# Patient Record
Sex: Female | Born: 1975 | Race: White | Hispanic: No | Marital: Married | Smoking: Never smoker
Health system: Southern US, Community
[De-identification: ages and names within clinical notes are randomized; demographics above are authoritative.]

## PROBLEM LIST (undated history)

## (undated) DIAGNOSIS — K529 Noninfective gastroenteritis and colitis, unspecified: Secondary | ICD-10-CM

## (undated) DIAGNOSIS — R079 Chest pain, unspecified: Secondary | ICD-10-CM

## (undated) DIAGNOSIS — F1210 Cannabis abuse, uncomplicated: Secondary | ICD-10-CM

## (undated) DIAGNOSIS — F410 Panic disorder [episodic paroxysmal anxiety] without agoraphobia: Secondary | ICD-10-CM

## (undated) DIAGNOSIS — I10 Essential (primary) hypertension: Secondary | ICD-10-CM

## (undated) DIAGNOSIS — E1169 Type 2 diabetes mellitus with other specified complication: Secondary | ICD-10-CM

## (undated) DIAGNOSIS — E785 Hyperlipidemia, unspecified: Secondary | ICD-10-CM

## (undated) HISTORY — PX: TUBAL LIGATION: SHX77

---

## 2004-02-08 ENCOUNTER — Ambulatory Visit: Payer: Self-pay | Admitting: Family Medicine

## 2004-07-01 ENCOUNTER — Encounter: Admission: RE | Admit: 2004-07-01 | Discharge: 2004-07-01 | Payer: Self-pay | Admitting: Obstetrics and Gynecology

## 2004-08-18 ENCOUNTER — Inpatient Hospital Stay (HOSPITAL_COMMUNITY): Admission: AD | Admit: 2004-08-18 | Discharge: 2004-08-18 | Payer: Self-pay | Admitting: Obstetrics and Gynecology

## 2004-08-28 ENCOUNTER — Inpatient Hospital Stay (HOSPITAL_COMMUNITY): Admission: AD | Admit: 2004-08-28 | Discharge: 2004-08-28 | Payer: Self-pay | Admitting: Obstetrics and Gynecology

## 2004-09-16 ENCOUNTER — Inpatient Hospital Stay (HOSPITAL_COMMUNITY): Admission: AD | Admit: 2004-09-16 | Discharge: 2004-09-18 | Payer: Self-pay | Admitting: Obstetrics and Gynecology

## 2004-10-16 ENCOUNTER — Other Ambulatory Visit: Admission: RE | Admit: 2004-10-16 | Discharge: 2004-10-16 | Payer: Self-pay | Admitting: Obstetrics and Gynecology

## 2004-10-21 ENCOUNTER — Ambulatory Visit (HOSPITAL_COMMUNITY): Admission: RE | Admit: 2004-10-21 | Discharge: 2004-10-21 | Payer: Self-pay | Admitting: Obstetrics and Gynecology

## 2008-06-19 ENCOUNTER — Emergency Department (HOSPITAL_COMMUNITY): Admission: EM | Admit: 2008-06-19 | Discharge: 2008-06-19 | Payer: Self-pay | Admitting: Emergency Medicine

## 2010-06-27 NOTE — H&P (Signed)
Cassandra Greer, Cassandra Greer             ACCOUNT NO.:  [id number]   MEDICAL RECORD NO.:  [id number]          PATIENT TYPE:  AMB   LOCATION:  SDC                           FACILITY:  [facility]   PHYSICIAN:  Huel Cote, M.D. DATE OF BIRTH:  [birth year]   DATE OF ADMISSION:  DATE OF DISCHARGE:                                HISTORY & PHYSICAL   DATE OF SURGERY:  October 21, 2004 at 9:30 a.m.   The patient is a 35 year old G5 P1-2-2-3 who is coming in for a scheduled  laparoscopic tubal sterilization given a desire for permanent sterility. The  patient recently in August had a spontaneous vaginal delivery and desired a  postpartum tubal ligation; however, had not signed the appropriate paperwork  for her Medicaid status and therefore is having this done as an interval  tubal ligation.   PAST MEDICAL HISTORY:  None.   PAST SURGICAL HISTORY:  None.   PAST GYNECOLOGICAL HISTORY:  None.   PAST OBSTETRICAL HISTORY:  She had an elective abortion in 1992. In 1997,  she had a vaginal delivery of a 6-pound 14-ounce infant. In 1998, she had a  vaginal delivery of a 6-pound 12-ounce infant. In 2005, she had an elective  abortion.   ALLERGIES:  None.   MEDICATIONS:  1.  Procardia XL 30 mg p.o. b.i.d.  2.  Celexa 20 mg p.o. daily.   PHYSICAL EXAMINATION:  VITAL SIGNS:  The patient's blood pressure is 138/90  on medication. She was placed on hypertensive medicines after delivery and  remains on Procardia XL 30 mg b.i.d. with adequate control of her blood  pressure, thus possible chronic borderline hypertension. Her weight is 192  pounds.  CARDIAC:  Regular rate and rhythm.  LUNGS:  Clear.  ABDOMEN:  Soft and nontender.  PELVIC:  She has normal genitalia noted. Cervix has no lesions. Uterus is  small and adnexa have no masses.   The patient was counseled as to the risks and benefits of proceeding with  tubal ligation including bleeding, infection, and possible damage to bowel  and bladder.  She understands the risk of failure of approximately 1 in 100  with this type of procedure, and understands the risks of ectopic pregnancy  is increased should she become pregnant. Given all of the above, the patient  has carefully considered and decides that she wishes to continue with  surgery.      Huel Cote, M.D.  Electronically Signed     KR/MEDQ  D:  10/17/2004  T:  10/17/2004  Job:  [id number]

## 2010-06-27 NOTE — Op Note (Signed)
NAMEALYN, Cassandra Greer             ACCOUNT NO.:  [id number]   MEDICAL RECORD NO.:  [id number]          PATIENT TYPE:  AMB   LOCATION:  SDC                           [facility]:  [facility]   PHYSICIAN:  Huel Cote, M.D. DATE OF BIRTH:  April 18, 1975   DATE OF PROCEDURE:  10/21/2004  DATE OF DISCHARGE:                                 OPERATIVE REPORT   PREOPERATIVE DIAGNOSIS:  Desires permanent sterility.   POSTOPERATIVE DIAGNOSIS:  Desires permanent sterility.   PROCEDURE:  Laparoscopic bilateral tubal ligation.   SURGEON:  Dr. Huel Cote.   ASSISTANT:  None.   ANESTHESIA:  General.   ESTIMATED BLOOD LOSS:  Minimal.   COMPLICATIONS:  None.   FINDINGS:  There was normal pelvic and abdominal anatomy noted at time of  surgery.   PROCEDURE:  The patient was taken to the operating room where general  anesthesia was obtained without difficulty. She was then prepped and draped  in the normal sterile fashion in dorsal lithotomy position. A speculum was  then placed within the vagina and the cervix identified and a Hulka  tenaculum placed within the cervix for uterine manipulation. Attention was  then turned to the patient's abdomen where a small infraumbilical incision  was made and this was injected with quarter percent Marcaine approximately  10 mL prior to incision. The Veress needle was then introduced into the  incision and intraperitoneal placement confirmed by both aspiration and  injection with normal saline. The gas flow was then applied and  pneumoperitoneum obtained with approximately to 2.5 liters CO2 gas. The  Veress needle was then removed and 10/11 trocar placed within the incision.  The camera was then introduced in the trocar and the abdomen and pelvis  carefully inspected. The tubes and ovaries appeared normal. The uterus was  normal appearing postpartum uterus. The appendix was not visualized. The  liver and gallbladder edge appeared normal and there were no  adhesions or  evidence of other pelvic pathology. At this point the tubes were cauterized  bilaterally with Kleppinger cautery and with each tube having approximately  2-3 cm segment of blanched tube noted. At this point the pelvis and abdomen  were hemostatic and the pneumoperitoneum was reduced through the 11 trocar  and the trocar removed under direct visualization. One deep suture of 0  Vicryl was placed in  the subcutaneous tissue and then the subcuticular tissue was closed with a 3-  0 Vicryl in a subcuticular stitch. Sponge, lap and needle counts were  correct x2 and the patient was taken to the recovery room after the Hulka  tenaculum was removed from her vagina in stable condition and awake.      Huel Cote, M.D.  Electronically Signed     KR/MEDQ  D:  10/21/2004  T:  10/21/2004  Job:  [id number]

## 2010-06-27 NOTE — Discharge Summary (Signed)
[name], Greer             ACCOUNT NO.:  [id number]   MEDICAL RECORD NO.:  [id number]          PATIENT TYPE:  INP   LOCATION:  9111                          [facility]ILITY:  [facility]   PHYSICIAN:  Huel Cote, M.D. DATE OF BIRTH:  12/28/1975   DATE OF ADMISSION:  09/16/2004  DATE OF DISCHARGE:                                 DISCHARGE SUMMARY   DISCHARGE DIAGNOSES:  1.  Term pregnancy at 40+ weeks delivered.  2.  Status post normal spontaneous vaginal delivery.  3.  Gestational diabetes.  4.  Some pregnancy-induced hypertension   DISCHARGE MEDICATIONS:  1.  Motrin 600 p.o. q.6h.  2.  Percocet one to two tablets p.o. q.4h. p.r.n.  3.  Procardia XL 30 mg p.o. b.i.d.   DISCHARGE FOLLOWUP:  The patient is to follow up in the office in  approximately 1 week for blood pressure check. She will also have a visiting  nurse at her home the day after discharge to check blood pressure.   HOSPITAL COURSE:  The patient is a 35 year old G5 P 0-2-2-2 who was admitted  at 69 and three-sevenths weeks for induction given a past due date. Prenatal  care been complicated by gestational diabetes, diet controlled, and some  borderline blood pressures with diastolics approaching 90. The patient did  have a history of possible borderline hypertension. She did have a history  of toxemia with her other two pregnancies and was delivered at 36 weeks  because of this. Prenatal labs were as follows:  A positive, Rh negative,  RPR nonreactive, rubella equivocal, hepatitis B surface antigen negative,  HIV declined, GC negative, chlamydia negative, group B strep negative. One-  hour Glucola 169; 3-hour was 100, 244, 139, and 91. Past obstetrical  history:  She had an elective abortion 1992. In 1997 she had normal  spontaneous vaginal delivery of a 6-pound 14-ounce infant. In 1998 she had a  vaginal delivery of a 6-pound 12-ounce infant. In 2005 she had an elective  abortion. Past gynecological history:  None.  Past medical history:  None.  Past surgical history:  None. Allergies:  None. Medications:  None. On  admission she was afebrile with stable vital signs. Fetal heart rate was  reactive. Blood pressures were 130 to 140 over 80s to 90s. Cervical exam was  75, 4, and a -2. She had rupture of membranes performed and clear fluid  noted. She then progressed quickly to complete dilation, pushed well, and  had a normal spontaneous vaginal delivery of a vigorous female infant over  intact perineum. Apgars were 8 and 9, weight was 7 pounds 3 ounces. Placenta  delivered spontaneously. She had a small periurethral laceration which was  repaired with 2-0 Vicryl in two interrupted sutures. The patient did request  a tubal ligation; however, had not signed the appropriate paperwork in  enough time. Therefore, this will be carried out as an outpatient procedure.  She was then admitted for routine postpartum care and her blood pressures  steadily  increased to diastolics of 90-100. At this point she was placed on Procardia  XL 30 mg b.i.d. and her  blood pressure responded well to this. By postpartum  day #2 in the morning she had diastolics more in the 80s and was stable for  discharge home. She was therefore discharged on her Procardia and also with  Motrin and Percocet for pain control.       KR/MEDQ  D:  09/18/2004  T:  09/18/2004  Job:  [id number]

## 2013-02-24 ENCOUNTER — Encounter (HOSPITAL_COMMUNITY): Payer: Self-pay | Admitting: Emergency Medicine

## 2013-02-24 ENCOUNTER — Emergency Department (HOSPITAL_COMMUNITY)
Admission: EM | Admit: 2013-02-24 | Discharge: 2013-02-24 | Disposition: A | Discharge status: Home / Self Care | Payer: Self-pay | Attending: Emergency Medicine | Admitting: Emergency Medicine

## 2013-02-24 DIAGNOSIS — H6092 Unspecified otitis externa, left ear: Secondary | ICD-10-CM

## 2013-02-24 DIAGNOSIS — J329 Chronic sinusitis, unspecified: Secondary | ICD-10-CM

## 2013-02-24 DIAGNOSIS — J019 Acute sinusitis, unspecified: Secondary | ICD-10-CM | POA: Insufficient documentation

## 2013-02-24 DIAGNOSIS — J3489 Other specified disorders of nose and nasal sinuses: Secondary | ICD-10-CM | POA: Insufficient documentation

## 2013-02-24 DIAGNOSIS — H60399 Other infective otitis externa, unspecified ear: Secondary | ICD-10-CM | POA: Insufficient documentation

## 2013-02-24 MED ORDER — IBUPROFEN 800 MG PO TABS
800.0000 mg | ORAL_TABLET | Freq: Once | ORAL | Status: AC
  Administered 2013-02-24: 800 mg via ORAL
  Filled 2013-02-24: qty 1

## 2013-02-24 MED ORDER — CIPROFLOXACIN-HYDROCORTISONE 0.2-1 % OT SUSP: 3.0000 [drp] | Freq: Two times a day (BID) | OTIC | Status: DC

## 2013-02-24 MED ORDER — CIPROFLOXACIN-DEXAMETHASONE 0.3-0.1 % OT SUSP
4.0000 [drp] | Freq: Once | OTIC | Status: AC
Start: 2013-02-24 — End: 2013-02-24
  Administered 2013-02-24: 4 [drp] via OTIC
  Filled 2013-02-24: qty 7.5

## 2013-02-24 MED ORDER — ANTIPYRINE-BENZOCAINE 5.4-1.4 % OT SOLN
3.0000 [drp] | Freq: Once | OTIC | Status: AC
  Administered 2013-02-24: 3 [drp] via OTIC
  Filled 2013-02-24: qty 10

## 2013-02-24 MED ORDER — AMOXICILLIN-POT CLAVULANATE 875-125 MG PO TABS: 1.0000 | ORAL_TABLET | Freq: Two times a day (BID) | ORAL | Status: DC

## 2013-02-24 MED ORDER — IBUPROFEN 800 MG PO TABS: 800.0000 mg | ORAL_TABLET | Freq: Three times a day (TID) | ORAL | Status: DC

## 2013-02-24 NOTE — Discharge Instructions (Signed)
Otitis Externa Otitis externa is a bacterial or fungal infection of the outer ear canal. This is the area from the eardrum to the outside of the ear. Otitis externa is sometimes called "swimmer's ear." CAUSES  Possible causes of infection include:  Swimming in dirty water.  Moisture remaining in the ear after swimming or bathing.  Mild injury (trauma) to the ear.  Objects stuck in the ear (foreign body).  Cuts or scrapes (abrasions) on the outside of the ear. SYMPTOMS  The first symptom of infection is often itching in the ear canal. Later signs and symptoms may include swelling and redness of the ear canal, ear pain, and yellowish-white fluid (pus) coming from the ear. The ear pain may be worse when pulling on the earlobe. DIAGNOSIS  Your caregiver will perform a physical exam. A sample of fluid may be taken from the ear and examined for bacteria or fungi. TREATMENT  Antibiotic ear drops are often given for 10 to 14 days. Treatment may also include pain medicine or corticosteroids to reduce itching and swelling. PREVENTION   Keep your ear dry. Use the corner of a towel to absorb water out of the ear canal after swimming or bathing.  Avoid scratching or putting objects inside your ear. This can damage the ear canal or remove the protective wax that lines the canal. This makes it easier for bacteria and fungi to grow.  Avoid swimming in lakes, polluted water, or poorly chlorinated pools.  You may use ear drops made of rubbing alcohol and vinegar after swimming. Combine equal parts of white vinegar and alcohol in a bottle. Put 3 or 4 drops into each ear after swimming. HOME CARE INSTRUCTIONS   Apply antibiotic ear drops to the ear canal as prescribed by your caregiver.  Only take over-the-counter or prescription medicines for pain, discomfort, or fever as directed by your caregiver.  If you have diabetes, follow any additional treatment instructions from your caregiver.  Keep all  follow-up appointments as directed by your caregiver. SEEK MEDICAL CARE IF:   You have a fever.  Your ear is still red, swollen, painful, or draining pus after 3 days.  Your redness, swelling, or pain gets worse.  You have a severe headache.  You have redness, swelling, pain, or tenderness in the area behind your ear. MAKE SURE YOU:   Understand these instructions.  Will watch your condition.  Will get help right away if you are not doing well or get worse. Document Released: 01/26/2005 Document Revised: 04/20/2011 Document Reviewed: 02/12/2011 [employer] Patient Information 2014 [employer].  

## 2013-02-24 NOTE — ED Provider Notes (Signed)
CSN: [id number]     Arrival date & time 02/24/13  0016 History   First MD Initiated Contact with Patient 02/24/13 0431     Chief Complaint  Patient presents with  . Otalgia   (Consider location/radiation/quality/duration/timing/severity/associated sxs/prior Treatment) HPI History provided by patient. This had some sinus congestion for the last 2 days and tonight at home developed severe left ear pain. Hurts to touch her ear. No drainage. No fevers. She does complain of some chills tonight. No sore throat. No neck pain or neck stiffness. Symptoms moderate severity  History reviewed. No pertinent past medical history. History reviewed. No pertinent past surgical history. History reviewed. No pertinent family history. History  Substance Use Topics  . Smoking status: Never Smoker   . Smokeless tobacco: Never Used  . Alcohol Use: No   OB History   Grav Para Term Preterm Abortions TAB SAB Ect Mult Living                 Review of Systems  Constitutional: Positive for chills.  HENT: Positive for congestion and ear pain.   Eyes: Negative for visual disturbance.  Respiratory: Negative for shortness of breath.   Cardiovascular: Negative for chest pain.  Gastrointestinal: Negative for abdominal pain.  Genitourinary: Negative for dysuria.  Musculoskeletal: Negative for neck pain and neck stiffness.  Skin: Negative for rash.  Neurological: Negative for headaches.  All other systems reviewed and are negative.    Allergies  Review of patient's allergies indicates no known allergies.  Home Medications   Current Outpatient Rx  Name  Route  Sig  Dispense  Refill  . phenylephrine (SUDAFED PE) 10 MG TABS tablet   Oral   Take 10 mg by mouth every 4 (four) hours as needed (congestion).         . SALINE NA   Left Nare   Place 1 spray into left nostril daily as needed (congestion).          BP 179/128  Pulse 80  Temp(Src) 97.6 F (36.4 C) (Oral)  Resp 18  Ht 5\' 7"  (1.702 m)   Wt 220 lb (99.791 kg)  BMI 34.45 kg/m2  SpO2 96%  LMP 02/24/2013 Physical Exam  Constitutional: She is oriented to person, place, and time. She appears well-developed and well-nourished.  HENT:  Head: Normocephalic and atraumatic.  Mouth/Throat: Oropharynx is clear and moist.  Left ear canal with swelling erythema, tenderness with any manipulation of the external ear. No obvious drainage, unable to visualize TM. Nasal congestion.   Eyes: EOM are normal. Pupils are equal, round, and reactive to light.  Neck: Normal range of motion. Neck supple.  Cardiovascular: Normal rate, regular rhythm and intact distal pulses.   Pulmonary/Chest: Effort normal and breath sounds normal. No stridor. No respiratory distress.  Abdominal: Soft. Bowel sounds are normal. She exhibits no distension. There is no tenderness.  Musculoskeletal: Normal range of motion. She exhibits no edema.  Neurological: She is alert and oriented to person, place, and time.  Skin: Skin is warm and dry.    ED Course  Procedures (including critical care time) Labs Review Labs Reviewed - No data to display Imaging Review No results found.  EKG Interpretation   None      Auralgan and Cipro otic provided.  Clinical otitis externa unable to visualize TM possible otitis media. Will treat for associated sinusitis with Augmentin.   Plan discharge home with antibiotics. Return precautions verbalizes understood.  MDM  Diagnosis: Otitis externa, sinusitis  Treated with medications as above. Vital signs nurses notes reviewed and considered   Teressa Lower, MD 02/24/13 [phone]

## 2013-02-24 NOTE — ED Notes (Addendum)
Pt c/o sore throat, left sided earache radiating to left face, cough. Left sided Facial numbness. No neuro deficits noted. No arm drift, slurred speech or facial droop

## 2013-03-05 ENCOUNTER — Inpatient Hospital Stay (HOSPITAL_COMMUNITY)
Admission: EM | Admit: 2013-03-05 | Discharge: 2013-03-10 | DRG: 392 | Disposition: A | Discharge status: Home / Self Care | Payer: Medicaid Other | Attending: Internal Medicine | Admitting: Internal Medicine

## 2013-03-05 ENCOUNTER — Emergency Department (HOSPITAL_COMMUNITY): Payer: Medicaid Other

## 2013-03-05 ENCOUNTER — Encounter (HOSPITAL_COMMUNITY): Payer: Self-pay | Admitting: Emergency Medicine

## 2013-03-05 DIAGNOSIS — R51 Headache: Secondary | ICD-10-CM

## 2013-03-05 DIAGNOSIS — K625 Hemorrhage of anus and rectum: Secondary | ICD-10-CM

## 2013-03-05 DIAGNOSIS — R519 Headache, unspecified: Secondary | ICD-10-CM

## 2013-03-05 DIAGNOSIS — K5289 Other specified noninfective gastroenteritis and colitis: Secondary | ICD-10-CM

## 2013-03-05 DIAGNOSIS — A09 Infectious gastroenteritis and colitis, unspecified: Principal | ICD-10-CM | POA: Diagnosis present

## 2013-03-05 DIAGNOSIS — R109 Unspecified abdominal pain: Secondary | ICD-10-CM

## 2013-03-05 DIAGNOSIS — Z6833 Body mass index (BMI) 33.0-33.9, adult: Secondary | ICD-10-CM

## 2013-03-05 DIAGNOSIS — E876 Hypokalemia: Secondary | ICD-10-CM

## 2013-03-05 DIAGNOSIS — Z833 Family history of diabetes mellitus: Secondary | ICD-10-CM

## 2013-03-05 DIAGNOSIS — K529 Noninfective gastroenteritis and colitis, unspecified: Secondary | ICD-10-CM | POA: Diagnosis present

## 2013-03-05 DIAGNOSIS — I10 Essential (primary) hypertension: Secondary | ICD-10-CM

## 2013-03-05 DIAGNOSIS — E8881 Metabolic syndrome: Secondary | ICD-10-CM | POA: Diagnosis present

## 2013-03-05 DIAGNOSIS — D72829 Elevated white blood cell count, unspecified: Secondary | ICD-10-CM

## 2013-03-05 DIAGNOSIS — J329 Chronic sinusitis, unspecified: Secondary | ICD-10-CM

## 2013-03-05 HISTORY — DX: Noninfective gastroenteritis and colitis, unspecified: K52.9

## 2013-03-05 LAB — TYPE AND SCREEN
ABO/RH(D): A POS
ANTIBODY SCREEN: NEGATIVE

## 2013-03-05 LAB — OCCULT BLOOD, POC DEVICE: FECAL OCCULT BLD: POSITIVE — AB

## 2013-03-05 LAB — DIFFERENTIAL
BASOS ABS: 0 10*3/uL (ref 0.0–0.1)
Basophils Relative: 0 % (ref 0–1)
EOS PCT: 0 % (ref 0–5)
Eosinophils Absolute: 0 10*3/uL (ref 0.0–0.7)
LYMPHS ABS: 2.8 10*3/uL (ref 0.7–4.0)
LYMPHS PCT: 11 % — AB (ref 12–46)
Monocytes Absolute: 1.1 10*3/uL — ABNORMAL HIGH (ref 0.1–1.0)
Monocytes Relative: 4 % (ref 3–12)
NEUTROS ABS: 20.5 10*3/uL — AB (ref 1.7–7.7)
NEUTROS PCT: 84 % — AB (ref 43–77)

## 2013-03-05 LAB — CBC
HCT: 36.6 % (ref 36.0–46.0)
HEMATOCRIT: 41.3 % (ref 36.0–46.0)
HEMOGLOBIN: 12.4 g/dL (ref 12.0–15.0)
Hemoglobin: 14.2 g/dL (ref 12.0–15.0)
MCH: 30.1 pg (ref 26.0–34.0)
MCH: 30.2 pg (ref 26.0–34.0)
MCHC: 34.4 g/dL (ref 30.0–36.0)
MCHC: 33.9 g/dL (ref 30.0–36.0)
MCV: 87.7 fL (ref 78.0–100.0)
MCV: 89.1 fL (ref 78.0–100.0)
PLATELETS: 480 10*3/uL — AB (ref 150–400)
Platelets: 351 10*3/uL (ref 150–400)
RBC: 4.71 MIL/uL (ref 3.87–5.11)
RBC: 4.11 MIL/uL (ref 3.87–5.11)
RDW: 12.9 % (ref 11.5–15.5)
RDW: 12.9 % (ref 11.5–15.5)
WBC: 24.5 10*3/uL — AB (ref 4.0–10.5)
WBC: 19.1 10*3/uL — ABNORMAL HIGH (ref 4.0–10.5)

## 2013-03-05 LAB — COMPREHENSIVE METABOLIC PANEL
ALBUMIN: 4 g/dL (ref 3.5–5.2)
ALT: 12 U/L (ref 0–35)
AST: 12 U/L (ref 0–37)
Alkaline Phosphatase: 93 U/L (ref 39–117)
BUN: 8 mg/dL (ref 6–23)
CHLORIDE: 98 meq/L (ref 96–112)
CO2: 26 mEq/L (ref 19–32)
CREATININE: 0.69 mg/dL (ref 0.50–1.10)
Calcium: 9.3 mg/dL (ref 8.4–10.5)
GFR calc Af Amer: >90 mL/min (ref >90)
GFR calc non Af Amer: >90 mL/min (ref >90)
Glucose, Bld: 96 mg/dL (ref 70–99)
Potassium: 3.6 mEq/L — ABNORMAL LOW (ref 3.7–5.3)
Sodium: 139 mEq/L (ref 137–147)
TOTAL PROTEIN: 7.8 g/dL (ref 6.0–8.3)
Total Bilirubin: 0.3 mg/dL (ref 0.3–1.2)

## 2013-03-05 LAB — LACTIC ACID, PLASMA: Lactic Acid, Venous: 1.4 mmol/L (ref 0.5–2.2)

## 2013-03-05 LAB — URINALYSIS, ROUTINE W REFLEX MICROSCOPIC
Bilirubin Urine: NEGATIVE
GLUCOSE, UA: NEGATIVE mg/dL
KETONES UR: NEGATIVE mg/dL
LEUKOCYTES UA: NEGATIVE
NITRITE: NEGATIVE
PH: 7 (ref 5.0–8.0)
Protein, ur: 100 mg/dL — AB
SPECIFIC GRAVITY, URINE: 1.018 (ref 1.005–1.030)
Urobilinogen, UA: 0.2 mg/dL (ref 0.0–1.0)

## 2013-03-05 LAB — URINE MICROSCOPIC-ADD ON

## 2013-03-05 LAB — POCT PREGNANCY, URINE: PREG TEST UR: NEGATIVE

## 2013-03-05 MED ORDER — ONDANSETRON HCL 4 MG/2ML IJ SOLN
4.0000 mg | Freq: Once | INTRAMUSCULAR | Status: AC
  Administered 2013-03-05: 4 mg via INTRAVENOUS
  Filled 2013-03-05: qty 2

## 2013-03-05 MED ORDER — SODIUM CHLORIDE 0.9 % IV SOLN
Freq: Once | INTRAVENOUS | Status: AC
  Administered 2013-03-05: 20:00:00 via INTRAVENOUS

## 2013-03-05 MED ORDER — HYDROMORPHONE HCL PF 1 MG/ML IJ SOLN
1.0000 mg | Freq: Once | INTRAMUSCULAR | Status: AC
Start: 2013-03-05 — End: 2013-03-05
  Administered 2013-03-05: 1 mg via INTRAVENOUS
  Filled 2013-03-05: qty 1

## 2013-03-05 MED ORDER — ACETAMINOPHEN 325 MG PO TABS: 650.0000 mg | ORAL_TABLET | Freq: Four times a day (QID) | ORAL | Status: DC | PRN

## 2013-03-05 MED ORDER — SODIUM CHLORIDE 0.9 % IV SOLN
80.0000 mg | Freq: Once | INTRAVENOUS | Status: AC
  Administered 2013-03-05: 80 mg via INTRAVENOUS
  Filled 2013-03-05: qty 80

## 2013-03-05 MED ORDER — SODIUM CHLORIDE 0.9 % IV BOLUS (SEPSIS)
1000.0000 mL | Freq: Once | INTRAVENOUS | Status: AC
  Administered 2013-03-05: 1000 mL via INTRAVENOUS

## 2013-03-05 MED ORDER — SODIUM CHLORIDE 0.9 % IV SOLN
INTRAVENOUS | Status: AC
  Administered 2013-03-06: 06:00:00 via INTRAVENOUS

## 2013-03-05 MED ORDER — IOHEXOL 300 MG/ML  SOLN
25.0000 mL | Freq: Once | INTRAMUSCULAR | Status: AC | PRN
  Administered 2013-03-05: 25 mL via ORAL

## 2013-03-05 MED ORDER — CIPROFLOXACIN IN D5W 400 MG/200ML IV SOLN
400.0000 mg | Freq: Two times a day (BID) | INTRAVENOUS | Status: DC
  Administered 2013-03-06 – 2013-03-10 (×9): 400 mg via INTRAVENOUS
  Filled 2013-03-05 (×11): qty 200

## 2013-03-05 MED ORDER — ONDANSETRON HCL 4 MG PO TABS: 4.0000 mg | ORAL_TABLET | Freq: Four times a day (QID) | ORAL | Status: DC | PRN

## 2013-03-05 MED ORDER — HYDROMORPHONE HCL PF 1 MG/ML IJ SOLN
1.0000 mg | Freq: Once | INTRAMUSCULAR | Status: AC
  Administered 2013-03-05: 1 mg via INTRAVENOUS
  Filled 2013-03-05: qty 1

## 2013-03-05 MED ORDER — ONDANSETRON HCL 4 MG/2ML IJ SOLN
INTRAMUSCULAR | Status: AC
  Administered 2013-03-05: 4 mg
  Filled 2013-03-05: qty 2

## 2013-03-05 MED ORDER — ONDANSETRON HCL 4 MG/2ML IJ SOLN
4.0000 mg | Freq: Four times a day (QID) | INTRAMUSCULAR | Status: DC | PRN
  Administered 2013-03-06 (×2): 4 mg via INTRAVENOUS
  Filled 2013-03-05 (×2): qty 2

## 2013-03-05 MED ORDER — METRONIDAZOLE IN NACL 5-0.79 MG/ML-% IV SOLN
500.0000 mg | Freq: Three times a day (TID) | INTRAVENOUS | Status: DC
  Administered 2013-03-05 – 2013-03-10 (×14): 500 mg via INTRAVENOUS
  Filled 2013-03-05 (×17): qty 100

## 2013-03-05 MED ORDER — CIPROFLOXACIN IN D5W 400 MG/200ML IV SOLN
400.0000 mg | Freq: Once | INTRAVENOUS | Status: AC
  Administered 2013-03-05: 400 mg via INTRAVENOUS
  Filled 2013-03-05: qty 200

## 2013-03-05 MED ORDER — IOHEXOL 300 MG/ML  SOLN
100.0000 mL | Freq: Once | INTRAMUSCULAR | Status: AC | PRN
  Administered 2013-03-05: 100 mL via INTRAVENOUS

## 2013-03-05 MED ORDER — ACETAMINOPHEN 650 MG RE SUPP: 650.0000 mg | Freq: Four times a day (QID) | RECTAL | Status: DC | PRN

## 2013-03-05 MED ORDER — METRONIDAZOLE IN NACL 5-0.79 MG/ML-% IV SOLN: 500.0000 mg | Freq: Once | INTRAVENOUS | Status: DC

## 2013-03-05 MED ORDER — HYDROMORPHONE HCL PF 1 MG/ML IJ SOLN
0.5000 mg | INTRAMUSCULAR | Status: DC | PRN
  Administered 2013-03-05 – 2013-03-08 (×16): 0.5 mg via INTRAVENOUS
  Filled 2013-03-05 (×16): qty 1

## 2013-03-05 NOTE — H&P (Signed)
[facility] History and Physical  Ozelle Brubacher [facility]:[id number] DOB: 05/11/1975 DOA: 03/05/2013  Referring physician: ER physician. PCP: No PCP Per Patient   Chief Complaint: Rectal bleeding and abdominal pain.  HPI: Cassandra Greer is a 38 y.o. female with no significant past medical history presented the ER because of rectal bleeding. Patient has had multiple bouts of bowel movements which was frankly bloody since last night. Patient also has been having crampy abdominal pain and with nausea vomiting. Patient 3 days ago had taken 2 doses of Augmentin for sinusitis. Patient did not have further doses as patient was not able to afford it. In the ER CAT scan of the abdomen shows features concerning for colitis. Patient also has significant leukocytosis. Patient otherwise is afebrile. Patient has been placed on Cipro and Flagyl and admitted for further management. Patient denies having any recent travels or come in contact with patients with diarrhea. Denies having any hamburgers recently. Has not had any previous episodes of bloody diarrhea. Denies any chest pain or shortness of breath. Patient has been having features of sinusitis and upper respiratory tract infection like symptoms for last 2 weeks. Still has running nose.  Review of Systems: As presented in the history of presenting illness, rest negative.  Past Medical History  Diagnosis Date  . Medical history non-contributory    Past Surgical History  Procedure Laterality Date  . Tubal ligation     Social History:  reports that she has never smoked. She has never used smokeless tobacco. She reports that she does not drink alcohol or use illicit drugs. Where does patient live home. Can patient participate in ADLs? Yes.  No Known Allergies  Family History:  Family History  Problem Relation Age of Onset  . Hypertension Other   . Diabetes Mellitus II Other       Prior to Admission medications   Medication Sig  Start Date End Date Taking? Authorizing Provider  amoxicillin-clavulanate (AUGMENTIN) 875-125 MG per tablet Take 1 tablet by mouth 2 (two) times daily. 02/24/13   Teressa Lower, MD  ciprofloxacin-hydrocortisone (CIPRO [facility]) otic suspension Place 3 drops into the left ear 2 (two) times daily. 02/24/13   Teressa Lower, MD  ibuprofen (ADVIL,MOTRIN) 800 MG tablet Take 1 tablet (800 mg total) by mouth 3 (three) times daily. 02/24/13   Teressa Lower, MD    Physical Exam: Filed Vitals:   03/05/13 1519 03/05/13 1623 03/05/13 1742 03/05/13 1950  BP: 170/120 161/100 146/100 137/81  Pulse: 117     Temp: 98 F (36.7 C)     Resp: 16 17 17 18   Height: 5\' 7"  (1.702 m)     Weight: 92.704 kg (204 lb 6 oz)     SpO2: 96% 96% 99% 99%     General:  Well-developed well-nourished.  Eyes: Anicteric no pallor.  ENT: No discharge from ears eyes nose mouth.  Neck: No mass felt.  Cardiovascular: S1-S2 heard.  Respiratory: No rhonchi or crepitations.  Abdomen: Soft nontender bowel sounds present. No guarding no rigidity.  Skin: No rash.  Musculoskeletal: No edema.  Psychiatric: Appears normal.  Neurologic: Alert awake oriented to time place and person. Moves all extremities.  Labs on Admission:  Basic Metabolic Panel:  Recent Labs Lab 03/05/13 1521  NA 139  K 3.6*  CL 98  CO2 26  GLUCOSE 96  BUN 8  CREATININE 0.69  CALCIUM 9.3   Liver Function Tests:  Recent Labs Lab 03/05/13 1521  AST 12  ALT 12  ALKPHOS 93  BILITOT 0.3  PROT 7.8  ALBUMIN 4.0   No results found for this basename: LIPASE, AMYLASE,  in the last 168 hours No results found for this basename: AMMONIA,  in the last 168 hours CBC:  Recent Labs Lab 03/05/13 1521  WBC 24.5*  NEUTROABS 20.5*  HGB 14.2  HCT 41.3  MCV 87.7  PLT 480*   Cardiac Enzymes: No results found for this basename: CKTOTAL, CKMB, CKMBINDEX, TROPONINI,  in the last 168 hours  BNP (last 3 results) No results found for this basename: PROBNP,  in  the last 8760 hours CBG: No results found for this basename: GLUCAP,  in the last 168 hours  Radiological Exams on Admission: Ct Abdomen Pelvis W Contrast  03/05/2013   CLINICAL DATA:  Abdominal pain, nausea and vomiting. Blood in stool per patient.  EXAM: CT ABDOMEN AND PELVIS WITH CONTRAST  TECHNIQUE: Multidetector CT imaging of the abdomen and pelvis was performed using the standard protocol following bolus administration of intravenous contrast.  CONTRAST:  132mL OMNIPAQUE IOHEXOL 300 MG/ML  SOLN  COMPARISON:  CT-ABD-PELV dated 12/08/2005 at [facility]  FINDINGS: 2 mm incidental right middle lobe pulmonary parenchymal nodule versus vessel seen on end incidentally noted on image 8. No further followup is needed for this benign appearing finding. The left lung base is clear.  Liver, gallbladder, adrenal glands, right kidney, spleen, and pancreas are normal in appearance. 9 mm left lower renal pole cortical hypodense lesion is reidentified image 41, not completely characterized but statistically most likely assessed. No ascites or lymphadenopathy.  Uterus and ovaries are normal. Normal appendix. The cecum and transverse colon appear normal. The descending colon is collapsed, but demonstrates diffuse mural edema and trace surrounding stranding, but no surrounding fluid collection or air. For example, see image 56. No ascites, free air, or lymphadenopathy. The rectum is normal in appearance. Bladder is normal. No radiopaque renal or ureteral calculus.  No acute osseous abnormality.  IMPRESSION: Long segmental descending colonic wall thickening and edema, with surrounding trace stranding. These findings are most compatible with colitis, which could represent inflammatory bowel disease such as ulcerative colitis, infectious or inflammatory colitis, and much less likely ischemia but possible given the distribution. No evidence for perforation or pericolonic abscess.   Electronically Signed   By:  Conchita Paris M.D.   On: 03/05/2013 18:53    Assessment/Plan Principal Problem:   Colitis Active Problems:   Leucocytosis   Sinusitis   1. Colitis - infectious versus inflammatory. Stool for C. difficile PCR cultures are been ordered. For now patient will be placed on IV Cipro and Flagyl. If patient turns out to be C. difficile positive then patient may be treated on oral vancomycin given significant leukocytosis. Continue with hydration. Closely follow CBC secondary to bloody diarrhea. Closely follow renal function. Patient's blood work does not show any thrombocytopenia. I have also place patient on when necessary pain relief medications. Ibuprofen is mention in patient's medication list though patient denies having taken any NSAIDs. 2. Leukocytosis - probably secondary to colitis. Closely follow CBC with differentials. See #1. 3. Sinusitis - since patient has been having upper respiratory tract infection-like symptoms I have ordered includes a PCR.    Code Status: Full code.  Family Communication: None.  Disposition Plan: Admit to inpatient.    Wilfrido Luedke N. [facility] Pager [phone].  If 7PM-7AM, please contact night-coverage [email or web address] Password [facility] 03/05/2013, 8:37 PM

## 2013-03-05 NOTE — ED Notes (Signed)
Pt here from home with c/o abd pain and along with n/v and 5 episodes of passing large clots in her stool

## 2013-03-05 NOTE — ED Notes (Signed)
CT paged. 

## 2013-03-05 NOTE — ED Notes (Signed)
Patient transported to CT 

## 2013-03-05 NOTE — ED Provider Notes (Signed)
CSN: [id number]     Arrival date & time 03/05/13  1510 History   First MD Initiated Contact with Patient 03/05/13 1530     Chief Complaint  Patient presents with  . Rectal Bleeding   (Consider location/radiation/quality/duration/timing/severity/associated sxs/prior Treatment) Patient is a 38 y.o. female presenting with hematochezia.  Rectal Bleeding  38 yo female presents with acute onset lower abdominal pain that started last night around 11pm . Patient states pain woke her from her sleep. Pain described as sharp, intermittent pain that "comes in waves". Pain is 10/10. Patient states she had associated N/V x 4-5 episodes with 5 episodes of passing "dark red blood clots' per rectum. Patient states she had some pizza last night before bed. Admits to a hx of hemorrhoids in past but denies any rectal pain currently. Patient admits to taking IBuprofen 800mg  over the past week. Denies any chronic medications. Admits to hx of heartburn/reflux but has never been diagnosed. Patient denies hx of PUD. Denies any abdominal surgeries. Does not smoke or drink. LMP was 02/24/13. Past Medical History  Diagnosis Date  . Medical history non-contributory    Past Surgical History  Procedure Laterality Date  . Tubal ligation     Family History  Problem Relation Age of Onset  . Hypertension Other   . Diabetes Mellitus II Other    History  Substance Use Topics  . Smoking status: Never Smoker   . Smokeless tobacco: Never Used  . Alcohol Use: No   OB History   Grav Para Term Preterm Abortions TAB SAB Ect Mult Living                 Review of Systems  Gastrointestinal: Positive for hematochezia. Negative for diarrhea and constipation.  Musculoskeletal: Positive for back pain.  All other systems reviewed and are negative.    Allergies  Review of patient's allergies indicates no known allergies.  Home Medications   No current outpatient prescriptions on file. BP 148/90  Pulse 60  Temp(Src)  98.4 F (36.9 C) (Oral)  Resp 18  Ht 5\' 7"  (1.702 m)  Wt 213 lb 9.6 oz (96.888 kg)  BMI 33.45 kg/m2  SpO2 98%  LMP 02/24/2013 Physical Exam  Nursing note and vitals reviewed. Constitutional: She is oriented to person, place, and time. She appears well-developed and well-nourished. No distress.  HENT:  Head: Normocephalic and atraumatic.  Eyes: Conjunctivae and EOM are normal.  Cardiovascular: Normal rate and regular rhythm.  Exam reveals no gallop and no friction rub.   No murmur heard. Pulmonary/Chest: Effort normal and breath sounds normal. No respiratory distress. She has no wheezes. She has no rales.  Abdominal: Soft. Bowel sounds are normal. She exhibits no distension. There is no hepatosplenomegaly. There is generalized tenderness (worse with palpation of lower abdomen. ). There is no rigidity, no rebound, no guarding, no tenderness at McBurney's point and negative Murphy's sign.  Genitourinary: Rectal exam shows no external hemorrhoid, no internal hemorrhoid, no mass and anal tone normal. Guaiac positive stool.  Musculoskeletal: Normal range of motion. She exhibits no edema.  Neurological: She is alert and oriented to person, place, and time.  Skin: Skin is warm and dry. She is not diaphoretic.  Psychiatric: She has a normal mood and affect. Her behavior is normal.    ED Course  Procedures (including critical care time) Labs Review Labs Reviewed  CBC - Abnormal; Notable for the following:    WBC 24.5 (*)    Platelets 480 (*)  All other components within normal limits  COMPREHENSIVE METABOLIC PANEL - Abnormal; Notable for the following:    Potassium 3.6 (*)    All other components within normal limits  URINALYSIS, ROUTINE W REFLEX MICROSCOPIC - Abnormal; Notable for the following:    APPearance CLOUDY (*)    Hgb urine dipstick MODERATE (*)    Protein, ur 100 (*)    All other components within normal limits  DIFFERENTIAL - Abnormal; Notable for the following:     Neutrophils Relative % 84 (*)    Neutro Abs 20.5 (*)    Lymphocytes Relative 11 (*)    Monocytes Absolute 1.1 (*)    All other components within normal limits  URINE MICROSCOPIC-ADD ON - Abnormal; Notable for the following:    Squamous Epithelial / LPF MANY (*)    Bacteria, UA FEW (*)    All other components within normal limits  CBC - Abnormal; Notable for the following:    WBC 19.1 (*)    All other components within normal limits  COMPREHENSIVE METABOLIC PANEL - Abnormal; Notable for the following:    Glucose, Bld 106 (*)    Albumin 3.3 (*)    All other components within normal limits  CBC WITH DIFFERENTIAL - Abnormal; Notable for the following:    WBC 13.9 (*)    Neutrophils Relative % 78 (*)    Neutro Abs 10.9 (*)    All other components within normal limits  CBC - Abnormal; Notable for the following:    WBC 13.2 (*)    All other components within normal limits  CBC - Abnormal; Notable for the following:    WBC 12.6 (*)    All other components within normal limits  COMPREHENSIVE METABOLIC PANEL - Abnormal; Notable for the following:    Potassium 3.4 (*)    BUN 5 (*)    Albumin 3.4 (*)    All other components within normal limits  CBC - Abnormal; Notable for the following:    WBC 14.5 (*)    All other components within normal limits  OCCULT BLOOD, POC DEVICE - Abnormal; Notable for the following:    Fecal Occult Bld POSITIVE (*)    All other components within normal limits  CLOSTRIDIUM DIFFICILE BY PCR  STOOL CULTURE  INFLUENZA PANEL BY PCR (TYPE A & B, H1N1)  LACTIC ACID, PLASMA  PROTIME-INR  CBC  POCT PREGNANCY, URINE  TYPE AND SCREEN  ABO/RH   Imaging Review No results found.  EKG Interpretation    Date/Time:    Ventricular Rate:    PR Interval:    QRS Duration:   QT Interval:    QTC Calculation:   R Axis:     Text Interpretation:              MDM   1. Abdominal pain   2. Rectal bleeding   3. Colitis   4. Leucocytosis   5. Sinusitis      Hemoccult positive. Leukocytosis to 24.5 Thrombocytosis at 480 UA shows moderate Hgb, proteinuria, with few bacteria. Urine appears contaminated with many epithelial cells.  Urine Preg negative.   CT abd/pelvis shows findings consistent with colitis. No evidence or perforation of pericolonic abscess. Patient started on IV fluids and IV antibiotics.   Patient discussed with Dr. Alfonzo Beers. Plan to admit patient.   Meds given in ED:  Medications  metroNIDAZOLE (FLAGYL) IVPB 500 mg (500 mg Intravenous Given 03/07/13 2020)  acetaminophen (TYLENOL) tablet 650 mg (not administered)  Or  acetaminophen (TYLENOL) suppository 650 mg (not administered)  0.9 %  sodium chloride infusion ( Intravenous Rate/Dose Change 03/06/13 1500)  HYDROmorphone (DILAUDID) injection 0.5 mg (0.5 mg Intravenous Given 03/07/13 2014)  ciprofloxacin (CIPRO) IVPB 400 mg (400 mg Intravenous Given 03/07/13 2228)  ondansetron (ZOFRAN) tablet 4 mg ( Oral See Alternative 03/07/13 2014)    Or  ondansetron (ZOFRAN) injection 4 mg (4 mg Intravenous Given 03/07/13 2014)  ondansetron (ZOFRAN) injection 4 mg (not administered)  sodium chloride 0.9 % bolus 1,000 mL (0 mLs Intravenous Stopped 03/05/13 1918)  HYDROmorphone (DILAUDID) injection 1 mg (1 mg Intravenous Given 03/05/13 1652)  ondansetron (ZOFRAN) injection 4 mg (4 mg Intravenous Given 03/05/13 1652)  iohexol (OMNIPAQUE) 300 MG/ML solution 25 mL (25 mLs Oral Contrast Given 03/05/13 1654)  pantoprazole (PROTONIX) 80 mg in sodium chloride 0.9 % 100 mL IVPB (0 mg Intravenous Stopped 03/05/13 1918)  HYDROmorphone (DILAUDID) injection 1 mg (1 mg Intravenous Given 03/05/13 1912)  iohexol (OMNIPAQUE) 300 MG/ML solution 100 mL (100 mLs Intravenous Contrast Given 03/05/13 1821)  ondansetron (ZOFRAN) injection 4 mg (4 mg Intravenous Given 03/05/13 1918)  ciprofloxacin (CIPRO) IVPB 400 mg (400 mg Intravenous New Bag/Given 03/05/13 1949)  0.9 %  sodium chloride infusion ( Intravenous  New Bag/Given 03/05/13 1949)  ondansetron (ZOFRAN) 4 MG/2ML injection (4 mg  Given 03/05/13 2113)    Current Discharge Medication List          Sherrie George, PA-C 03/07/13 [place], PA-C 03/07/13 2244

## 2013-03-05 NOTE — Progress Notes (Signed)
ANTIBIOTIC CONSULT NOTE - INITIAL  Pharmacy Consult for Cipro Indication: colitis  No Known Allergies  Patient Measurements: Height: 5\' 7"  (170.2 cm) Weight: 213 lb 9.6 oz (96.888 kg) IBW/kg (Calculated) : 61.6   Vital Signs: Temp: 97.6 F (36.4 C) (01/25 2109) Temp src: Oral (01/25 2109) BP: 149/98 mmHg (01/25 2109) Pulse Rate: 55 (01/25 2109) Intake/Output from previous day:   Intake/Output from this shift:    Labs:  Recent Labs  03/05/13 1521  WBC 24.5*  HGB 14.2  PLT 480*  CREATININE 0.69   Estimated Creatinine Clearance: 116.2 ml/min (by C-G formula based on Cr of 0.69). No results found for this basename: VANCOTROUGH, VANCOPEAK, VANCORANDOM, GENTTROUGH, GENTPEAK, GENTRANDOM, TOBRATROUGH, TOBRAPEAK, TOBRARND, AMIKACINPEAK, AMIKACINTROU, AMIKACIN,  in the last 72 hours   Microbiology: No results found for this or any previous visit (from the past 720 hour(s)).  Medical History: Past Medical History  Diagnosis Date  . Medical history non-contributory     Assessment: 37yof admitted with abdominal pain - abx for colitis.  CrCl > 156ml/min   Plan:  Cipro 400mg  bid  Bonnita Nasuti Pharm.D. CPP, BCPS Clinical Pharmacist [phone] 03/05/2013 9:19 PM

## 2013-03-06 DIAGNOSIS — R109 Unspecified abdominal pain: Secondary | ICD-10-CM

## 2013-03-06 LAB — CBC WITH DIFFERENTIAL/PLATELET
BASOS PCT: 0 % (ref 0–1)
Basophils Absolute: 0 10*3/uL (ref 0.0–0.1)
Eosinophils Absolute: 0.2 10*3/uL (ref 0.0–0.7)
Eosinophils Relative: 1 % (ref 0–5)
HCT: 37.7 % (ref 36.0–46.0)
HEMOGLOBIN: 12.6 g/dL (ref 12.0–15.0)
LYMPHS ABS: 2.1 10*3/uL (ref 0.7–4.0)
Lymphocytes Relative: 15 % (ref 12–46)
MCH: 30 pg (ref 26.0–34.0)
MCHC: 33.4 g/dL (ref 30.0–36.0)
MCV: 89.8 fL (ref 78.0–100.0)
Monocytes Absolute: 0.8 10*3/uL (ref 0.1–1.0)
Monocytes Relative: 6 % (ref 3–12)
NEUTROS PCT: 78 % — AB (ref 43–77)
Neutro Abs: 10.9 10*3/uL — ABNORMAL HIGH (ref 1.7–7.7)
Platelets: 322 10*3/uL (ref 150–400)
RBC: 4.2 MIL/uL (ref 3.87–5.11)
RDW: 13.1 % (ref 11.5–15.5)
WBC: 13.9 10*3/uL — AB (ref 4.0–10.5)

## 2013-03-06 LAB — COMPREHENSIVE METABOLIC PANEL
ALT: 9 U/L (ref 0–35)
AST: 12 U/L (ref 0–37)
Albumin: 3.3 g/dL — ABNORMAL LOW (ref 3.5–5.2)
Alkaline Phosphatase: 79 U/L (ref 39–117)
BUN: 6 mg/dL (ref 6–23)
CALCIUM: 8.5 mg/dL (ref 8.4–10.5)
CO2: 27 mEq/L (ref 19–32)
CREATININE: 0.72 mg/dL (ref 0.50–1.10)
Chloride: 103 mEq/L (ref 96–112)
GLUCOSE: 106 mg/dL — AB (ref 70–99)
Potassium: 4.1 mEq/L (ref 3.7–5.3)
Sodium: 141 mEq/L (ref 137–147)
Total Bilirubin: 0.3 mg/dL (ref 0.3–1.2)
Total Protein: 6.6 g/dL (ref 6.0–8.3)

## 2013-03-06 LAB — CBC
HCT: 36.6 % (ref 36.0–46.0)
Hemoglobin: 12.4 g/dL (ref 12.0–15.0)
MCH: 30.2 pg (ref 26.0–34.0)
MCHC: 33.9 g/dL (ref 30.0–36.0)
MCV: 89.1 fL (ref 78.0–100.0)
Platelets: 318 10*3/uL (ref 150–400)
RBC: 4.11 MIL/uL (ref 3.87–5.11)
RDW: 13 % (ref 11.5–15.5)
WBC: 13.2 10*3/uL — ABNORMAL HIGH (ref 4.0–10.5)

## 2013-03-06 LAB — INFLUENZA PANEL BY PCR (TYPE A & B)
H1N1FLUPCR: NOT DETECTED
INFLAPCR: NEGATIVE
INFLBPCR: NEGATIVE

## 2013-03-06 LAB — ABO/RH: ABO/RH(D): A POS

## 2013-03-06 MED ORDER — ONDANSETRON HCL 4 MG/2ML IJ SOLN
4.0000 mg | INTRAMUSCULAR | Status: DC
  Administered 2013-03-06 – 2013-03-09 (×13): 4 mg via INTRAVENOUS
  Filled 2013-03-06 (×13): qty 2

## 2013-03-06 MED ORDER — ONDANSETRON HCL 4 MG/2ML IJ SOLN
4.0000 mg | Freq: Four times a day (QID) | INTRAMUSCULAR | Status: DC | PRN
  Administered 2013-03-08 (×3): 4 mg via INTRAVENOUS
  Filled 2013-03-06 (×2): qty 2

## 2013-03-06 MED ORDER — ONDANSETRON HCL 4 MG PO TABS
4.0000 mg | ORAL_TABLET | ORAL | Status: DC
  Administered 2013-03-06 – 2013-03-10 (×6): 4 mg via ORAL
  Filled 2013-03-06 (×25): qty 1

## 2013-03-06 NOTE — Progress Notes (Signed)
Note: This document was prepared with digital dictation and possible smart phrase technology. Any transcriptional errors that result from this process are unintentional.   Cassandra Greer HBZ:[id number] DOB: December 12, 1975 DOA: 03/05/2013 PCP: No PCP Per Patient  Brief narrative: 38 y.o. female with no significant past medical history presented the ER because of rectal bleeding. Patient has had multiple bouts of bowel movements which was frankly bloody since last night. Patient also has been having crampy abdominal pain and with nausea vomiting. Patient 3 days ago had taken 2 doses of Augmentin for sinusitis.  Tells me ha a "lot of pizza  From [employer] 1/24-had a [employer]" .  None of her 3 kids were sick after eating this meal.  She had nuemrous amounts "dark stools"  She had her last period 02/24/13 and is off that now.  The stool is not mixed c blood-she states it is blood    Past medical history-As per Problem list Chart reviewed as below  Consultants: none  Procedures:  CT Abd Pelv  Antibiotics:  Cipro 1/25  Flagyll 1/25   Subjective  Feels better.  No N About to eat clears. Some dark stool today.  No CP.   Objective    Interim History: none  Telemetry: none   Objective: Filed Vitals:   03/05/13 1742 03/05/13 1950 03/05/13 2109 03/06/13 0525  BP: 146/100 137/81 149/98 175/108  Pulse:   55 63  Temp:   97.6 F (36.4 C) 97.7 F (36.5 C)  TempSrc:   Oral   Resp: 17 18 19 20   Height:   5\' 7"  (1.702 m)   Weight:   96.888 kg (213 lb 9.6 oz)   SpO2: 99% 99% 97% 92%    Intake/Output Summary (Last 24 hours) at 03/06/13 1315 Last data filed at 03/06/13 0930  Gross per 24 hour  Intake     60 ml  Output      0 ml  Net     60 ml    Exam:  General: eomi, ncat Cardiovascular: s1 s2 no m/r/g Respiratory: cta b Abdomen: soft,  Slightly tender Skin no Le edema Neuro intact  Data Reviewed: Basic Metabolic Panel:  Recent Labs Lab 03/05/13 1521  03/06/13 0445  NA 139 141  K 3.6* 4.1  CL 98 103  CO2 26 27  GLUCOSE 96 106*  BUN 8 6  CREATININE 0.69 0.72  CALCIUM 9.3 8.5   Liver Function Tests:  Recent Labs Lab 03/05/13 1521 03/06/13 0445  AST 12 12  ALT 12 9  ALKPHOS 93 79  BILITOT 0.3 0.3  PROT 7.8 6.6  ALBUMIN 4.0 3.3*   No results found for this basename: LIPASE, AMYLASE,  in the last 168 hours No results found for this basename: AMMONIA,  in the last 168 hours CBC:  Recent Labs Lab 03/05/13 1521 03/05/13 2138 03/06/13 0445  WBC 24.5* 19.1* 13.9*  NEUTROABS 20.5*  --  10.9*  HGB 14.2 12.4 12.6  HCT 41.3 36.6 37.7  MCV 87.7 89.1 89.8  PLT 480* 351 322   Cardiac Enzymes: No results found for this basename: CKTOTAL, CKMB, CKMBINDEX, TROPONINI,  in the last 168 hours BNP: No components found with this basename: POCBNP,  CBG: No results found for this basename: GLUCAP,  in the last 168 hours  No results found for this or any previous visit (from the past 240 hour(s)).   Studies:  All Imaging reviewed and is as per above notation   Scheduled Meds: . ciprofloxacin  400 mg Intravenous Q12H  . metronidazole  500 mg Intravenous Q8H   Continuous Infusions: . sodium chloride 125 mL/hr at 03/06/13 0558   acetaminophen, acetaminophen, HYDROmorphone (DILAUDID) injection, ondansetron (ZOFRAN) IV, ondansetron   Assessment/Plan: 1. Potential infectious colitis-Cont empiric cipro/flagyll.  CBC + Diff am.  Clears.  Shecueld zofran for N/v  Continue Pain meds-Dark stools likely not bleeding.  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

## 2013-03-07 LAB — CBC
HCT: 38.6 % (ref 36.0–46.0)
HEMATOCRIT: 37.8 % (ref 36.0–46.0)
Hemoglobin: 12.9 g/dL (ref 12.0–15.0)
Hemoglobin: 13 g/dL (ref 12.0–15.0)
MCH: 29.7 pg (ref 26.0–34.0)
MCH: 30.2 pg (ref 26.0–34.0)
MCHC: 33.4 g/dL (ref 30.0–36.0)
MCHC: 34.4 g/dL (ref 30.0–36.0)
MCV: 88.7 fL (ref 78.0–100.0)
MCV: 87.7 fL (ref 78.0–100.0)
PLATELETS: 352 10*3/uL (ref 150–400)
Platelets: 326 10*3/uL (ref 150–400)
RBC: 4.35 MIL/uL (ref 3.87–5.11)
RBC: 4.31 MIL/uL (ref 3.87–5.11)
RDW: 12.8 % (ref 11.5–15.5)
RDW: 12.8 % (ref 11.5–15.5)
WBC: 12.6 10*3/uL — AB (ref 4.0–10.5)
WBC: 14.5 10*3/uL — AB (ref 4.0–10.5)

## 2013-03-07 LAB — COMPREHENSIVE METABOLIC PANEL
ALT: 10 U/L (ref 0–35)
AST: 10 U/L (ref 0–37)
Albumin: 3.4 g/dL — ABNORMAL LOW (ref 3.5–5.2)
Alkaline Phosphatase: 78 U/L (ref 39–117)
BUN: 5 mg/dL — ABNORMAL LOW (ref 6–23)
CALCIUM: 8.5 mg/dL (ref 8.4–10.5)
CHLORIDE: 101 meq/L (ref 96–112)
CO2: 27 mEq/L (ref 19–32)
Creatinine, Ser: 0.71 mg/dL (ref 0.50–1.10)
GFR calc Af Amer: >90 mL/min (ref >90)
Glucose, Bld: 95 mg/dL (ref 70–99)
Potassium: 3.4 mEq/L — ABNORMAL LOW (ref 3.7–5.3)
SODIUM: 141 meq/L (ref 137–147)
Total Bilirubin: 0.3 mg/dL (ref 0.3–1.2)
Total Protein: 6.7 g/dL (ref 6.0–8.3)

## 2013-03-07 LAB — PROTIME-INR
INR: 1.07 (ref 0.00–1.49)
Prothrombin Time: 13.7 seconds (ref 11.6–15.2)

## 2013-03-07 NOTE — Progress Notes (Signed)
Note: This document was prepared with digital dictation and possible smart phrase technology. Any transcriptional errors that result from this process are unintentional.   Cassandra Greer [id number]:[id number] DOB: 12/17/1975 DOA: 03/05/2013 PCP: No PCP Per Patient  Brief narrative: 38 y.o. female with no significant past medical history presented the ER because of rectal bleeding. Patient has had multiple bouts of bowel movements which was frankly bloody since last night. Patient also has been having crampy abdominal pain and with nausea vomiting. Patient 3 days ago had taken 2 doses of Augmentin for sinusitis.  Tells me ha a "lot of pizza  From [employer] 1/24-had a [employer]" .  None of her 3 kids were sick after eating this meal.  She had nuemrous amounts "dark stools"  She had her last period 02/24/13 and is off that now.  The stool is not mixed c blood-she states it is blood    Past medical history-As per Problem list Chart reviewed as below  Consultants: none  Procedures:  CT Abd Pelv  Antibiotics:  Cipro 1/25  Flagyll 1/25   Subjective   Nauseous this am with ambulation t RR. Vomited small amount Resting now Abd pain improved--allows me to palpate now State [place] 5/10, crampy   Objective    Interim History: none  Telemetry: none   Objective: Filed Vitals:   03/06/13 0525 03/06/13 1358 03/06/13 2112 03/07/13 0554  BP: 175/108 156/93 153/102 128/89  Pulse: 63 60 66 62  Temp: 97.7 F (36.5 C) 98.6 F (37 C) 97.9 F (36.6 C) 97.7 F (36.5 C)  TempSrc:  Oral Oral Oral  Resp: 20 18 18 18   Height:      Weight:      SpO2: 92% 97% 96% 98%    Intake/Output Summary (Last 24 hours) at 03/07/13 1206 Last data filed at 03/07/13 0500  Gross per 24 hour  Intake    440 ml  Output      0 ml  Net    440 ml    Exam:  General: eomi, ncat Cardiovascular: s1 s2 no m/r/g Respiratory: cta b Abdomen: soft,  Slightly tender bilat lower quadrant-no  rebound Skin no Le edema Neuro intact  Data Reviewed: Basic Metabolic Panel:  Recent Labs Lab 03/05/13 1521 03/06/13 0445 03/07/13 0446  NA 139 141 141  K 3.6* 4.1 3.4*  CL 98 103 101  CO2 26 27 27   GLUCOSE 96 106* 95  BUN 8 6 5*  CREATININE 0.69 0.72 0.71  CALCIUM 9.3 8.5 8.5   Liver Function Tests:  Recent Labs Lab 03/05/13 1521 03/06/13 0445 03/07/13 0446  AST 12 12 10   ALT 12 9 10   ALKPHOS 93 79 78  BILITOT 0.3 0.3 0.3  PROT 7.8 6.6 6.7  ALBUMIN 4.0 3.3* 3.4*   No results found for this basename: LIPASE, AMYLASE,  in the last 168 hours No results found for this basename: AMMONIA,  in the last 168 hours CBC:  Recent Labs Lab 03/05/13 1521 03/05/13 2138 03/06/13 0445 03/06/13 1715 03/07/13 0446  WBC 24.5* 19.1* 13.9* 13.2* 12.6*  NEUTROABS 20.5*  --  10.9*  --   --   HGB 14.2 12.4 12.6 12.4 12.9  HCT 41.3 36.6 37.7 36.6 38.6  MCV 87.7 89.1 89.8 89.1 88.7  PLT 480* 351 322 318 352   Cardiac Enzymes: No results found for this basename: CKTOTAL, CKMB, CKMBINDEX, TROPONINI,  in the last 168 hours BNP: No components found with this basename: POCBNP,  CBG: No results found for this basename: GLUCAP,  in the last 168 hours  No results found for this or any previous visit (from the past 240 hour(s)).   Studies:              All Imaging reviewed and is as per above notation   Scheduled Meds: . ciprofloxacin  400 mg Intravenous Q12H  . metronidazole  500 mg Intravenous Q8H  . ondansetron  4 mg Oral Q4H   Or  . ondansetron (ZOFRAN) IV  4 mg Intravenous Q4H   Continuous Infusions:   acetaminophen, acetaminophen, HYDROmorphone (DILAUDID) injection, ondansetron   Assessment/Plan: 1. Potential infectious colitis-Cont empiric cipro/flagyll.  CBC + Diff am.  Clears.  Shecueld zofran for N/v  Continue Pain meds  Blood count stable ?wouldn't work up for GI bleed-anticiapte needs 1-2 more days IV abx prior to feeling well enough to eat, ambulate and finally  go home 2. Morbid obesity, Body mass index is 33.45 kg/(m^2).-consider OP weight loss reduction strategies 3. 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

## 2013-03-08 DIAGNOSIS — I10 Essential (primary) hypertension: Secondary | ICD-10-CM

## 2013-03-08 DIAGNOSIS — R51 Headache: Secondary | ICD-10-CM

## 2013-03-08 DIAGNOSIS — E876 Hypokalemia: Secondary | ICD-10-CM

## 2013-03-08 DIAGNOSIS — R519 Headache, unspecified: Secondary | ICD-10-CM

## 2013-03-08 LAB — CBC
HCT: 39.9 % (ref 36.0–46.0)
HEMATOCRIT: 38.6 % (ref 36.0–46.0)
HEMOGLOBIN: 13.1 g/dL (ref 12.0–15.0)
Hemoglobin: 13.8 g/dL (ref 12.0–15.0)
MCH: 29.7 pg (ref 26.0–34.0)
MCH: 30.6 pg (ref 26.0–34.0)
MCHC: 33.9 g/dL (ref 30.0–36.0)
MCHC: 34.6 g/dL (ref 30.0–36.0)
MCV: 87.5 fL (ref 78.0–100.0)
MCV: 88.5 fL (ref 78.0–100.0)
PLATELETS: 346 10*3/uL (ref 150–400)
Platelets: 314 10*3/uL (ref 150–400)
RBC: 4.41 MIL/uL (ref 3.87–5.11)
RBC: 4.51 MIL/uL (ref 3.87–5.11)
RDW: 12.8 % (ref 11.5–15.5)
RDW: 12.8 % (ref 11.5–15.5)
WBC: 13 10*3/uL — ABNORMAL HIGH (ref 4.0–10.5)
WBC: 14.1 10*3/uL — ABNORMAL HIGH (ref 4.0–10.5)

## 2013-03-08 LAB — POTASSIUM: Potassium: 3.2 mEq/L — ABNORMAL LOW (ref 3.7–5.3)

## 2013-03-08 MED ORDER — PROMETHAZINE HCL 25 MG/ML IJ SOLN
12.5000 mg | Freq: Once | INTRAMUSCULAR | Status: AC
  Administered 2013-03-08: 12.5 mg via INTRAVENOUS
  Filled 2013-03-08: qty 1

## 2013-03-08 MED ORDER — HYDROMORPHONE HCL PF 1 MG/ML IJ SOLN
0.5000 mg | Freq: Once | INTRAMUSCULAR | Status: AC
  Administered 2013-03-08: 0.5 mg via INTRAVENOUS
  Filled 2013-03-08: qty 1

## 2013-03-08 MED ORDER — PROMETHAZINE HCL 25 MG/ML IJ SOLN: 12.5000 mg | Freq: Once | INTRAMUSCULAR | Status: DC

## 2013-03-08 MED ORDER — HYDROMORPHONE HCL 2 MG PO TABS
1.0000 mg | ORAL_TABLET | ORAL | Status: DC | PRN
  Administered 2013-03-08 – 2013-03-10 (×6): 1 mg via ORAL
  Filled 2013-03-08 (×6): qty 1

## 2013-03-08 MED ORDER — HYDRALAZINE HCL 20 MG/ML IJ SOLN: 10.0000 mg | Freq: Four times a day (QID) | INTRAMUSCULAR | Status: DC | PRN

## 2013-03-08 MED ORDER — POTASSIUM CHLORIDE CRYS ER 20 MEQ PO TBCR
40.0000 meq | EXTENDED_RELEASE_TABLET | Freq: Once | ORAL | Status: AC
  Administered 2013-03-08: 40 meq via ORAL
  Filled 2013-03-08: qty 2

## 2013-03-08 NOTE — Progress Notes (Signed)
[facility]                                                                              Patient Demographics  Cassandra Greer, is a 38 y.o. female, DOB - [birth year], [id number]  Admit date - 03/05/2013   Admitting Physician Rise Patience, MD  Outpatient Primary MD for the patient is No PCP Per Patient  LOS - 3   Chief Complaint  Patient presents with  . Rectal Bleeding        Assessment & Plan   Potential infectious colitis -Continue empiric cipro/flagyl -Leukocytosis trending downward, patient afebrile -Continue liquid diet, advance as tolerated -Continue pain control as well as antiemetics   Uncontrolled Hypertension -On no home medications, maybe secondary to pain -Will add hydralazine IV q6PRN  Hypokalemia -Pending repeat potassium level, will replete if needed -Possibly secondary to diarrhea  Headache -Likely secondary to uncontrolled HTN -Will continue to monitor  Morbid obesity, Body mass index is 33.45 kg/(m^2). -consider outpatient weight loss reduction strategies   Metabolic syndrome X -consider outpatient workup: Hba1c, Lipid panel   Code Status: Full  Family Communication: None at bedside  Disposition Plan: Admitted  Time Spent in minutes   30 minutes  Procedures None  Consults  None  DVT Prophylaxis SCDs   Lab Results  Component Value Date   PLT 314 03/08/2013    Medications  Scheduled Meds: . ciprofloxacin  400 mg Intravenous Q12H  . metronidazole  500 mg Intravenous Q8H  . ondansetron  4 mg Oral Q4H   Or  . ondansetron (ZOFRAN) IV  4 mg Intravenous Q4H   Continuous Infusions:  PRN Meds:.acetaminophen, acetaminophen, HYDROmorphone (DILAUDID) injection, ondansetron  Antibiotics    Anti-infectives   Start     Dose/Rate Route Frequency Ordered Stop   03/05/13 2200  ciprofloxacin (CIPRO) IVPB 400 mg     400 mg 200 mL/hr over 60 Minutes Intravenous Every 12 hours 03/05/13 2116     03/05/13 2100   metroNIDAZOLE (FLAGYL) IVPB 500 mg     500 mg 100 mL/hr over 60 Minutes Intravenous Every 8 hours 03/05/13 2059     03/05/13 1930  ciprofloxacin (CIPRO) IVPB 400 mg     400 mg 200 mL/hr over 60 Minutes Intravenous  Once 03/05/13 1920 03/05/13 2049   03/05/13 1930  metroNIDAZOLE (FLAGYL) IVPB 500 mg  Status:  Discontinued     500 mg 100 mL/hr over 60 Minutes Intravenous  Once 03/05/13 1920 03/05/13 2214        Subjective:   Cassandra Greer seen and examined today.  Patient no longer complains of diarrhea/ nausea or vomiting.  She does complain of frontal headache that is sharp and pulsating.     Objective:   Filed Vitals:   03/07/13 0554 03/07/13 1427 03/07/13 2157 03/08/13 0611  BP: 128/89 158/103 148/90 186/104  Pulse: 62 68 60 72  Temp: 97.7 F (36.5 C) 98.2 F (36.8 C) 98.4 F (36.9 C) 97.7 F (36.5 C)  TempSrc: Oral Oral Oral Oral  Resp: 18 18 18 16   Height:      Weight:      SpO2: 98% 98% 98% 94%    Wt  Readings from Last 3 Encounters:  03/05/13 96.888 kg (213 lb 9.6 oz)  02/24/13 99.791 kg (220 lb)     Intake/Output Summary (Last 24 hours) at 03/08/13 2703 Last data filed at 03/07/13 1847  Gross per 24 hour  Intake    240 ml  Output      0 ml  Net    240 ml    Exam  General: Well developed, well nourished, NAD, appears stated age  HEENT: NCAT, PERRLA, EOMI, Anicteic Sclera, mucous membranes moist. No pharyngeal erythema or exudates  Neck: Supple, no JVD, no masses  Cardiovascular: S1 S2 auscultated, no rubs, murmurs or gallops. Regular rate and rhythm.  Respiratory: Clear to auscultation bilaterally with equal chest rise  Abdomen: Soft, mild tenderness in lower quadrants, nondistended, + bowel sounds  Extremities: warm dry without cyanosis clubbing or edema  Neuro: AAOx3, cranial nerves grossly intact. Strength 5/5 in patient's upper and lower extremities bilaterally  Skin: Without rashes exudates or nodules  Psych: Normal affect and demeanor  with intact judgement and insight  Data Review   Micro Results No results found for this or any previous visit (from the past 240 hour(s)).  Radiology Reports Ct Abdomen Pelvis W Contrast  03/05/2013   CLINICAL DATA:  Abdominal pain, nausea and vomiting. Blood in stool per patient.  EXAM: CT ABDOMEN AND PELVIS WITH CONTRAST  TECHNIQUE: Multidetector CT imaging of the abdomen and pelvis was performed using the standard protocol following bolus administration of intravenous contrast.  CONTRAST:  149mL OMNIPAQUE IOHEXOL 300 MG/ML  SOLN  COMPARISON:  CT-ABD-PELV dated 12/08/2005 at [facility]  FINDINGS: 2 mm incidental right middle lobe pulmonary parenchymal nodule versus vessel seen on end incidentally noted on image 8. No further followup is needed for this benign appearing finding. The left lung base is clear.  Liver, gallbladder, adrenal glands, right kidney, spleen, and pancreas are normal in appearance. 9 mm left lower renal pole cortical hypodense lesion is reidentified image 41, not completely characterized but statistically most likely assessed. No ascites or lymphadenopathy.  Uterus and ovaries are normal. Normal appendix. The cecum and transverse colon appear normal. The descending colon is collapsed, but demonstrates diffuse mural edema and trace surrounding stranding, but no surrounding fluid collection or air. For example, see image 56. No ascites, free air, or lymphadenopathy. The rectum is normal in appearance. Bladder is normal. No radiopaque renal or ureteral calculus.  No acute osseous abnormality.  IMPRESSION: Long segmental descending colonic wall thickening and edema, with surrounding trace stranding. These findings are most compatible with colitis, which could represent inflammatory bowel disease such as ulcerative colitis, infectious or inflammatory colitis, and much less likely ischemia but possible given the distribution. No evidence for perforation or pericolonic abscess.    Electronically Signed   By: Conchita Paris M.D.   On: 03/05/2013 18:53    CBC  Recent Labs Lab 03/05/13 1521  03/06/13 0445 03/06/13 1715 03/07/13 0446 03/07/13 1430 03/08/13 0510  WBC 24.5*  < > 13.9* 13.2* 12.6* 14.5* 13.0*  HGB 14.2  < > 12.6 12.4 12.9 13.0 13.1  HCT 41.3  < > 37.7 36.6 38.6 37.8 38.6  PLT 480*  < > 322 318 352 326 314  MCV 87.7  < > 89.8 89.1 88.7 87.7 87.5  MCH 30.1  < > 30.0 30.2 29.7 30.2 29.7  MCHC 34.4  < > 33.4 33.9 33.4 34.4 33.9  RDW 12.9  < > 13.1 13.0 12.8 12.8 12.8  LYMPHSABS 2.8  --  2.1  --   --   --   --   MONOABS 1.1*  --  0.8  --   --   --   --   EOSABS 0.0  --  0.2  --   --   --   --   BASOSABS 0.0  --  0.0  --   --   --   --   < > = values in this interval not displayed.  Chemistries   Recent Labs Lab 03/05/13 1521 03/06/13 0445 03/07/13 0446  NA 139 141 141  K 3.6* 4.1 3.4*  CL 98 103 101  CO2 26 27 27   GLUCOSE 96 106* 95  BUN 8 6 5*  CREATININE 0.69 0.72 0.71  CALCIUM 9.3 8.5 8.5  AST 12 12 10   ALT 12 9 10   ALKPHOS 93 79 78  BILITOT 0.3 0.3 0.3   ------------------------------------------------------------------------------------------------------------------ estimated creatinine clearance is 116.2 ml/min (by C-G formula based on Cr of 0.71). ------------------------------------------------------------------------------------------------------------------ No results found for this basename: HGBA1C,  in the last 72 hours ------------------------------------------------------------------------------------------------------------------ No results found for this basename: CHOL, HDL, LDLCALC, TRIG, CHOLHDL, LDLDIRECT,  in the last 72 hours ------------------------------------------------------------------------------------------------------------------ No results found for this basename: TSH, T4TOTAL, FREET3, T3FREE, THYROIDAB,  in the last 72  hours ------------------------------------------------------------------------------------------------------------------ No results found for this basename: VITAMINB12, FOLATE, FERRITIN, TIBC, IRON, RETICCTPCT,  in the last 72 hours  Coagulation profile  Recent Labs Lab 03/07/13 0446  INR 1.07    No results found for this basename: DDIMER,  in the last 72 hours  Cardiac Enzymes No results found for this basename: CK, CKMB, TROPONINI, MYOGLOBIN,  in the last 168 hours ------------------------------------------------------------------------------------------------------------------ No components found with this basename: POCBNP,     Abdulloh Ullom D.O. on 03/08/2013 at 9:07 AM  Between 7am to 7pm - Pager - [phone]  After 7pm go to [email or web address] - password [email or web address]  And look for the night coverage person covering for me after hours  [facility] Office  [phone]

## 2013-03-08 NOTE — ED Provider Notes (Signed)
Medical screening examination/treatment/procedure(s) were performed by non-physician practitioner and as supervising physician I was immediately available for consultation/collaboration.  EKG Interpretation    Date/Time:    Ventricular Rate:    PR Interval:    QRS Duration:   QT Interval:    QTC Calculation:   R Axis:     Text Interpretation:               Threasa Beards, MD 03/08/13 [phone]

## 2013-03-09 ENCOUNTER — Encounter (HOSPITAL_COMMUNITY): Payer: Self-pay | Admitting: General Practice

## 2013-03-09 DIAGNOSIS — I10 Essential (primary) hypertension: Secondary | ICD-10-CM

## 2013-03-09 DIAGNOSIS — R51 Headache: Secondary | ICD-10-CM

## 2013-03-09 LAB — MAGNESIUM: Magnesium: 1.9 mg/dL (ref 1.5–2.5)

## 2013-03-09 LAB — POTASSIUM: Potassium: 3.5 mEq/L — ABNORMAL LOW (ref 3.7–5.3)

## 2013-03-09 LAB — CBC
HCT: 37.4 % (ref 36.0–46.0)
Hemoglobin: 12.7 g/dL (ref 12.0–15.0)
MCH: 30.1 pg (ref 26.0–34.0)
MCHC: 34 g/dL (ref 30.0–36.0)
MCV: 88.6 fL (ref 78.0–100.0)
PLATELETS: 315 10*3/uL (ref 150–400)
RBC: 4.22 MIL/uL (ref 3.87–5.11)
RDW: 12.9 % (ref 11.5–15.5)
WBC: 10.9 10*3/uL — ABNORMAL HIGH (ref 4.0–10.5)

## 2013-03-09 MED ORDER — POTASSIUM CHLORIDE CRYS ER 20 MEQ PO TBCR
40.0000 meq | EXTENDED_RELEASE_TABLET | Freq: Once | ORAL | Status: AC
  Administered 2013-03-09: 40 meq via ORAL
  Filled 2013-03-09: qty 2

## 2013-03-09 NOTE — Progress Notes (Addendum)
[facility]                                                                              Patient Demographics  Cassandra Greer, is a 38 y.o. female, DOB - January 31, 1976, [id number]  Admit date - 03/05/2013   Admitting Physician Rise Patience, MD  Outpatient Primary MD for the patient is No PCP Per Patient  LOS - 4   Chief Complaint  Patient presents with  . Rectal Bleeding        Assessment & Plan   Potential infectious colitis -Continue empiric cipro/flagyl -Leukocytosis trending downward, patient afebrile -Advancing diet today to solids, if tolerated, will likely discharge 1/30 -Continue pain control as well as antiemetics, transitioned to PO pain meds   Uncontrolled Hypertension -On no home medications, maybe secondary to pain -Will add hydralazine IV q6PRN  Hypokalemia -K 3.5, will replete and obtain magnesium level -Possibly secondary to diarrhea  Headache -Likely secondary to uncontrolled HTN -Will continue to monitor  Morbid obesity, Body mass index is 33.45 kg/(m^2). -consider outpatient weight loss reduction strategies   Metabolic syndrome X -consider outpatient workup: Hba1c, Lipid panel   Code Status: Full  Family Communication: None at bedside  Disposition Plan: Admitted, likely discharge 1/30  Time Spent in minutes   25 minutes  Procedures None  Consults  None  DVT Prophylaxis SCDs   Lab Results  Component Value Date   PLT 315 03/09/2013    Medications  Scheduled Meds: . ciprofloxacin  400 mg Intravenous Q12H  . metronidazole  500 mg Intravenous Q8H  . ondansetron  4 mg Oral Q4H   Or  . ondansetron (ZOFRAN) IV  4 mg Intravenous Q4H  . promethazine  12.5 mg Intravenous Once   Continuous Infusions:  PRN Meds:.acetaminophen, acetaminophen, hydrALAZINE, HYDROmorphone, ondansetron  Antibiotics    Anti-infectives   Start     Dose/Rate Route Frequency Ordered Stop   03/05/13 2200  ciprofloxacin (CIPRO) IVPB 400 mg      400 mg 200 mL/hr over 60 Minutes Intravenous Every 12 hours 03/05/13 2116     03/05/13 2100  metroNIDAZOLE (FLAGYL) IVPB 500 mg     500 mg 100 mL/hr over 60 Minutes Intravenous Every 8 hours 03/05/13 2059     03/05/13 1930  ciprofloxacin (CIPRO) IVPB 400 mg     400 mg 200 mL/hr over 60 Minutes Intravenous  Once 03/05/13 1920 03/05/13 2049   03/05/13 1930  metroNIDAZOLE (FLAGYL) IVPB 500 mg  Status:  Discontinued     500 mg 100 mL/hr over 60 Minutes Intravenous  Once 03/05/13 1920 03/05/13 2214        Subjective:   Cassandra Greer seen and examined today.  Patient states she still has sharp pain in her abdomen.  She is going to try to eat a regular diet today.  No further diarrhea.  She had one episode of vomiting overnight.  Objective:   Filed Vitals:   03/08/13 0611 03/08/13 1430 03/08/13 2133 03/09/13 0515  BP: 186/104 145/96 176/103 154/96  Pulse: 72 74 66 58  Temp: 97.7 F (36.5 C) 97.6 F (36.4 C) 98.1 F (36.7 C) 98.4 F (36.9 C)  TempSrc: Oral  Oral Oral   Resp: 16 18 19 18   Height:      Weight:      SpO2: 94% 98% 98% 98%    Wt Readings from Last 3 Encounters:  03/05/13 96.888 kg (213 lb 9.6 oz)  02/24/13 99.791 kg (220 lb)     Intake/Output Summary (Last 24 hours) at 03/09/13 0841 Last data filed at 03/09/13 0425  Gross per 24 hour  Intake   2300 ml  Output      0 ml  Net   2300 ml    Exam  General: Well developed, well nourished, NAD, appears stated age  23: NCAT, mucous membranes moist.  Neck: Supple, no JVD, no masses  Cardiovascular: S1 S2 auscultated, Regular rate and rhythm.  Respiratory: Clear to auscultation bilaterally with equal chest rise  Abdomen: Soft, mild tenderness in lower quadrants, nondistended, + bowel sounds  Extremities: warm dry without cyanosis clubbing or edema  Neuro: AAOx3, cranial nerves grossly intact.   Skin: Without rashes exudates or nodules  Psych: Normal affect and demeanor with intact judgement  and insight  Data Review   Micro Results No results found for this or any previous visit (from the past 240 hour(s)).  Radiology Reports Ct Abdomen Pelvis W Contrast  03/05/2013   CLINICAL DATA:  Abdominal pain, nausea and vomiting. Blood in stool per patient.  EXAM: CT ABDOMEN AND PELVIS WITH CONTRAST  TECHNIQUE: Multidetector CT imaging of the abdomen and pelvis was performed using the standard protocol following bolus administration of intravenous contrast.  CONTRAST:  148mL OMNIPAQUE IOHEXOL 300 MG/ML  SOLN  COMPARISON:  CT-ABD-PELV dated 12/08/2005 at [facility]  FINDINGS: 2 mm incidental right middle lobe pulmonary parenchymal nodule versus vessel seen on end incidentally noted on image 8. No further followup is needed for this benign appearing finding. The left lung base is clear.  Liver, gallbladder, adrenal glands, right kidney, spleen, and pancreas are normal in appearance. 9 mm left lower renal pole cortical hypodense lesion is reidentified image 41, not completely characterized but statistically most likely assessed. No ascites or lymphadenopathy.  Uterus and ovaries are normal. Normal appendix. The cecum and transverse colon appear normal. The descending colon is collapsed, but demonstrates diffuse mural edema and trace surrounding stranding, but no surrounding fluid collection or air. For example, see image 56. No ascites, free air, or lymphadenopathy. The rectum is normal in appearance. Bladder is normal. No radiopaque renal or ureteral calculus.  No acute osseous abnormality.  IMPRESSION: Long segmental descending colonic wall thickening and edema, with surrounding trace stranding. These findings are most compatible with colitis, which could represent inflammatory bowel disease such as ulcerative colitis, infectious or inflammatory colitis, and much less likely ischemia but possible given the distribution. No evidence for perforation or pericolonic abscess.   Electronically  Signed   By: Conchita Paris M.D.   On: 03/05/2013 18:53    CBC  Recent Labs Lab 03/05/13 1521  03/06/13 0445  03/07/13 0446 03/07/13 1430 03/08/13 0510 03/08/13 1450 03/09/13 0244  WBC 24.5*  < > 13.9*  < > 12.6* 14.5* 13.0* 14.1* 10.9*  HGB 14.2  < > 12.6  < > 12.9 13.0 13.1 13.8 12.7  HCT 41.3  < > 37.7  < > 38.6 37.8 38.6 39.9 37.4  PLT 480*  < > 322  < > 352 326 314 346 315  MCV 87.7  < > 89.8  < > 88.7 87.7 87.5 88.5 88.6  MCH 30.1  < >  30.0  < > 29.7 30.2 29.7 30.6 30.1  MCHC 34.4  < > 33.4  < > 33.4 34.4 33.9 34.6 34.0  RDW 12.9  < > 13.1  < > 12.8 12.8 12.8 12.8 12.9  LYMPHSABS 2.8  --  2.1  --   --   --   --   --   --   MONOABS 1.1*  --  0.8  --   --   --   --   --   --   EOSABS 0.0  --  0.2  --   --   --   --   --   --   BASOSABS 0.0  --  0.0  --   --   --   --   --   --   < > = values in this interval not displayed.  Chemistries   Recent Labs Lab 03/05/13 1521 03/06/13 0445 03/07/13 0446 03/08/13 0955  NA 139 141 141  --   K 3.6* 4.1 3.4* 3.2*  CL 98 103 101  --   CO2 26 27 27   --   GLUCOSE 96 106* 95  --   BUN 8 6 5*  --   CREATININE 0.69 0.72 0.71  --   CALCIUM 9.3 8.5 8.5  --   AST 12 12 10   --   ALT 12 9 10   --   ALKPHOS 93 79 78  --   BILITOT 0.3 0.3 0.3  --    ------------------------------------------------------------------------------------------------------------------ estimated creatinine clearance is 116.2 ml/min (by C-G formula based on Cr of 0.71). ------------------------------------------------------------------------------------------------------------------ No results found for this basename: HGBA1C,  in the last 72 hours ------------------------------------------------------------------------------------------------------------------ No results found for this basename: CHOL, HDL, LDLCALC, TRIG, CHOLHDL, LDLDIRECT,  in the last 72  hours ------------------------------------------------------------------------------------------------------------------ No results found for this basename: TSH, T4TOTAL, FREET3, T3FREE, THYROIDAB,  in the last 72 hours ------------------------------------------------------------------------------------------------------------------ No results found for this basename: VITAMINB12, FOLATE, FERRITIN, TIBC, IRON, RETICCTPCT,  in the last 72 hours  Coagulation profile  Recent Labs Lab 03/07/13 0446  INR 1.07    No results found for this basename: DDIMER,  in the last 72 hours  Cardiac Enzymes No results found for this basename: CK, CKMB, TROPONINI, MYOGLOBIN,  in the last 168 hours ------------------------------------------------------------------------------------------------------------------ No components found with this basename: POCBNP,     Enriqueta Augusta D.O. on 03/09/2013 at 8:41 AM  Between 7am to 7pm - Pager - [phone]  After 7pm go to [email or web address] - password [email or web address]  And look for the night coverage person covering for me after hours  [facility] Office  [phone]

## 2013-03-10 LAB — CBC
HCT: 38.2 % (ref 36.0–46.0)
Hemoglobin: 13.3 g/dL (ref 12.0–15.0)
MCH: 30.7 pg (ref 26.0–34.0)
MCHC: 34.8 g/dL (ref 30.0–36.0)
MCV: 88.2 fL (ref 78.0–100.0)
PLATELETS: 340 10*3/uL (ref 150–400)
RBC: 4.33 MIL/uL (ref 3.87–5.11)
RDW: 13.1 % (ref 11.5–15.5)
WBC: 15.3 10*3/uL — ABNORMAL HIGH (ref 4.0–10.5)

## 2013-03-10 LAB — BASIC METABOLIC PANEL
BUN: 11 mg/dL (ref 6–23)
CO2: 24 mEq/L (ref 19–32)
Calcium: 8.6 mg/dL (ref 8.4–10.5)
Chloride: 102 mEq/L (ref 96–112)
Creatinine, Ser: 0.72 mg/dL (ref 0.50–1.10)
GFR calc Af Amer: >90 mL/min (ref >90)
GLUCOSE: 93 mg/dL (ref 70–99)
Potassium: 4.3 mEq/L (ref 3.7–5.3)
Sodium: 139 mEq/L (ref 137–147)

## 2013-03-10 MED ORDER — CIPROFLOXACIN HCL 500 MG PO TABS: 500.0000 mg | ORAL_TABLET | Freq: Two times a day (BID) | ORAL | Status: DC

## 2013-03-10 MED ORDER — LISINOPRIL 5 MG PO TABS: 5.0000 mg | ORAL_TABLET | Freq: Every day | ORAL | Status: DC

## 2013-03-10 MED ORDER — LISINOPRIL 5 MG PO TABS
5.0000 mg | ORAL_TABLET | Freq: Every day | ORAL | Status: DC
  Administered 2013-03-10: 5 mg via ORAL
  Filled 2013-03-10: qty 1

## 2013-03-10 MED ORDER — METRONIDAZOLE 500 MG PO TABS
500.0000 mg | ORAL_TABLET | Freq: Three times a day (TID) | ORAL | Status: DC
Start: 2013-03-10 — End: 2016-12-21

## 2013-03-10 NOTE — Discharge Instructions (Signed)
Colitis °Colitis is inflammation of the colon. Colitis can be a short-term or long-standing (chronic) illness. Crohn's disease and ulcerative colitis are 2 types of colitis which are chronic. They usually require lifelong treatment. °CAUSES  °There are many different causes of colitis, including: °· Viruses. °· Germs (bacteria). °· Medicine reactions. °SYMPTOMS  °· Diarrhea. °· Intestinal bleeding. °· Pain. °· Fever. °· Throwing up (vomiting). °· Tiredness (fatigue). °· Weight loss. °· Bowel blockage. °DIAGNOSIS  °The diagnosis of colitis is based on examination and stool or blood tests. X-rays, CT scan, and colonoscopy may also be needed. °TREATMENT  °Treatment may include: °· Fluids given through the vein (intravenously). °· Bowel rest (nothing to eat or drink for a period of time). °· Medicine for pain and diarrhea. °· Medicines (antibiotics) that kill germs. °· Cortisone medicines. °· Surgery. °HOME CARE INSTRUCTIONS  °· Get plenty of rest. °· Drink enough water and fluids to keep your urine clear or pale yellow. °· Eat a well-balanced diet. °· Call your caregiver for follow-up as recommended. °SEEK IMMEDIATE MEDICAL CARE IF:  °· You develop chills. °· You have an oral temperature above 102° F (38.9° C), not controlled by medicine. °· You have extreme weakness, fainting, or dehydration. °· You have repeated vomiting. °· You develop severe belly (abdominal) pain or are passing bloody or tarry stools. °MAKE SURE YOU:  °· Understand these instructions. °· Will watch your condition. °· Will get help right away if you are not doing well or get worse. °Document Released: 03/05/2004 Document Revised: 04/20/2011 Document Reviewed: 05/31/2009 °[employer]® Patient Information ©2014 [employer]. ° °

## 2013-03-10 NOTE — Discharge Summary (Signed)
Physician Discharge Summary  Cassandra Greer [id number] DOB: 1975/05/07 DOA: 03/05/2013  PCP: No PCP Per Patient  Admit date: 03/05/2013 Discharge date: 03/10/2013  Time spent: 35 minutes  Recommendations for Outpatient Follow-up:  Patient will be discharged home. She will need to find a primary care physician in sup appointment in one week of discharge. Patient to continue doing her medications as prescribed. Patient was instructed to return to emergency department if her symptoms worsened.   Discharge Diagnoses:  Principal Problem:   Colitis Active Problems:   Leucocytosis   Sinusitis   HTN (hypertension)   Hypokalemia   Headache(784.0)   Discharge Condition: Stable  Diet recommendation: Heart Healthy  Filed Weights   03/05/13 1519 03/05/13 2109  Weight: 92.704 kg (204 lb 6 oz) 96.888 kg (213 lb 9.6 oz)    History of present illness:  Cassandra Greer is a 38 y.o. female with no significant past medical history presented the ER because of rectal bleeding. Patient has had multiple bouts of bowel movements which was frankly bloody since last night. Patient also has been having crampy abdominal pain and with nausea vomiting. Patient 3 days ago had taken 2 doses of Augmentin for sinusitis. Patient did not have further doses as patient was not able to afford it. In the ER CAT scan of the abdomen shows features concerning for colitis. Patient also has significant leukocytosis. Patient otherwise is afebrile. Patient has been placed on Cipro and Flagyl and admitted for further management. Patient denies having any recent travels or come in contact with patients with diarrhea. Denies having any hamburgers recently. Has not had any previous episodes of bloody diarrhea. Denies any chest pain or shortness of breath. Patient has been having features of sinusitis and upper respiratory tract infection like symptoms for last 2 weeks. Still has running nose.   Hospital Course:    Potential infectious colitis  -Continue empiric cipro/flagyl, will be discharged with the remainder of 14 day course.  -Leukocytosis trending downward, patient afebrile  -Advancing diet today to solids, if tolerated, will likely discharge 1/30  -Improving.  Uncontrolled Hypertension  -On no home medications, maybe secondary to pain  -Will be discharged with Lisinopril   Hypokalemia  -Resolved, K 4.3, Magnesium 1.9 -Possibly secondary to diarrhea   Headache  -Resolved, Likely secondary to uncontrolled HTN   Morbid obesity, Body mass index is 33.45 kg/(m^2).  -consider outpatient weight loss reduction strategies   Metabolic syndrome X  -consider outpatient workup: Hba1c, Lipid panel   Procedures:  None  Consultations:  None  Discharge Exam: Filed Vitals:   03/10/13 0951  BP: 142/92  Pulse: 82  Temp: 98.3 F (36.8 C)  Resp: 18   Exam  General: Well developed, well nourished, NAD, appears stated age  HEENT: NCAT, mucous membranes moist.  Neck: Supple, no JVD, no masses  Cardiovascular: S1 S2 auscultated, Regular rate and rhythm.  Respiratory: Clear to auscultation bilaterally with equal chest rise  Abdomen: Soft, mild tenderness in lower quadrants, nondistended, + bowel sounds  Extremities: warm dry without cyanosis clubbing or edema  Neuro: AAOx3, cranial nerves grossly intact.  Skin: Without rashes exudates or nodules  Psych: Normal affect and demeanor with intact judgement and insight   Discharge Instructions      Discharge Orders   Future Orders Complete By Expires   Diet - low sodium heart healthy  As directed    Discharge instructions  As directed    Comments:     Patient will be  discharged home. She will need to find a primary care physician in sup appointment in one week of discharge. Patient to continue doing her medications as prescribed. Patient was instructed to return to emergency department if her symptoms worsened.   Increase activity  slowly  As directed        Medication List    STOP taking these medications       amoxicillin-clavulanate 875-125 MG per tablet  Commonly known as:  AUGMENTIN     ciprofloxacin-hydrocortisone otic suspension  Commonly known as:  CIPRO HC     ibuprofen 800 MG tablet  Commonly known as:  ADVIL,MOTRIN      TAKE these medications       ciprofloxacin 500 MG tablet  Commonly known as:  CIPRO  Take 1 tablet (500 mg total) by mouth 2 (two) times daily.     lisinopril 5 MG tablet  Commonly known as:  PRINIVIL,ZESTRIL  Take 1 tablet (5 mg total) by mouth daily.     metroNIDAZOLE 500 MG tablet  Commonly known as:  FLAGYL  Take 1 tablet (500 mg total) by mouth 3 (three) times daily.       No Known Allergies Follow-up Information   Follow up with Primary Care Physician. Schedule an appointment as soon as possible for a visit in 1 week.       The results of significant diagnostics from this hospitalization (including imaging, microbiology, ancillary and laboratory) are listed below for reference.    Significant Diagnostic Studies: Ct Abdomen Pelvis W Contrast  03/05/2013   CLINICAL DATA:  Abdominal pain, nausea and vomiting. Blood in stool per patient.  EXAM: CT ABDOMEN AND PELVIS WITH CONTRAST  TECHNIQUE: Multidetector CT imaging of the abdomen and pelvis was performed using the standard protocol following bolus administration of intravenous contrast.  CONTRAST:  168mL OMNIPAQUE IOHEXOL 300 MG/ML  SOLN  COMPARISON:  CT-ABD-PELV dated 12/08/2005 at [facility]  FINDINGS: 2 mm incidental right middle lobe pulmonary parenchymal nodule versus vessel seen on end incidentally noted on image 8. No further followup is needed for this benign appearing finding. The left lung base is clear.  Liver, gallbladder, adrenal glands, right kidney, spleen, and pancreas are normal in appearance. 9 mm left lower renal pole cortical hypodense lesion is reidentified image 41, not completely  characterized but statistically most likely assessed. No ascites or lymphadenopathy.  Uterus and ovaries are normal. Normal appendix. The cecum and transverse colon appear normal. The descending colon is collapsed, but demonstrates diffuse mural edema and trace surrounding stranding, but no surrounding fluid collection or air. For example, see image 56. No ascites, free air, or lymphadenopathy. The rectum is normal in appearance. Bladder is normal. No radiopaque renal or ureteral calculus.  No acute osseous abnormality.  IMPRESSION: Long segmental descending colonic wall thickening and edema, with surrounding trace stranding. These findings are most compatible with colitis, which could represent inflammatory bowel disease such as ulcerative colitis, infectious or inflammatory colitis, and much less likely ischemia but possible given the distribution. No evidence for perforation or pericolonic abscess.   Electronically Signed   By: Conchita Paris M.D.   On: 03/05/2013 18:53    Microbiology: No results found for this or any previous visit (from the past 240 hour(s)).   Labs: Basic Metabolic Panel:  Recent Labs Lab 03/05/13 1521 03/06/13 0445 03/07/13 0446 03/08/13 0955 03/09/13 0950 03/09/13 1428 03/10/13 0445  NA 139 141 141  --   --   --  139  K 3.6* 4.1 3.4* 3.2* 3.5*  --  4.3  CL 98 103 101  --   --   --  102  CO2 26 27 27   --   --   --  24  GLUCOSE 96 106* 95  --   --   --  93  BUN 8 6 5*  --   --   --  11  CREATININE 0.69 0.72 0.71  --   --   --  0.72  CALCIUM 9.3 8.5 8.5  --   --   --  8.6  MG  --   --   --   --   --  1.9  --    Liver Function Tests:  Recent Labs Lab 03/05/13 1521 03/06/13 0445 03/07/13 0446  AST 12 12 10   ALT 12 9 10   ALKPHOS 93 79 78  BILITOT 0.3 0.3 0.3  PROT 7.8 6.6 6.7  ALBUMIN 4.0 3.3* 3.4*   No results found for this basename: LIPASE, AMYLASE,  in the last 168 hours No results found for this basename: AMMONIA,  in the last 168  hours CBC:  Recent Labs Lab 03/05/13 1521  03/06/13 0445  03/07/13 1430 03/08/13 0510 03/08/13 1450 03/09/13 0244 03/10/13 0445  WBC 24.5*  < > 13.9*  < > 14.5* 13.0* 14.1* 10.9* 15.3*  NEUTROABS 20.5*  --  10.9*  --   --   --   --   --   --   HGB 14.2  < > 12.6  < > 13.0 13.1 13.8 12.7 13.3  HCT 41.3  < > 37.7  < > 37.8 38.6 39.9 37.4 38.2  MCV 87.7  < > 89.8  < > 87.7 87.5 88.5 88.6 88.2  PLT 480*  < > 322  < > 326 314 346 315 340  < > = values in this interval not displayed. Cardiac Enzymes: No results found for this basename: CKTOTAL, CKMB, CKMBINDEX, TROPONINI,  in the last 168 hours BNP: BNP (last 3 results) No results found for this basename: PROBNP,  in the last 8760 hours CBG: No results found for this basename: GLUCAP,  in the last 168 hours     Signed:  Cristal Ford  [facility] 03/10/2013, 9:54 AM

## 2013-03-10 NOTE — Discharge Planning (Addendum)
At 1300 copy of home instruction to pt who verbalizes understanding.  Given 3 rx also.  Will dc per wc to private car home when her ride arrives. D'cd at 1430.

## 2013-03-13 LAB — STOOL CULTURE

## 2014-01-09 ENCOUNTER — Emergency Department (HOSPITAL_COMMUNITY): Payer: Self-pay

## 2014-01-09 ENCOUNTER — Emergency Department (HOSPITAL_COMMUNITY)
Admission: EM | Admit: 2014-01-09 | Discharge: 2014-01-09 | Disposition: A | Discharge status: Home / Self Care | Payer: Self-pay | Attending: Emergency Medicine | Admitting: Emergency Medicine

## 2014-01-09 ENCOUNTER — Encounter (HOSPITAL_COMMUNITY): Payer: Self-pay | Admitting: *Deleted

## 2014-01-09 DIAGNOSIS — R0789 Other chest pain: Secondary | ICD-10-CM | POA: Insufficient documentation

## 2014-01-09 DIAGNOSIS — M791 Myalgia: Secondary | ICD-10-CM | POA: Insufficient documentation

## 2014-01-09 DIAGNOSIS — I10 Essential (primary) hypertension: Secondary | ICD-10-CM | POA: Insufficient documentation

## 2014-01-09 DIAGNOSIS — R112 Nausea with vomiting, unspecified: Secondary | ICD-10-CM | POA: Insufficient documentation

## 2014-01-09 DIAGNOSIS — Z3202 Encounter for pregnancy test, result negative: Secondary | ICD-10-CM | POA: Insufficient documentation

## 2014-01-09 DIAGNOSIS — Z8719 Personal history of other diseases of the digestive system: Secondary | ICD-10-CM | POA: Insufficient documentation

## 2014-01-09 DIAGNOSIS — Z79899 Other long term (current) drug therapy: Secondary | ICD-10-CM | POA: Insufficient documentation

## 2014-01-09 DIAGNOSIS — R51 Headache: Secondary | ICD-10-CM | POA: Insufficient documentation

## 2014-01-09 DIAGNOSIS — H538 Other visual disturbances: Secondary | ICD-10-CM | POA: Insufficient documentation

## 2014-01-09 DIAGNOSIS — H9209 Otalgia, unspecified ear: Secondary | ICD-10-CM | POA: Insufficient documentation

## 2014-01-09 DIAGNOSIS — J069 Acute upper respiratory infection, unspecified: Secondary | ICD-10-CM | POA: Insufficient documentation

## 2014-01-09 DIAGNOSIS — R197 Diarrhea, unspecified: Secondary | ICD-10-CM | POA: Insufficient documentation

## 2014-01-09 DIAGNOSIS — R109 Unspecified abdominal pain: Secondary | ICD-10-CM | POA: Insufficient documentation

## 2014-01-09 DIAGNOSIS — R079 Chest pain, unspecified: Secondary | ICD-10-CM

## 2014-01-09 DIAGNOSIS — R195 Other fecal abnormalities: Secondary | ICD-10-CM | POA: Insufficient documentation

## 2014-01-09 DIAGNOSIS — R091 Pleurisy: Secondary | ICD-10-CM | POA: Insufficient documentation

## 2014-01-09 HISTORY — DX: Essential (primary) hypertension: I10

## 2014-01-09 LAB — I-STAT TROPONIN, ED: TROPONIN I, POC: 0 ng/mL (ref 0.00–0.08)

## 2014-01-09 LAB — URINALYSIS, ROUTINE W REFLEX MICROSCOPIC
Bilirubin Urine: NEGATIVE
Glucose, UA: NEGATIVE mg/dL
KETONES UR: 40 mg/dL — AB
LEUKOCYTES UA: NEGATIVE
Nitrite: NEGATIVE
PROTEIN: 30 mg/dL — AB
Specific Gravity, Urine: 1.023 (ref 1.005–1.030)
UROBILINOGEN UA: 1 mg/dL (ref 0.0–1.0)
pH: 6.5 (ref 5.0–8.0)

## 2014-01-09 LAB — URINE MICROSCOPIC-ADD ON

## 2014-01-09 LAB — BASIC METABOLIC PANEL
ANION GAP: 14 (ref 5–15)
BUN: 11 mg/dL (ref 6–23)
CHLORIDE: 102 meq/L (ref 96–112)
CO2: 25 mEq/L (ref 19–32)
Calcium: 9.4 mg/dL (ref 8.4–10.5)
Creatinine, Ser: 0.72 mg/dL (ref 0.50–1.10)
GFR calc Af Amer: >90 mL/min (ref >90)
GFR calc non Af Amer: >90 mL/min (ref >90)
GLUCOSE: 83 mg/dL (ref 70–99)
POTASSIUM: 4.1 meq/L (ref 3.7–5.3)
Sodium: 141 mEq/L (ref 137–147)

## 2014-01-09 LAB — CBC
HCT: 41.7 % (ref 36.0–46.0)
HEMOGLOBIN: 14.1 g/dL (ref 12.0–15.0)
MCH: 30.4 pg (ref 26.0–34.0)
MCHC: 33.8 g/dL (ref 30.0–36.0)
MCV: 89.9 fL (ref 78.0–100.0)
Platelets: 408 10*3/uL — ABNORMAL HIGH (ref 150–400)
RBC: 4.64 MIL/uL (ref 3.87–5.11)
RDW: 13.5 % (ref 11.5–15.5)
WBC: 16.7 10*3/uL — AB (ref 4.0–10.5)

## 2014-01-09 LAB — PREGNANCY, URINE: Preg Test, Ur: NEGATIVE

## 2014-01-09 MED ORDER — ONDANSETRON 4 MG PO TBDP
8.0000 mg | ORAL_TABLET | Freq: Once | ORAL | Status: AC
  Administered 2014-01-09: 8 mg via ORAL
  Filled 2014-01-09: qty 2

## 2014-01-09 MED ORDER — HYDROCODONE-ACETAMINOPHEN 5-325 MG PO TABS: 1.0000 | ORAL_TABLET | ORAL | Status: DC | PRN

## 2014-01-09 MED ORDER — IOHEXOL 300 MG/ML  SOLN
25.0000 mL | INTRAMUSCULAR | Status: AC
  Administered 2014-01-09: 25 mL via ORAL

## 2014-01-09 MED ORDER — DM-GUAIFENESIN ER 30-600 MG PO TB12
1.0000 | ORAL_TABLET | Freq: Once | ORAL | Status: AC
  Administered 2014-01-09: 1 via ORAL
  Filled 2014-01-09: qty 1

## 2014-01-09 MED ORDER — PROMETHAZINE HCL 25 MG/ML IJ SOLN
12.5000 mg | Freq: Once | INTRAMUSCULAR | Status: AC
  Administered 2014-01-09: 12.5 mg via INTRAVENOUS
  Filled 2014-01-09: qty 1

## 2014-01-09 MED ORDER — MORPHINE SULFATE 4 MG/ML IJ SOLN
4.0000 mg | Freq: Once | INTRAMUSCULAR | Status: AC
  Administered 2014-01-09: 4 mg via INTRAVENOUS
  Filled 2014-01-09: qty 1

## 2014-01-09 MED ORDER — ACETAMINOPHEN 325 MG PO TABS
650.0000 mg | ORAL_TABLET | Freq: Once | ORAL | Status: AC
  Administered 2014-01-09: 650 mg via ORAL
  Filled 2014-01-09: qty 2

## 2014-01-09 MED ORDER — ONDANSETRON HCL 4 MG PO TABS: 8.0000 mg | ORAL_TABLET | Freq: Once | ORAL | Status: DC

## 2014-01-09 MED ORDER — SODIUM CHLORIDE 0.9 % IV BOLUS (SEPSIS)
1000.0000 mL | Freq: Once | INTRAVENOUS | Status: AC
  Administered 2014-01-09: 1000 mL via INTRAVENOUS

## 2014-01-09 MED ORDER — DM-GUAIFENESIN ER 30-600 MG PO TB12: 1.0000 | ORAL_TABLET | Freq: Two times a day (BID) | ORAL | Status: DC

## 2014-01-09 MED ORDER — IOHEXOL 300 MG/ML  SOLN
100.0000 mL | Freq: Once | INTRAMUSCULAR | Status: AC | PRN
  Administered 2014-01-09: 100 mL via INTRAVENOUS

## 2014-01-09 MED ORDER — PROMETHAZINE HCL 25 MG PO TABS: 25.0000 mg | ORAL_TABLET | Freq: Four times a day (QID) | ORAL | Status: DC | PRN

## 2014-01-09 NOTE — ED Notes (Signed)
Non productive cough; raspy voice; taking cough suppressant; having lower abd. Pain, and pleuritic pain when coughing. Mid sternal chest wall pain.

## 2014-01-09 NOTE — ED Provider Notes (Signed)
Medical screening examination/treatment/procedure(s) were conducted as a shared visit with non-physician practitioner(s) and myself.  I personally evaluated the patient during the encounter.   EKG Interpretation None      Patient seen by me. Patient's symptoms seem to be somewhat consistent with a viral upper respiratory infection. But associated with abdominal pain. Patient had a history of colitis back in January and patient was concerned she had that again. Patient has noticed some blood in her bowel movements. Workup will include CT abdomen and pelvis and chest x-ray. Chest x-rays to rule out pneumonia. CT abdomen is to evaluate the possibility of recurrent colitis or other findings. Patient's abdomen is soft nontender to palpation. Lungs are clear bilaterally. Patient does have a primary care doctor. It is [facility]. Blood pressure elevated here she was counseled to make sure she follows up with her blood pressure to make sure this is not a trend if it is a transient may require treatment for hypertension. Dispositional be based on the chest x-ray and CT findings. Regular labs here show a leukocytosis but no other significant abnormalities. No significant anemia. Patient has had some blood in her bowel movements. But appears not to be large amounts.  Fredia Sorrow, MD 01/09/14 [phone]

## 2014-01-09 NOTE — Discharge Instructions (Signed)
Abdominal Pain Many things can cause abdominal pain. Usually, abdominal pain is not caused by a disease and will improve without treatment. It can often be observed and treated at home. Your health care provider will do a physical exam and possibly order blood tests and X-rays to help determine the seriousness of your pain. However, in many cases, more time must pass before a clear cause of the pain can be found. Before that point, your health care provider may not know if you need more testing or further treatment. HOME CARE INSTRUCTIONS  Monitor your abdominal pain for any changes. The following actions may help to alleviate any discomfort you are experiencing:  Only take over-the-counter or prescription medicines as directed by your health care provider.  Do not take laxatives unless directed to do so by your health care provider.  Try a clear liquid diet (broth, tea, or water) as directed by your health care provider. Slowly move to a bland diet as tolerated. SEEK MEDICAL CARE IF:  You have unexplained abdominal pain.  You have abdominal pain associated with nausea or diarrhea.  You have pain when you urinate or have a bowel movement.  You experience abdominal pain that wakes you in the night.  You have abdominal pain that is worsened or improved by eating food.  You have abdominal pain that is worsened with eating fatty foods.  You have a fever. SEEK IMMEDIATE MEDICAL CARE IF:   Your pain does not go away within 2 hours.  You keep throwing up (vomiting).  Your pain is felt only in portions of the abdomen, such as the right side or the left lower portion of the abdomen.  You pass bloody or black tarry stools. MAKE SURE YOU:  Understand these instructions.   Will watch your condition.   Will get help right away if you are not doing well or get worse.  Document Released: 11/05/2004 Document Revised: 01/31/2013 Document Reviewed: 10/05/2012 [facility] Patient Information  2015 [place], [place]. This information is not intended to replace advice given to you by your health care provider. Make sure you discuss any questions you have with your health care provider. Nausea and Vomiting Nausea is a sick feeling that often comes before throwing up (vomiting). Vomiting is a reflex where stomach contents come out of your mouth. Vomiting can cause severe loss of body fluids (dehydration). Children and elderly adults can become dehydrated quickly, especially if they also have diarrhea. Nausea and vomiting are symptoms of a condition or disease. It is important to find the cause of your symptoms. CAUSES   Direct irritation of the stomach lining. This irritation can result from increased acid production (gastroesophageal reflux disease), infection, food poisoning, taking certain medicines (such as nonsteroidal anti-inflammatory drugs), alcohol use, or tobacco use.  Signals from the brain.These signals could be caused by a headache, heat exposure, an inner ear disturbance, increased pressure in the brain from injury, infection, a tumor, or a concussion, pain, emotional stimulus, or metabolic problems.  An obstruction in the gastrointestinal tract (bowel obstruction).  Illnesses such as diabetes, hepatitis, gallbladder problems, appendicitis, kidney problems, cancer, sepsis, atypical symptoms of a heart attack, or eating disorders.  Medical treatments such as chemotherapy and radiation.  Receiving medicine that makes you sleep (general anesthetic) during surgery. DIAGNOSIS Your caregiver may ask for tests to be done if the problems do not improve after a few days. Tests may also be done if symptoms are severe or if the reason for the nausea and  vomiting is not clear. Tests may include:  Urine tests.  Blood tests.  Stool tests.  Cultures (to look for evidence of infection).  X-rays or other imaging studies. Test results can help your caregiver make decisions about  treatment or the need for additional tests. TREATMENT You need to stay well hydrated. Drink frequently but in small amounts.You may wish to drink water, sports drinks, clear broth, or eat frozen ice pops or gelatin dessert to help stay hydrated.When you eat, eating slowly may help prevent nausea.There are also some antinausea medicines that may help prevent nausea. HOME CARE INSTRUCTIONS   Take all medicine as directed by your caregiver.  If you do not have an appetite, do not force yourself to eat. However, you must continue to drink fluids.  If you have an appetite, eat a normal diet unless your caregiver tells you differently.  Eat a variety of complex carbohydrates (rice, wheat, potatoes, bread), lean meats, yogurt, fruits, and vegetables.  Avoid high-fat foods because they are more difficult to digest.  Drink enough water and fluids to keep your urine clear or pale yellow.  If you are dehydrated, ask your caregiver for specific rehydration instructions. Signs of dehydration may include:  Severe thirst.  Dry lips and mouth.  Dizziness.  Dark urine.  Decreasing urine frequency and amount.  Confusion.  Rapid breathing or pulse. SEEK IMMEDIATE MEDICAL CARE IF:   You have blood or brown flecks (like coffee grounds) in your vomit.  You have black or bloody stools.  You have a severe headache or stiff neck.  You are confused.  You have severe abdominal pain.  You have chest pain or trouble breathing.  You do not urinate at least once every 8 hours.  You develop cold or clammy skin.  You continue to vomit for longer than 24 to 48 hours.  You have a fever. MAKE SURE YOU:   Understand these instructions.  Will watch your condition.  Will get help right away if you are not doing well or get worse. Document Released: 01/26/2005 Document Revised: 04/20/2011 Document Reviewed: 06/25/2010 [facility] Patient Information 2015 [place], [place]. This information is not  intended to replace advice given to you by your health care provider. Make sure you discuss any questions you have with your health care provider. Upper Respiratory Infection, Adult An upper respiratory infection (URI) is also sometimes known as the common cold. The upper respiratory tract includes the nose, sinuses, throat, trachea, and bronchi. Bronchi are the airways leading to the lungs. Most people improve within 1 week, but symptoms can last up to 2 weeks. A residual cough may last even longer.  CAUSES Many different viruses can infect the tissues lining the upper respiratory tract. The tissues become irritated and inflamed and often become very moist. Mucus production is also common. A cold is contagious. You can easily spread the virus to others by oral contact. This includes kissing, sharing a glass, coughing, or sneezing. Touching your mouth or nose and then touching a surface, which is then touched by another person, can also spread the virus. SYMPTOMS  Symptoms typically develop 1 to 3 days after you come in contact with a cold virus. Symptoms vary from person to person. They may include:  Runny nose.  Sneezing.  Nasal congestion.  Sinus irritation.  Sore throat.  Loss of voice (laryngitis).  Cough.  Fatigue.  Muscle aches.  Loss of appetite.  Headache.  Low-grade fever. DIAGNOSIS  You might diagnose your own cold based  on familiar symptoms, since most people get a cold 2 to 3 times a year. Your caregiver can confirm this based on your exam. Most importantly, your caregiver can check that your symptoms are not due to another disease such as strep throat, sinusitis, pneumonia, asthma, or epiglottitis. Blood tests, throat tests, and X-rays are not necessary to diagnose a common cold, but they may sometimes be helpful in excluding other more serious diseases. Your caregiver will decide if any further tests are required. RISKS AND COMPLICATIONS  You may be at risk for a more  severe case of the common cold if you smoke cigarettes, have chronic heart disease (such as heart failure) or lung disease (such as asthma), or if you have a weakened immune system. The very young and very old are also at risk for more serious infections. Bacterial sinusitis, middle ear infections, and bacterial pneumonia can complicate the common cold. The common cold can worsen asthma and chronic obstructive pulmonary disease (COPD). Sometimes, these complications can require emergency medical care and may be life-threatening. PREVENTION  The best way to protect against getting a cold is to practice good hygiene. Avoid oral or hand contact with people with cold symptoms. Wash your hands often if contact occurs. There is no clear evidence that vitamin C, vitamin E, echinacea, or exercise reduces the chance of developing a cold. However, it is always recommended to get plenty of rest and practice good nutrition. TREATMENT  Treatment is directed at relieving symptoms. There is no cure. Antibiotics are not effective, because the infection is caused by a virus, not by bacteria. Treatment may include:  Increased fluid intake. Sports drinks offer valuable electrolytes, sugars, and fluids.  Breathing heated mist or steam (vaporizer or shower).  Eating chicken soup or other clear broths, and maintaining good nutrition.  Getting plenty of rest.  Using gargles or lozenges for comfort.  Controlling fevers with ibuprofen or acetaminophen as directed by your caregiver.  Increasing usage of your inhaler if you have asthma. Zinc gel and zinc lozenges, taken in the first 24 hours of the common cold, can shorten the duration and lessen the severity of symptoms. Pain medicines may help with fever, muscle aches, and throat pain. A variety of non-prescription medicines are available to treat congestion and runny nose. Your caregiver can make recommendations and may suggest nasal or lung inhalers for other symptoms.    HOME CARE INSTRUCTIONS   Only take over-the-counter or prescription medicines for pain, discomfort, or fever as directed by your caregiver.  Use a warm mist humidifier or inhale steam from a shower to increase air moisture. This may keep secretions moist and make it easier to breathe.  Drink enough water and fluids to keep your urine clear or pale yellow.  Rest as needed.  Return to work when your temperature has returned to normal or as your caregiver advises. You may need to stay home longer to avoid infecting others. You can also use a face mask and careful hand washing to prevent spread of the virus. SEEK MEDICAL CARE IF:   After the first few days, you feel you are getting worse rather than better.  You need your caregiver's advice about medicines to control symptoms.  You develop chills, worsening shortness of breath, or brown or red sputum. These may be signs of pneumonia.  You develop yellow or brown nasal discharge or pain in the face, especially when you bend forward. These may be signs of sinusitis.  You develop a fever,  swollen neck glands, pain with swallowing, or white areas in the back of your throat. These may be signs of strep throat. SEEK IMMEDIATE MEDICAL CARE IF:   You have a fever.  You develop severe or persistent headache, ear pain, sinus pain, or chest pain.  You develop wheezing, a prolonged cough, cough up blood, or have a change in your usual mucus (if you have chronic lung disease).  You develop sore muscles or a stiff neck. Document Released: 07/22/2000 Document Revised: 04/20/2011 Document Reviewed: 05/03/2013 [facility] Patient Information 2015 [place], [place]. This information is not intended to replace advice given to you by your health care provider. Make sure you discuss any questions you have with your health care provider.   Emergency Department Resource Guide 1) Find a Doctor and Pay Out of Pocket Although you won't have to find out who is  covered by your insurance plan, it is a good idea to ask around and get recommendations. You will then need to call the office and see if the doctor you have chosen will accept you as a new patient and what types of options they offer for patients who are self-pay. Some doctors offer discounts or will set up payment plans for their patients who do not have insurance, but you will need to ask so you aren't surprised when you get to your appointment.  2) Contact Your Local Health Department Not all health departments have doctors that can see patients for sick visits, but many do, so it is worth a call to see if yours does. If you don't know where your local health department is, you can check in your phone book. The CDC also has a tool to help you locate your state's health department, and many state websites also have listings of all of their local health departments.  3) Find a [facility] If your illness is not likely to be very severe or complicated, you may want to try a walk in clinic. These are popping up all over the country in pharmacies, drugstores, and shopping centers. They're usually staffed by nurse practitioners or physician assistants that have been trained to treat common illnesses and complaints. They're usually fairly quick and inexpensive. However, if you have serious medical issues or chronic medical problems, these are probably not your best option.  No Primary Care Doctor: - Call Health Connect at  [phone] - they can help you locate a primary care doctor that  accepts your insurance, provides certain services, etc. - Physician Referral Service- [phone]  Chronic Pain Problems: Organization         Address  Phone   Notes  [facility]  [phone] Patients need to be referred by their primary care doctor.   Medication Assistance: Organization         Address  Phone   Notes  [facility] Medication [facility] [address] [place], [place] [address] [phone] --Must be a resident of [facility] -- Must have NO insurance coverage whatsoever (no Medicaid/ Medicare, etc.) -- The pt. MUST have a primary care doctor that directs their care regularly and follows them in the community   Med[facility]ist  [phone]   [employer]  [phone]    Agencies that provide inexpensive medical care: Organization         Address  Phone   Notes  [place]  [phone]   [facility]    (  [phone]   [facility] [place], [place] [address] [phone]   [place] [address] [place] [phone]   Planned Parenthood    [phone]   [facility]    [phone]   [address] and [place], [place] Phone:  [phone], Fax:  [phone] Hours of Operation:  9 am - 6 pm, M-F.  Also accepts Medicaid/Medicare and self-pay.  [facility] for [place], [address], [place] Phone: [phone], Fax: [phone]. Hours of Operation:  8:30 am - 5:30 pm, M-F.  Also accepts Medicaid and self-pay.  [facility] [place] [address], [place] Phone: [phone]   [place], [place], [place] [phone], Ext. [phone] Mondays & Thursdays: 7-9 AM.  First 15 patients are seen on a first come, first serve basis.    [place] Providers:  Organization         Address  Phone   Notes  [facility] [address], [place] [phone] Also accepts self-pay patients.  [facility] [address], [place]  [phone]   [place], [address], [place] [phone]   [facility] [address] [place] [phone]   Lucianne Lei [address], [place]   [phone] Only accepts [place] Access [place] patients after they have their name applied to their card.   Self-Pay (no insurance) in [facility]:  Organization         Address  Phone   Notes  Sickle Cell Patients, [facility] [place] [phone]   [facility] [place] [phone]   [facility] [place]  [place], [place], [place] [phone]   [facility]/Dr. Osei-Bonsu  [address] [place] or [address], [place] [phone] Phone number for both [place] and [place] locations is the same.  Urgent Medical and [facility] [address] [place] [phone]   [facility] [address] [place] or [address] [phone]   [facility] [address] [place] [phone], phone; [phone], fax Sees patients 1st and 3rd Saturday of every month.  Must not qualify for public or private insurance (i.e. Medicaid, Medicare, [place] Health Choice, Veterans' Benefits)  Household income should be no more than 200% of the poverty level The clinic cannot treat you if you are pregnant or think you are pregnant  Sexually transmitted diseases are not treated at the clinic.    Dental Care: Organization         Address  Phone  Notes  [facility] Department of [facility] [place] [phone] Accepts children up to age 66 who are enrolled in [place] or [place]; pregnant women with a Medicaid card; and children who have applied for Medicaid or [place] Health Choice, but were declined, whose parents can pay a reduced fee at time of service.  [facility] Department of [facility]  [address], [place] [phone] Accepts children up to  age 34 who are enrolled  in Medicaid or [place] Health Choice; pregnant women with a Medicaid card; and children who have applied for Medicaid or [place] Health Choice, but were declined, whose parents can pay a reduced fee at time of service.  [facility] Adult [facility] Access PROGRAM  [place] [phone] Patients are seen by appointment only. Walk-ins are not accepted. [place] will see patients 44 years of age and older. Monday - Tuesday (8am-5pm) Most Wednesdays (8:30-5pm) $30 per visit, cash only  [facility] Adult Dental Access PROGRAM  [address], [facility] [phone] Patients are seen by appointment only. Walk-ins are not accepted. [place] will see patients 35 years of age and older. One Wednesday Evening (Monthly: Volunteer Based).  $30 per visit, cash only  [place]  [phone] for adults; Children under age 40, call Graduate Pediatric Dentistry at [phone]. Children aged 84-14, please call [phone] to request a pediatric application.  Dental services are provided in all areas of dental care including fillings, crowns and bridges, complete and partial dentures, implants, gum treatment, root canals, and extractions. Preventive care is also provided. Treatment is provided to both adults and children. Patients are selected via a lottery and there is often a waiting list.   [facility] [address], [place]  [phone] [email or web address]   [facility] [address] [place], [place] [phone], Ext. [phone] Second and Fourth Thursday of each month, opens at 6:30 AM; Clinic ends at 9 AM.  Patients are seen on a first-come first-served basis, and a limited number are seen during each clinic.   [facility]  [address] Hillard Danker [place], [place] [phone]   Eligibility Requirements You must have lived in [place], [place], or [place] counties for at least the last three months.   You cannot be  eligible for state or federal sponsored [employer], including [employer], [place], or [employer].   You generally cannot be eligible for healthcare insurance through your employer.    How to apply: Eligibility screenings are held every Tuesday and Wednesday afternoon from 1:00 pm until 4:00 pm. You do not need an appointment for the interview!  [facility] [address], [place], [place]   [place]  [place] Department  [place]  [phone]    Behavioral Health Resources in the Community: Intensive Outpatient Programs Organization         Address  Phone  Notes  [place]. [address], [place], [place] [phone]   [facility] [address] [place], [place]   ADS: Alcohol & Drug S[facility] [address], [place], [place]   [place] [address],  [place], [place] or [phone]   Substance Abuse Resources Organization         Address  Phone  Notes  Alcohol and Drug Services  [phone]   [place]  [phone]   [facility] [place]   Chinita Pester  [phone]   Residential & Outpatient Substance Abuse Program  [phone]   Psychological Services Organization         Address  Phone  Notes  [place]  [place]  [place]   [place] [phone]  [address] [place] or [phone]    Mobile Crisis Teams Organization         Address  Phone  Notes  [facility], Mobile Crisis Care Unit  [phone]   [facility]  [address] [place], [place]   [place] [address] [place] [phone]    Self-Help/Support Groups Organization          Address  Phone             Notes  [place]. of [place] - variety of support groups  [place] Call for more information  Narcotics Anonymous (NA), [facility] [address], [employer] [place]  2 meetings at this location   Special educational needs teacher         Address  Phone  Notes  [facility] Residential Treatment [place],    [place]  [phone]   [facility]  [address], [place] [address], [place], [place]   [place], [place] [phone] Admissions: 8am-3pm M-F  Incentives Substance [place] [address]    [place], [place] [phone]   [facility] [address] [place], [place], [place]   [facility] [address],  [place], [place]   [facility] - Intensive Outpatient [address] Kristeen Mans [address], [place], [place]   [facility] [address].,  [place], [place] [phone] or [phone]   [facility] Services (RTS) [address], [place], [place] Accepts Medicaid  [facility] [place] [address]  [place] [phone] Substance Abuse/Addiction Treatment   [facility] Organization         Address  Phone  Notes  [facility]  [phone]   Domenic Schwab, PhD [address] Arlis Porta [place], [place]   [phone] or [phone]   [place], [place] [phone]   [facility] [address] [place], [place] [phone] Insurance/Medicaid/sponsorship through [facility] and Families [address], [address] [place]                                    [place], [place] [phone] [place] [address], [place] [phone]    Dr. Adele Schilder  [phone]   [facility] Dept. 1) [address], [place] 2) [place] 3)  [place] [address], [place] [phone]  [phone]   [place] [phone] or [phone] (After Hours)

## 2014-01-09 NOTE — ED Provider Notes (Signed)
CSN: [id number]     Arrival date & time 01/09/14  1445 History   First MD Initiated Contact with Patient 01/09/14 1607     Chief Complaint  Patient presents with  . Cough  . Abdominal Pain  . Pleurisy   Cassandra Greer is a 38 y.o. female with a history of colitis who presents to the ED complaining of low abdominal pain, body aches, subjective fever, vomiting, diarrhea, midsternal chest pain, fevers and cough. She reports that her symptoms began 3 days ago with low abdominal pain, vomiting, diarrhea, nasal congestion, and sore throat. She reports that last night she developed a cough which caused some post-tussive emesis and mid-sternal chest pain. Her chest pain is non-radiating. The patient also reports a generalized headache. The patient's worst pain is in her abdomen and she rates this at a 9/10 and describes as an ache. The patient's pain is generalized to her lower abdomen and does not radiate. The patient reports she has some bright red blood streaked in her stool once earlier today. She reports 2 episodes diarrhea today and 4 episodes of vomiting today. Patient reports she's has a history of elevated blood pressures but is not currently prescribed antihypertensives. Patient denies current shortness of breath, palpitations, hematemesis, dysuria, hematuria, wheezing, rashes, or sick contacts. Patient denies personal or family history of cardiovascular disease. Patient denies personal family history of DVTs or PEs.  (Consider location/radiation/quality/duration/timing/severity/associated sxs/prior Treatment) HPI  Past Medical History  Diagnosis Date  . Colitis   . Hypertension    Past Surgical History  Procedure Laterality Date  . Tubal ligation     Family History  Problem Relation Age of Onset  . Hypertension Other   . Diabetes Mellitus II Other    History  Substance Use Topics  . Smoking status: Never Smoker   . Smokeless tobacco: Never Used  . Alcohol Use: No   OB  History    No data available     Review of Systems  Constitutional: Positive for fever and chills.  HENT: Positive for congestion, ear pain, rhinorrhea, sinus pressure, sneezing and sore throat. Negative for dental problem, ear discharge, mouth sores, trouble swallowing and voice change.   Eyes: Positive for visual disturbance. Negative for pain and redness.  Respiratory: Positive for cough and chest tightness. Negative for choking, wheezing and stridor.   Cardiovascular: Positive for chest pain. Negative for palpitations and leg swelling.  Gastrointestinal: Positive for nausea, vomiting, abdominal pain, diarrhea and blood in stool.  Genitourinary: Negative for dysuria, urgency, frequency, hematuria, flank pain and difficulty urinating.  Musculoskeletal: Positive for myalgias. Negative for gait problem, neck pain and neck stiffness.  Skin: Negative for rash and wound.  Neurological: Positive for headaches. Negative for dizziness, weakness, light-headedness and numbness.  All other systems reviewed and are negative.     Allergies  Review of patient's allergies indicates no known allergies.  Home Medications   Prior to Admission medications   Medication Sig Start Date End Date Taking? Authorizing Provider  lisinopril (PRINIVIL,ZESTRIL) 5 MG tablet Take 1 tablet (5 mg total) by mouth daily. 03/10/13  Yes Maryann Mikhail, DO  ciprofloxacin (CIPRO) 500 MG tablet Take 1 tablet (500 mg total) by mouth 2 (two) times daily. Patient not taking: Reported on 01/09/2014 03/10/13   Velta Addison Mikhail, DO  dextromethorphan-guaiFENesin [facility] DM) 30-600 MG per 12 hr tablet Take 1 tablet by mouth 2 (two) times daily. 01/09/14   Verda Cumins Javian Nudd, PA-C  HYDROcodone-acetaminophen (NORCO/VICODIN) 5-325 MG per tablet  Take 1 tablet by mouth every 4 (four) hours as needed for moderate pain or severe pain. 01/09/14   Verda Cumins Desarae Placide, PA-C  metroNIDAZOLE (FLAGYL) 500 MG tablet Take 1 tablet (500 mg total)  by mouth 3 (three) times daily. Patient not taking: Reported on 01/09/2014 03/10/13   Velta Addison Mikhail, DO  promethazine (PHENERGAN) 25 MG tablet Take 1 tablet (25 mg total) by mouth every 6 (six) hours as needed for nausea or vomiting. 01/09/14   Verda Cumins Azreal Stthomas, PA-C   BP 183/100 mmHg  Pulse 61  Temp(Src) 98.6 F (37 C) (Oral)  Resp 17  Ht 5\' 7"  (1.702 m)  Wt 210 lb (95.255 kg)  BMI 32.88 kg/m2  SpO2 99%  LMP 12/31/2013 Physical Exam  Constitutional: She appears well-developed and well-nourished. No distress.  HENT:  Head: Normocephalic and atraumatic.  Right Ear: External ear normal.  Left Ear: External ear normal.  Nose: Nose normal.  Mouth/Throat: Oropharynx is clear and moist. No oropharyngeal exudate.  Eyes: Conjunctivae are normal. Pupils are equal, round, and reactive to light. Right eye exhibits no discharge. Left eye exhibits no discharge.  Neck: Normal range of motion. Neck supple.  Cardiovascular: Normal rate, normal heart sounds and intact distal pulses.  Exam reveals no gallop and no friction rub.   No murmur heard. Heart rate 88.   Pulmonary/Chest: Effort normal and breath sounds normal. No respiratory distress. She has no wheezes. She has no rales. She exhibits tenderness.  Mid chest is slightly tender to palpation.   Abdominal: Soft. Bowel sounds are normal. She exhibits no distension and no mass. There is tenderness. There is no rebound and no guarding.  Abdomen is soft. There is mild lower abdominal tenderness bilaterally. No rebound tenderness. Negative Rovsing sign. Negative psoas and obturator sign.  Musculoskeletal: She exhibits no edema.  Neurological: She is alert. Coordination normal.  Skin: Skin is warm. No rash noted. She is not diaphoretic. No erythema. No pallor.  Skin is warm to touch.   Nursing note and vitals reviewed.   ED Course  Procedures (including critical care time) Labs Review Labs Reviewed  CBC - Abnormal; Notable for the  following:    WBC 16.7 (*)    Platelets 408 (*)    All other components within normal limits  URINALYSIS, ROUTINE W REFLEX MICROSCOPIC - Abnormal; Notable for the following:    APPearance CLOUDY (*)    Hgb urine dipstick LARGE (*)    Ketones, ur 40 (*)    Protein, ur 30 (*)    All other components within normal limits  URINE MICROSCOPIC-ADD ON - Abnormal; Notable for the following:    Squamous Epithelial / LPF MANY (*)    Bacteria, UA FEW (*)    Crystals CA OXALATE CRYSTALS (*)    All other components within normal limits  URINE CULTURE  BASIC METABOLIC PANEL  PREGNANCY, URINE  I-STAT TROPOININ, ED    Imaging Review Dg Chest 2 View  01/09/2014   CLINICAL DATA:  38 year old female with chest pain and left upper abdominal pain for 2 days. Productive cough with congestion and emesis. Initial encounter.  EXAM: CHEST  2 VIEW  COMPARISON:  Chest and rib radiographs 06/19/2008. CT Abdomen and Pelvis 03/05/2013  FINDINGS: Normal lung volumes. Normal cardiac size and mediastinal contours. Visualized tracheal air column is within normal limits. Mild asymmetric right first rib costochondral calcification suspected and stable. No pneumothorax, pulmonary edema, pleural effusion or confluent pulmonary opacity. No pneumoperitoneum. Negative visible upper abdominal  bowel gas. No osseous abnormality identified.  IMPRESSION: Negative, no acute cardiopulmonary abnormality.   Electronically Signed   By: Lars Pinks M.D.   On: 01/09/2014 17:03   Ct Abdomen Pelvis W Contrast  01/09/2014   CLINICAL DATA:  38 year old female with severe abdominal and pelvic pain with nausea and vomiting.  EXAM: CT ABDOMEN AND PELVIS WITH CONTRAST  TECHNIQUE: Multidetector CT imaging of the abdomen and pelvis was performed using the standard protocol following bolus administration of intravenous contrast.  CONTRAST:  140mL OMNIPAQUE IOHEXOL 300 MG/ML  SOLN  COMPARISON:  03/05/2013 and 12/08/2005 CTs  FINDINGS: The lung bases are  clear.  The liver, spleen, pancreas, adrenal glands and kidneys are unremarkable except for an unchanged probable 9 mm left renal cyst.  Mild focal prominence of the gallbladder tip is noted, but unchanged from 2007.  There is no evidence of free fluid, enlarged lymph nodes, biliary dilation or abdominal aortic aneurysm.  The bowel, bladder and appendix are unremarkable. There is no evidence of bowel obstruction, abscess or pneumoperitoneum.  Uterus and adnexal regions are unremarkable.  No acute or suspicious bony abnormalities are identified.  IMPRESSION: No acute or significant abnormalities identified.   Electronically Signed   By: Hassan Rowan M.D.   On: 01/09/2014 20:36     EKG Interpretation None     Filed Vitals:   01/09/14 1944 01/09/14 1945 01/09/14 2036 01/09/14 2100  BP: 164/103 164/103 158/100 183/100  Pulse: 74 77 75 61  Temp: 98.6 F (37 C)     TempSrc: Oral     Resp: 20  17   Height:      Weight:      SpO2: 98% 99% 100% 99%    MDM   Meds given in ED:  Medications  ondansetron (ZOFRAN-ODT) disintegrating tablet 8 mg (8 mg Oral Given 01/09/14 1519)  promethazine (PHENERGAN) injection 12.5 mg (12.5 mg Intravenous Given 01/09/14 1743)  sodium chloride 0.9 % bolus 1,000 mL (0 mLs Intravenous Stopped 01/09/14 1933)  morphine 4 MG/ML injection 4 mg (4 mg Intravenous Given 01/09/14 1749)  iohexol (OMNIPAQUE) 300 MG/ML solution 25 mL (25 mLs Oral Contrast Given 01/09/14 1638)  iohexol (OMNIPAQUE) 300 MG/ML solution 100 mL (100 mLs Intravenous Contrast Given 01/09/14 2001)  promethazine (PHENERGAN) injection 12.5 mg (12.5 mg Intravenous Given 01/09/14 2055)  acetaminophen (TYLENOL) tablet 650 mg (650 mg Oral Given 01/09/14 2141)  dextromethorphan-guaiFENesin (MUCINEX DM) 30-600 MG per 12 hr tablet 1 tablet (1 tablet Oral Given 01/09/14 2141)    Discharge Medication List as of 01/09/2014  9:37 PM    START taking these medications   Details  dextromethorphan-guaiFENesin (MUCINEX DM)  30-600 MG per 12 hr tablet Take 1 tablet by mouth 2 (two) times daily., Starting 01/09/2014, Until Discontinued, Print    HYDROcodone-acetaminophen (NORCO/VICODIN) 5-325 MG per tablet Take 1 tablet by mouth every 4 (four) hours as needed for moderate pain or severe pain., Starting 01/09/2014, Until Discontinued, Print    promethazine (PHENERGAN) 25 MG tablet Take 1 tablet (25 mg total) by mouth every 6 (six) hours as needed for nausea or vomiting., Starting 01/09/2014, Until Discontinued, Print        Final diagnoses:  Nausea vomiting and diarrhea  Upper respiratory infection    Cassandra Greer is a 38 y.o. female with a history of colitis who presents to the ED complaining of low abdominal pain, body aches, subjective fever, vomiting, diarrhea, midsternal chest pain, fevers and cough. Patient's chest pain is worse  with coughing and her pain is reproducible with palpation. Patient is afebrile and nontoxic appearing. Patient had some bright red blood streaked in her stool one time today. The patient's abdomen is mildly tender across her lower abdomen.  He had a negative urine pregnancy test. The patient's urinalysis was consistent with a dirty catch but will send for culture to be sure. The patient had a negative troponin. The patient's CBC has an elevated white count at 16.7, but is otherwise unremarkable. Patient's BMP was unremarkable. [facility] had an unremarkable chest x-ray. The patient unremarkable CT of abdomen and pelvis with contrast. The patient's findings are consistent with an upper respiratory infection. Education provided on symptomatic treatment of upper respiratory infections. The patient was discharged with Mucinex DM, Norco for pain control and Phenergan for nausea. Advised the patient to follow-up with her primary care physician this week to manage her high blood pressure. Advised patient to return to the emergency department with new or worsening symptoms or new concerns. The  patient verbalized understanding and agreement with plan.  This patient was discussed with and evaluated by Dr. Rogene Houston who agrees with assessment and plan.        Hanley Hays, PA-C 01/10/14 [phone]

## 2014-01-09 NOTE — ED Notes (Signed)
pts labs sent to main lab. Pts i stat in mini lab on hold.

## 2014-01-11 LAB — URINE CULTURE
Colony Count: >=100000
SPECIAL REQUESTS: NORMAL

## 2015-11-03 ENCOUNTER — Emergency Department (HOSPITAL_COMMUNITY)
Admission: EM | Admit: 2015-11-03 | Discharge: 2015-11-04 | Disposition: A | Discharge status: Home / Self Care | Payer: Self-pay | Attending: Emergency Medicine | Admitting: Emergency Medicine

## 2015-11-03 ENCOUNTER — Emergency Department (HOSPITAL_COMMUNITY): Payer: Self-pay

## 2015-11-03 ENCOUNTER — Encounter (HOSPITAL_COMMUNITY): Payer: Self-pay | Admitting: Emergency Medicine

## 2015-11-03 DIAGNOSIS — I10 Essential (primary) hypertension: Secondary | ICD-10-CM | POA: Insufficient documentation

## 2015-11-03 DIAGNOSIS — R079 Chest pain, unspecified: Secondary | ICD-10-CM

## 2015-11-03 DIAGNOSIS — K5090 Crohn's disease, unspecified, without complications: Secondary | ICD-10-CM | POA: Insufficient documentation

## 2015-11-03 DIAGNOSIS — R0789 Other chest pain: Secondary | ICD-10-CM | POA: Insufficient documentation

## 2015-11-03 DIAGNOSIS — K5900 Constipation, unspecified: Secondary | ICD-10-CM

## 2015-11-03 DIAGNOSIS — R1084 Generalized abdominal pain: Secondary | ICD-10-CM

## 2015-11-03 DIAGNOSIS — Z79899 Other long term (current) drug therapy: Secondary | ICD-10-CM | POA: Insufficient documentation

## 2015-11-03 LAB — COMPREHENSIVE METABOLIC PANEL
ALT: 14 U/L (ref 14–54)
ANION GAP: 12 (ref 5–15)
AST: 14 U/L — AB (ref 15–41)
Albumin: 3.4 g/dL — ABNORMAL LOW (ref 3.5–5.0)
Alkaline Phosphatase: 76 U/L (ref 38–126)
BILIRUBIN TOTAL: 0.4 mg/dL (ref 0.3–1.2)
BUN: 10 mg/dL (ref 6–20)
CO2: 23 mmol/L (ref 22–32)
Calcium: 8.7 mg/dL — ABNORMAL LOW (ref 8.9–10.3)
Chloride: 100 mmol/L — ABNORMAL LOW (ref 101–111)
Creatinine, Ser: 0.81 mg/dL (ref 0.44–1.00)
Glucose, Bld: 163 mg/dL — ABNORMAL HIGH (ref 65–99)
POTASSIUM: 3.4 mmol/L — AB (ref 3.5–5.1)
Sodium: 135 mmol/L (ref 135–145)
Total Protein: 6.8 g/dL (ref 6.5–8.1)

## 2015-11-03 LAB — LIPASE, BLOOD: Lipase: 50 U/L (ref 11–51)

## 2015-11-03 LAB — URINALYSIS, ROUTINE W REFLEX MICROSCOPIC
Bilirubin Urine: NEGATIVE
Glucose, UA: NEGATIVE mg/dL
Ketones, ur: NEGATIVE mg/dL
Leukocytes, UA: NEGATIVE
NITRITE: NEGATIVE
PH: 7 (ref 5.0–8.0)
PROTEIN: NEGATIVE mg/dL
Specific Gravity, Urine: <1.005 — ABNORMAL LOW (ref 1.005–1.030)

## 2015-11-03 LAB — CBC
HEMATOCRIT: 37.5 % (ref 36.0–46.0)
Hemoglobin: 11.8 g/dL — ABNORMAL LOW (ref 12.0–15.0)
MCH: 28.4 pg (ref 26.0–34.0)
MCHC: 31.5 g/dL (ref 30.0–36.0)
MCV: 90.1 fL (ref 78.0–100.0)
PLATELETS: 386 10*3/uL (ref 150–400)
RBC: 4.16 MIL/uL (ref 3.87–5.11)
RDW: 13.9 % (ref 11.5–15.5)
WBC: 12.2 10*3/uL — ABNORMAL HIGH (ref 4.0–10.5)

## 2015-11-03 LAB — URINE MICROSCOPIC-ADD ON

## 2015-11-03 LAB — POC URINE PREG, ED: Preg Test, Ur: NEGATIVE

## 2015-11-03 MED ORDER — MORPHINE SULFATE (PF) 2 MG/ML IV SOLN: 2.0000 mg | Freq: Once | INTRAVENOUS | Status: DC

## 2015-11-03 MED ORDER — SODIUM CHLORIDE 0.9 % IV BOLUS (SEPSIS)
1000.0000 mL | Freq: Once | INTRAVENOUS | Status: AC
  Administered 2015-11-03: 1000 mL via INTRAVENOUS

## 2015-11-03 MED ORDER — ONDANSETRON HCL 4 MG/2ML IJ SOLN
4.0000 mg | Freq: Once | INTRAMUSCULAR | Status: AC
  Administered 2015-11-03: 4 mg via INTRAVENOUS
  Filled 2015-11-03: qty 2

## 2015-11-03 MED ORDER — MORPHINE SULFATE (PF) 4 MG/ML IV SOLN
4.0000 mg | Freq: Once | INTRAVENOUS | Status: DC
  Administered 2015-11-03: 2 mg via INTRAVENOUS
  Filled 2015-11-03: qty 1

## 2015-11-03 NOTE — ED Provider Notes (Signed)
[place] DEPT Provider Note   CSN: [id number] Arrival date & time: 11/03/15  2225     History   Chief Complaint Chief Complaint  Patient presents with  . Abdominal Pain  . Hematochezia    HPI Cassandra Greer is a 40 y.o. female with a hx of HTN, Colitis presents to the Emergency Department complaining of gradual, persistent, progressively worsening abdominal pain onset 1 week ago. Associated symptoms include black and red diarrhea.  She reports 2 days of feeling generally weak and unwell.  Pt reports she has been taking ibuprofen without relief.  She reports a friend gave her a "tablet" yesterday to help, but doesn't know what it was.  She reports she has been confused since that time.  Pt reports she took a "handfull of Xanax" today because she wanted the pain to stop.  She denies feeling suicidal or taking the medication in an effort to die. Nothing makes it better and nothing makes it worse.  Pt denies fever, chills, headache, neck pain, chest pain, SOB, syncope, dysuria.  Pt denies hx of abdominal surgery.  She did not ever follow-up with GI after the initial dx of colitis.     The history is provided by the patient and medical records. No language interpreter was used.    Past Medical History:  Diagnosis Date  . Colitis   . Hypertension     Patient Active Problem List   Diagnosis Date Noted  . HTN (hypertension) 03/08/2013  . Hypokalemia 03/08/2013  . Headache(784.0) 03/08/2013  . Colitis 03/05/2013  . Leucocytosis 03/05/2013  . Sinusitis 03/05/2013    Past Surgical History:  Procedure Laterality Date  . TUBAL LIGATION      OB History    No data available       Home Medications    Prior to Admission medications   Medication Sig Start Date End Date Taking? Authorizing Provider  ciprofloxacin (CIPRO) 500 MG tablet Take 1 tablet (500 mg total) by mouth 2 (two) times daily. Patient not taking: Reported on 11/03/2015 03/10/13   Velta Addison Mikhail, DO    dextromethorphan-guaiFENesin [facility] DM) 30-600 MG per 12 hr tablet Take 1 tablet by mouth 2 (two) times daily. Patient not taking: Reported on 11/03/2015 01/09/14   Waynetta Pean, PA-C  HYDROcodone-acetaminophen (NORCO/VICODIN) 5-325 MG per tablet Take 1 tablet by mouth every 4 (four) hours as needed for moderate pain or severe pain. Patient not taking: Reported on 11/03/2015 01/09/14   Waynetta Pean, PA-C  lisinopril (PRINIVIL,ZESTRIL) 5 MG tablet Take 1 tablet (5 mg total) by mouth daily. Patient not taking: Reported on 11/03/2015 03/10/13   Velta Addison Mikhail, DO  metroNIDAZOLE (FLAGYL) 500 MG tablet Take 1 tablet (500 mg total) by mouth 3 (three) times daily. Patient not taking: Reported on 11/03/2015 03/10/13   Velta Addison Mikhail, DO  promethazine (PHENERGAN) 25 MG tablet Take 1 tablet (25 mg total) by mouth every 6 (six) hours as needed for nausea or vomiting. Patient not taking: Reported on 11/03/2015 01/09/14   Waynetta Pean, PA-C    Family History Family History  Problem Relation Age of Onset  . Hypertension Other   . Diabetes Mellitus II Other     Social History Social History  Substance Use Topics  . Smoking status: Never Smoker  . Smokeless tobacco: Never Used  . Alcohol use No     Allergies   Review of patient's allergies indicates no known allergies.   Review of Systems Review of Systems  Respiratory: Negative for  shortness of breath.   Cardiovascular: Positive for chest pain ( pressure).  Gastrointestinal: Positive for abdominal pain, blood in stool and diarrhea.  All other systems reviewed and are negative.    Physical Exam Updated Vital Signs BP (!) 161/114 (BP Location: Right Arm)   Temp 98.3 F (36.8 C) (Oral)   Resp 20   Ht 5\' 6"  (1.676 m)   Wt 109.3 kg   LMP 10/06/2015 (Exact Date)   SpO2 96%   BMI 38.90 kg/m   Physical Exam  Constitutional: She appears well-developed and well-nourished. No distress.  Awake, alert, nontoxic appearance  HENT:   Head: Normocephalic and atraumatic.  Mouth/Throat: Oropharynx is clear and moist. No oropharyngeal exudate.  Eyes: Conjunctivae are normal. No scleral icterus.  Neck: Normal range of motion. Neck supple.  Cardiovascular: Normal rate, regular rhythm and intact distal pulses.   Pulmonary/Chest: Effort normal and breath sounds normal. No respiratory distress. She has no wheezes.  Equal chest expansion  Abdominal: Soft. Bowel sounds are normal. She exhibits no distension and no mass. There is generalized tenderness. There is no rebound and no guarding.  Genitourinary: Rectal exam shows no external hemorrhoid, no internal hemorrhoid, no fissure, no mass, no tenderness, anal tone normal and guaiac negative stool. Pelvic exam was performed with patient in the knee-chest position.  Genitourinary Comments: DRE without abnormality. No gross blood. No large volume of stool in the rectal vault.  Musculoskeletal: Normal range of motion. She exhibits no edema.  Neurological: She is alert.  Speech is clear and goal oriented Moves extremities without ataxia  Skin: Skin is warm and dry. She is not diaphoretic.  Psychiatric: She has a normal mood and affect.  Nursing note and vitals reviewed.    ED Treatments / Results  Labs (all labs ordered are listed, but only abnormal results are displayed) Labs Reviewed  COMPREHENSIVE METABOLIC PANEL - Abnormal; Notable for the following:       Result Value   Potassium 3.4 (*)    Chloride 100 (*)    Glucose, Bld 163 (*)    Calcium 8.7 (*)    Albumin 3.4 (*)    AST 14 (*)    All other components within normal limits  CBC - Abnormal; Notable for the following:    WBC 12.2 (*)    Hemoglobin 11.8 (*)    All other components within normal limits  URINALYSIS, ROUTINE W REFLEX MICROSCOPIC (NOT AT [facility]) - Abnormal; Notable for the following:    Color, Urine STRAW (*)    Specific Gravity, Urine <1.005 (*)    Hgb urine dipstick MODERATE (*)    All other  components within normal limits  URINE RAPID DRUG SCREEN, HOSP PERFORMED - Abnormal; Notable for the following:    Benzodiazepines POSITIVE (*)    Tetrahydrocannabinol POSITIVE (*)    All other components within normal limits  URINE MICROSCOPIC-ADD ON - Abnormal; Notable for the following:    Squamous Epithelial / LPF 6-30 (*)    Bacteria, UA MANY (*)    All other components within normal limits  LIPASE, BLOOD  POC URINE PREG, ED  POC OCCULT BLOOD, ED  I-STAT TROPOININ, ED    EKG  EKG Interpretation  Date/Time:  Sunday November 03 2015 23:23:45 EDT Ventricular Rate:  93 PR Interval:    QRS Duration: 102 QT Interval:  393 QTC Calculation: 489 R Axis:   -8 Text Interpretation:  Sinus rhythm Probable left atrial enlargement Borderline prolonged QT interval Baseline wander in  lead(s) V3 V4 No prior for comparison Confirmed by HORTON  MD, COURTNEY ([id number]) on 11/04/2015 4:31:21 AM       Radiology Ct Abdomen Pelvis W Contrast  Result Date: 11/04/2015 CLINICAL DATA:  Acute onset severe low pelvic pain with nausea this afternoon. Pain now localizing to the RIGHT. History of colitis. EXAM: CT ABDOMEN AND PELVIS WITH CONTRAST TECHNIQUE: Multidetector CT imaging of the abdomen and pelvis was performed using the standard protocol following bolus administration of intravenous contrast. CONTRAST:  148mL ISOVUE-300 IOPAMIDOL (ISOVUE-300) INJECTION 61% COMPARISON:  CT abdomen and pelvis January 09, 2014 FINDINGS: LOWER CHEST: 3 mm RIGHT middle lobe sub solid nodule, below size followup recommendation. HEPATOBILIARY: Liver is normal.  Gallbladder is decompressed. PANCREAS: Normal. SPLEEN: Normal. ADRENALS/URINARY TRACT: Kidneys are orthotopic, demonstrating symmetric enhancement. No nephrolithiasis, hydronephrosis or solid renal masses. 10 mm cyst LEFT lower pole, unchanged. The unopacified ureters are normal in course and caliber. Urinary bladder is well distended and unremarkable. Normal adrenal  glands. STOMACH/BOWEL: The stomach, small and large bowel are normal in course and caliber without inflammatory changes. Moderate amount of retained large bowel stool. Small bowel feces compatible with chronic stasis. Debris within stomach which is mildly distended. Normal appendix. VASCULAR/LYMPHATIC: Aortoiliac vessels are normal in course and caliber from with trace calcific atherosclerosis. No lymphadenopathy by CT size criteria. REPRODUCTIVE: Normal. OTHER: No intraperitoneal free fluid or free air. MUSCULOSKELETAL: Nonacute. Similar bilateral sacroiliac osteoarthrosis. IMPRESSION: No acute intra-abdominal pelvic process. Moderate amount of retained large bowel stool.  Normal appendix. Electronically Signed   By: Elon Alas M.D.   On: 11/04/2015 01:49   Dg Chest Port 1 View  Result Date: 11/04/2015 CLINICAL DATA:  40 year old female with chest pressure and shortness of breath EXAM: PORTABLE CHEST 1 VIEW COMPARISON:  Chest radiograph dated 01/09/2014 FINDINGS: The heart size and mediastinal contours are within normal limits. Both lungs are clear. The visualized skeletal structures are unremarkable. IMPRESSION: No active disease. Electronically Signed   By: Anner Crete M.D.   On: 11/04/2015 00:52    Procedures Procedures (including critical care time)  Medications Ordered in ED Medications  ondansetron (ZOFRAN) injection 4 mg (4 mg Intravenous Given 11/03/15 2358)  sodium chloride 0.9 % bolus 1,000 mL (0 mLs Intravenous Stopped 11/04/15 0238)  iopamidol (ISOVUE-300) 61 % injection 100 mL (100 mLs Intravenous Contrast Given 11/04/15 0054)  ondansetron (ZOFRAN) injection 4 mg (4 mg Intravenous Given 11/04/15 0148)  metoCLOPramide (REGLAN) injection 10 mg (10 mg Intravenous Given 11/04/15 0316)     Initial Impression / Assessment and Plan / ED Course  I have reviewed the triage vital signs and the nursing notes.  Pertinent labs & imaging results that were available during my care of the  patient were reviewed by me and considered in my medical decision making (see chart for details).  Clinical Course  Value Comment By Time  WBC: (!) 12.2 Mild leukocytosis Abigail Butts, PA-C 09/24 2320  Hemoglobin: (!) 11.8 Mild anemia Abigail Butts, PA-C 09/24 2321  Potassium: (!) 3.4 Mild hypokalemia. Jarrett Soho Gwenith Tschida, PA-C 09/25 0159  Leukocytes, UA: NEGATIVE No evidence of UTI. Jarrett Soho Norell Brisbin, PA-C 09/25 0200  DG Chest Port 1 View No active disease. No pneumonia or pneumothorax. Jarrett Soho Keiandre Cygan, PA-C 09/25 0200  CT ABDOMEN PELVIS W CONTRAST Large volume of stool but no evidence of active colitis, diverticulitis, abscess, bowel perforation. Jarrett Soho Jayelyn Barno, PA-C 09/25 0200   Patient continues to complain of nausea. Reglan given. Jarrett Soho Tarren Velardi, PA-C 09/25 0344   No emesis  here in the department. Jarrett Soho Skila Rollins, PA-C 09/25 0344  BP: (!) 172/102 Remains hypertensive. She reports she does not take medication at home for this. Abigail Butts, PA-C 09/25 0345   After evaluation by Dr. Dina Rich patient removed her own IV and exited the department without discussing her departure with anyone. Jarrett Soho Devina Bezold, PA-C 09/25 0350    Pt with abdominal and chest pain.  She is well appearing on exam and abd is tender without rebound or guarding.  Labs are reassuring. CT with constipation, but not evidence of colitis.  She is hypertensive, but reports she does not take medications for same. Pt labs are reassuring.  Doubt ACS at this time.  No tachycardia to suggest PE and pt without risk factors or hx of same.  ECG reassuring and troponin neg 6 hours after onset of chest pressure.    Findings discussed with patient and pt was evaluated by Dr. Dina Rich.  Pt then removed her own IV and eloped from the department.   Final Clinical Impressions(s) / ED Diagnoses   Final diagnoses:  Generalized abdominal pain  Constipation, unspecified constipation type  Chest pain,  unspecified chest pain type    New Prescriptions Discharge Medication List as of 11/04/2015  4:02 AM       Abigail Butts, PA-C 11/04/15 [place], MD 11/04/15 [phone]

## 2015-11-03 NOTE — ED Triage Notes (Signed)
Diarrhea and abdominal pain for a week. Nausea present, no vomiting. Reports diarrhea is black and red. Two days ago generally feeling weak and confused. Denies feeling fevers or chills at home.

## 2015-11-03 NOTE — ED Notes (Signed)
EKG obtained and given to Horton MD, pt was reporting central CP

## 2015-11-04 ENCOUNTER — Emergency Department (HOSPITAL_COMMUNITY): Payer: Self-pay

## 2015-11-04 LAB — RAPID URINE DRUG SCREEN, HOSP PERFORMED
Amphetamines: NOT DETECTED
BARBITURATES: NOT DETECTED
BENZODIAZEPINES: POSITIVE — AB
Cocaine: NOT DETECTED
Opiates: NOT DETECTED
Tetrahydrocannabinol: POSITIVE — AB

## 2015-11-04 LAB — POC OCCULT BLOOD, ED: FECAL OCCULT BLD: NEGATIVE

## 2015-11-04 LAB — I-STAT TROPONIN, ED: TROPONIN I, POC: 0.01 ng/mL (ref 0.00–0.08)

## 2015-11-04 MED ORDER — ONDANSETRON HCL 4 MG/2ML IJ SOLN
4.0000 mg | Freq: Once | INTRAMUSCULAR | Status: AC
  Administered 2015-11-04: 4 mg via INTRAVENOUS
  Filled 2015-11-04: qty 2

## 2015-11-04 MED ORDER — IOPAMIDOL (ISOVUE-300) INJECTION 61%
100.0000 mL | Freq: Once | INTRAVENOUS | Status: AC | PRN
  Administered 2015-11-04: 100 mL via INTRAVENOUS

## 2015-11-04 MED ORDER — METOCLOPRAMIDE HCL 5 MG/ML IJ SOLN
10.0000 mg | Freq: Once | INTRAMUSCULAR | Status: AC
  Administered 2015-11-04: 10 mg via INTRAVENOUS
  Filled 2015-11-04: qty 2

## 2015-11-04 NOTE — ED Notes (Signed)
Upon entering pt room, pt no where to be found. IV found on floor. EDP aware pt eloped.

## 2015-11-04 NOTE — ED Notes (Signed)
Pt states she is nauseous, EDP aware

## 2016-04-24 ENCOUNTER — Emergency Department (HOSPITAL_COMMUNITY): Payer: Self-pay

## 2016-04-24 ENCOUNTER — Encounter (HOSPITAL_COMMUNITY): Payer: Self-pay

## 2016-04-24 ENCOUNTER — Emergency Department (HOSPITAL_COMMUNITY)
Admission: EM | Admit: 2016-04-24 | Discharge: 2016-04-24 | Disposition: A | Discharge status: Home / Self Care | Payer: Self-pay | Attending: Emergency Medicine | Admitting: Emergency Medicine

## 2016-04-24 DIAGNOSIS — R1011 Right upper quadrant pain: Secondary | ICD-10-CM | POA: Insufficient documentation

## 2016-04-24 DIAGNOSIS — Z79899 Other long term (current) drug therapy: Secondary | ICD-10-CM | POA: Insufficient documentation

## 2016-04-24 DIAGNOSIS — I10 Essential (primary) hypertension: Secondary | ICD-10-CM | POA: Insufficient documentation

## 2016-04-24 LAB — CBC
HCT: 41 % (ref 36.0–46.0)
Hemoglobin: 13.4 g/dL (ref 12.0–15.0)
MCH: 27.8 pg (ref 26.0–34.0)
MCHC: 32.7 g/dL (ref 30.0–36.0)
MCV: 85.1 fL (ref 78.0–100.0)
PLATELETS: 428 10*3/uL — AB (ref 150–400)
RBC: 4.82 MIL/uL (ref 3.87–5.11)
RDW: 13.9 % (ref 11.5–15.5)
WBC: 18.3 10*3/uL — AB (ref 4.0–10.5)

## 2016-04-24 LAB — URINALYSIS, ROUTINE W REFLEX MICROSCOPIC
Bilirubin Urine: NEGATIVE
Glucose, UA: NEGATIVE mg/dL
KETONES UR: 5 mg/dL — AB
Nitrite: NEGATIVE
PROTEIN: 100 mg/dL — AB
Specific Gravity, Urine: 1.027 (ref 1.005–1.030)
pH: 6 (ref 5.0–8.0)

## 2016-04-24 LAB — BASIC METABOLIC PANEL
Anion gap: 10 (ref 5–15)
BUN: 15 mg/dL (ref 6–20)
CO2: 25 mmol/L (ref 22–32)
CREATININE: 0.66 mg/dL (ref 0.44–1.00)
Calcium: 8.5 mg/dL — ABNORMAL LOW (ref 8.9–10.3)
Chloride: 101 mmol/L (ref 101–111)
Glucose, Bld: 155 mg/dL — ABNORMAL HIGH (ref 65–99)
POTASSIUM: 3.7 mmol/L (ref 3.5–5.1)
SODIUM: 136 mmol/L (ref 135–145)

## 2016-04-24 LAB — CBG MONITORING, ED: Glucose-Capillary: 157 mg/dL — ABNORMAL HIGH (ref 65–99)

## 2016-04-24 MED ORDER — SODIUM CHLORIDE 0.9 % IV BOLUS (SEPSIS)
1000.0000 mL | Freq: Once | INTRAVENOUS | Status: AC
  Administered 2016-04-24: 1000 mL via INTRAVENOUS

## 2016-04-24 MED ORDER — IOPAMIDOL (ISOVUE-300) INJECTION 61%
INTRAVENOUS | Status: AC
  Filled 2016-04-24: qty 100

## 2016-04-24 MED ORDER — ONDANSETRON HCL 4 MG/2ML IJ SOLN
4.0000 mg | Freq: Once | INTRAMUSCULAR | Status: AC
Start: 2016-04-24 — End: 2016-04-24
  Administered 2016-04-24: 4 mg via INTRAVENOUS
  Filled 2016-04-24: qty 2

## 2016-04-24 MED ORDER — IOPAMIDOL (ISOVUE-300) INJECTION 61%
100.0000 mL | Freq: Once | INTRAVENOUS | Status: AC | PRN
  Administered 2016-04-24: 100 mL via INTRAVENOUS

## 2016-04-24 MED ORDER — HYDROMORPHONE HCL 1 MG/ML IJ SOLN
1.0000 mg | Freq: Once | INTRAMUSCULAR | Status: AC
  Administered 2016-04-24: 1 mg via INTRAVENOUS
  Filled 2016-04-24: qty 1

## 2016-04-24 MED ORDER — IOPAMIDOL (ISOVUE-300) INJECTION 61%
INTRAVENOUS | Status: AC
  Filled 2016-04-24: qty 30

## 2016-04-24 MED ORDER — IOPAMIDOL (ISOVUE-300) INJECTION 61%
30.0000 mL | Freq: Once | INTRAVENOUS | Status: AC | PRN
  Administered 2016-04-24: 30 mL via ORAL

## 2016-04-24 MED ORDER — ONDANSETRON 4 MG PO TBDP: ORAL_TABLET | ORAL | 0 refills | Status: DC

## 2016-04-24 MED ORDER — ONDANSETRON HCL 4 MG/2ML IJ SOLN
4.0000 mg | Freq: Once | INTRAMUSCULAR | Status: AC
  Administered 2016-04-24: 4 mg via INTRAVENOUS
  Filled 2016-04-24: qty 2

## 2016-04-24 MED ORDER — SODIUM CHLORIDE 0.9 % IV BOLUS (SEPSIS)
2000.0000 mL | Freq: Once | INTRAVENOUS | Status: AC
  Administered 2016-04-24: 2000 mL via INTRAVENOUS

## 2016-04-24 NOTE — Discharge Instructions (Signed)
Try zantac 150mg twice a day.  ° ° °

## 2016-04-24 NOTE — ED Notes (Signed)
DRINKING CT ORAL CONTRAST AND AWARE OF NEED FOR URINE HOWEVER UNABLE TO GO AT THIS TIME

## 2016-04-24 NOTE — ED Provider Notes (Signed)
41 yo F with abdominal pain.  Turned over from Dr. Roderic Palau, plan is for CT abd pelvis. If negative likely GI bug and d/c home.   On my repeat exam patient feels that her pain is significant worsening was prior to her arrival to the ED. She has also had recurrent nausea. I repeated abdominal exam and she has significant tenderness to the right upper quadrant. She had a positive Murphy sign. Obtain ultrasound to further evaluate.  [place] negative. Patient feeling better.  D/c home.      Deno Etienne, DO 04/24/16 2304

## 2016-04-24 NOTE — ED Notes (Signed)
Ultrasound present

## 2016-04-24 NOTE — ED Provider Notes (Signed)
[facility] DEPT Provider Note   CSN: [id number] Arrival date & time: 04/24/16  1153     History   Chief Complaint Chief Complaint  Patient presents with  . Emesis  . Loss of Consciousness    HPI Cassandra Greer is a 41 y.o. female.  Pt complains of vomiting and diarrhea.     The history is provided by the patient.  Emesis   This is a new problem. The current episode started 3 to 5 hours ago. The problem occurs 2 to 4 times per day. The problem has been resolved. The emesis has an appearance of stomach contents. There has been no fever. Associated symptoms include abdominal pain. Pertinent negatives include no cough, no diarrhea and no headaches. Risk factors include ill contacts.  Loss of Consciousness   Associated symptoms include abdominal pain and vomiting. Pertinent negatives include back pain, chest pain, congestion, headaches and seizures.    Past Medical History:  Diagnosis Date  . Colitis   . Hypertension     Patient Active Problem List   Diagnosis Date Noted  . HTN (hypertension) 03/08/2013  . Hypokalemia 03/08/2013  . Headache(784.0) 03/08/2013  . Colitis 03/05/2013  . Leucocytosis 03/05/2013  . Sinusitis 03/05/2013    Past Surgical History:  Procedure Laterality Date  . TUBAL LIGATION      OB History    No data available       Home Medications    Prior to Admission medications   Medication Sig Start Date End Date Taking? Authorizing Provider  gabapentin (NEURONTIN) 100 MG capsule Take 100 mg by mouth 3 (three) times daily.   Yes Historical Provider, MD  gabapentin (NEURONTIN) 300 MG capsule Take 300 mg by mouth at bedtime.   Yes Historical Provider, MD  lisinopril (PRINIVIL,ZESTRIL) 5 MG tablet Take 1 tablet (5 mg total) by mouth daily. 03/10/13  Yes Maryann Mikhail, DO  promethazine (PHENERGAN) 25 MG tablet Take 1 tablet (25 mg total) by mouth every 6 (six) hours as needed for nausea or vomiting. 01/09/14  Yes Waynetta Pean, PA-C    ciprofloxacin (CIPRO) 500 MG tablet Take 1 tablet (500 mg total) by mouth 2 (two) times daily. Patient not taking: Reported on 11/03/2015 03/10/13   Velta Addison Mikhail, DO  dextromethorphan-guaiFENesin [facility] DM) 30-600 MG per 12 hr tablet Take 1 tablet by mouth 2 (two) times daily. Patient not taking: Reported on 11/03/2015 01/09/14   Waynetta Pean, PA-C  HYDROcodone-acetaminophen (NORCO/VICODIN) 5-325 MG per tablet Take 1 tablet by mouth every 4 (four) hours as needed for moderate pain or severe pain. Patient not taking: Reported on 11/03/2015 01/09/14   Waynetta Pean, PA-C  metroNIDAZOLE (FLAGYL) 500 MG tablet Take 1 tablet (500 mg total) by mouth 3 (three) times daily. Patient not taking: Reported on 11/03/2015 03/10/13   Cristal Ford, DO    Family History Family History  Problem Relation Age of Onset  . Hypertension Other   . Diabetes Mellitus II Other     Social History Social History  Substance Use Topics  . Smoking status: Never Smoker  . Smokeless tobacco: Never Used  . Alcohol use No     Allergies   Patient has no known allergies.   Review of Systems Review of Systems  Constitutional: Negative for appetite change and fatigue.  HENT: Negative for congestion, ear discharge and sinus pressure.   Eyes: Negative for discharge.  Respiratory: Negative for cough.   Cardiovascular: Positive for syncope. Negative for chest pain.  Gastrointestinal: Positive for  abdominal pain and vomiting. Negative for diarrhea.  Genitourinary: Negative for frequency and hematuria.  Musculoskeletal: Negative for back pain.  Skin: Negative for rash.  Neurological: Negative for seizures and headaches.  Psychiatric/Behavioral: Negative for hallucinations.     Physical Exam Updated Vital Signs BP (!) 174/114   Pulse 100   Temp 99.1 F (37.3 C) (Oral)   Resp 15   Ht 5\' 6"  (1.676 m)   Wt 240 lb (108.9 kg)   LMP 04/22/2016   SpO2 91%   BMI 38.74 kg/m   Physical Exam  Constitutional:  She is oriented to person, place, and time. She appears well-developed.  HENT:  Head: Normocephalic.  Eyes: Conjunctivae and EOM are normal. No scleral icterus.  Neck: Neck supple. No thyromegaly present.  Cardiovascular: Normal rate and regular rhythm.  Exam reveals no gallop and no friction rub.   No murmur heard. Pulmonary/Chest: No stridor. She has no wheezes. She has no rales. She exhibits no tenderness.  Abdominal: She exhibits no distension. There is no tenderness. There is no rebound.  Musculoskeletal: Normal range of motion. She exhibits no edema.  Lymphadenopathy:    She has no cervical adenopathy.  Neurological: She is oriented to person, place, and time. She exhibits normal muscle tone. Coordination normal.  Skin: No rash noted. No erythema.  Psychiatric: She has a normal mood and affect. Her behavior is normal.     ED Treatments / Results  Labs (all labs ordered are listed, but only abnormal results are displayed) Labs Reviewed  BASIC METABOLIC PANEL - Abnormal; Notable for the following:       Result Value   Glucose, Bld 155 (*)    Calcium 8.5 (*)    All other components within normal limits  CBC - Abnormal; Notable for the following:    WBC 18.3 (*)    Platelets 428 (*)    All other components within normal limits  CBG MONITORING, ED - Abnormal; Notable for the following:    Glucose-Capillary 157 (*)    All other components within normal limits  URINALYSIS, ROUTINE W REFLEX MICROSCOPIC    EKG  EKG Interpretation None       Radiology No results found.  Procedures Procedures (including critical care time)  Medications Ordered in ED Medications  iopamidol (ISOVUE-300) 61 % injection (not administered)  iopamidol (ISOVUE-300) 61 % injection (not administered)  sodium chloride 0.9 % bolus 2,000 mL (2,000 mLs Intravenous New Bag/Given 04/24/16 1435)  ondansetron (ZOFRAN) injection 4 mg (4 mg Intravenous Given 04/24/16 1441)  HYDROmorphone (DILAUDID)  injection 1 mg (1 mg Intravenous Given 04/24/16 1441)  iopamidol (ISOVUE-300) 61 % injection 30 mL (30 mLs Oral Contrast Given 04/24/16 1424)  iopamidol (ISOVUE-300) 61 % injection 100 mL (100 mLs Intravenous Contrast Given 04/24/16 1556)     Initial Impression / Assessment and Plan / ED Course  I have reviewed the triage vital signs and the nursing notes.  Pertinent labs & imaging results that were available during my care of the patient were reviewed by me and considered in my medical decision making (see chart for details).     Ct abd pending  Final Clinical Impressions(s) / ED Diagnoses   Final diagnoses:  None    New Prescriptions New Prescriptions   No medications on file     Milton Ferguson, MD 04/24/16 1601

## 2016-04-24 NOTE — ED Triage Notes (Signed)
Per EMS, pt from home.  Pt c/o n/v/d since this morning.  Pt has cough.  Pt felt weak and leaned to wall and feels like she passed out.  No fall or trauma.  Cold sweats post.  Vitals:  190/122, hr 120, resp 18, cbg 176

## 2016-04-24 NOTE — ED Notes (Signed)
PT HAS CALLED FOR HER HUSBAND AND HE WILL PICK HER UP IN TRIAGE AREA

## 2016-04-24 NOTE — ED Notes (Signed)
Pt not in room to update VS

## 2016-12-21 ENCOUNTER — Inpatient Hospital Stay (HOSPITAL_COMMUNITY)
Admission: EM | Admit: 2016-12-21 | Discharge: 2016-12-24 | DRG: 313 | Discharge status: Against Medical Advice | Payer: Medicaid Other | Attending: Family Medicine | Admitting: Family Medicine

## 2016-12-21 ENCOUNTER — Other Ambulatory Visit: Payer: Self-pay

## 2016-12-21 ENCOUNTER — Encounter (HOSPITAL_COMMUNITY): Payer: Self-pay | Admitting: Emergency Medicine

## 2016-12-21 ENCOUNTER — Emergency Department (HOSPITAL_COMMUNITY): Payer: Medicaid Other

## 2016-12-21 DIAGNOSIS — Z23 Encounter for immunization: Secondary | ICD-10-CM

## 2016-12-21 DIAGNOSIS — R0789 Other chest pain: Principal | ICD-10-CM | POA: Diagnosis present

## 2016-12-21 DIAGNOSIS — K760 Fatty (change of) liver, not elsewhere classified: Secondary | ICD-10-CM

## 2016-12-21 DIAGNOSIS — E785 Hyperlipidemia, unspecified: Secondary | ICD-10-CM

## 2016-12-21 DIAGNOSIS — E1169 Type 2 diabetes mellitus with other specified complication: Secondary | ICD-10-CM

## 2016-12-21 DIAGNOSIS — D473 Essential (hemorrhagic) thrombocythemia: Secondary | ICD-10-CM

## 2016-12-21 DIAGNOSIS — R739 Hyperglycemia, unspecified: Secondary | ICD-10-CM

## 2016-12-21 DIAGNOSIS — I10 Essential (primary) hypertension: Secondary | ICD-10-CM | POA: Diagnosis present

## 2016-12-21 DIAGNOSIS — E1165 Type 2 diabetes mellitus with hyperglycemia: Secondary | ICD-10-CM | POA: Diagnosis present

## 2016-12-21 DIAGNOSIS — M4802 Spinal stenosis, cervical region: Secondary | ICD-10-CM | POA: Diagnosis present

## 2016-12-21 DIAGNOSIS — R079 Chest pain, unspecified: Secondary | ICD-10-CM | POA: Diagnosis present

## 2016-12-21 DIAGNOSIS — Z79899 Other long term (current) drug therapy: Secondary | ICD-10-CM

## 2016-12-21 DIAGNOSIS — F410 Panic disorder [episodic paroxysmal anxiety] without agoraphobia: Secondary | ICD-10-CM

## 2016-12-21 DIAGNOSIS — I160 Hypertensive urgency: Secondary | ICD-10-CM | POA: Diagnosis present

## 2016-12-21 DIAGNOSIS — R1011 Right upper quadrant pain: Secondary | ICD-10-CM

## 2016-12-21 DIAGNOSIS — R1013 Epigastric pain: Secondary | ICD-10-CM

## 2016-12-21 DIAGNOSIS — R7989 Other specified abnormal findings of blood chemistry: Secondary | ICD-10-CM | POA: Diagnosis present

## 2016-12-21 DIAGNOSIS — Z6839 Body mass index (BMI) 39.0-39.9, adult: Secondary | ICD-10-CM

## 2016-12-21 DIAGNOSIS — F411 Generalized anxiety disorder: Secondary | ICD-10-CM

## 2016-12-21 DIAGNOSIS — I159 Secondary hypertension, unspecified: Secondary | ICD-10-CM

## 2016-12-21 DIAGNOSIS — F1210 Cannabis abuse, uncomplicated: Secondary | ICD-10-CM | POA: Diagnosis present

## 2016-12-21 DIAGNOSIS — D75839 Thrombocytosis, unspecified: Secondary | ICD-10-CM

## 2016-12-21 DIAGNOSIS — D72829 Elevated white blood cell count, unspecified: Secondary | ICD-10-CM

## 2016-12-21 DIAGNOSIS — R064 Hyperventilation: Secondary | ICD-10-CM | POA: Diagnosis not present

## 2016-12-21 HISTORY — DX: Panic disorder (episodic paroxysmal anxiety): F41.0

## 2016-12-21 HISTORY — DX: Cannabis abuse, uncomplicated: F12.10

## 2016-12-21 HISTORY — DX: Hyperlipidemia, unspecified: E78.5

## 2016-12-21 HISTORY — DX: Chest pain, unspecified: R07.9

## 2016-12-21 HISTORY — DX: Morbid (severe) obesity due to excess calories: E66.01

## 2016-12-21 HISTORY — DX: Type 2 diabetes mellitus with other specified complication: E11.69

## 2016-12-21 LAB — RAPID URINE DRUG SCREEN, HOSP PERFORMED
Amphetamines: NOT DETECTED
BENZODIAZEPINES: POSITIVE — AB
Barbiturates: NOT DETECTED
COCAINE: NOT DETECTED
OPIATES: POSITIVE — AB
TETRAHYDROCANNABINOL: POSITIVE — AB

## 2016-12-21 LAB — CBC
HCT: 38.9 % (ref 36.0–46.0)
HCT: 38.9 % (ref 36.0–46.0)
HEMOGLOBIN: 12.7 g/dL (ref 12.0–15.0)
HEMOGLOBIN: 12.5 g/dL (ref 12.0–15.0)
MCH: 28.3 pg (ref 26.0–34.0)
MCH: 28 pg (ref 26.0–34.0)
MCHC: 32.6 g/dL (ref 30.0–36.0)
MCHC: 32.1 g/dL (ref 30.0–36.0)
MCV: 86.8 fL (ref 78.0–100.0)
MCV: 87.2 fL (ref 78.0–100.0)
PLATELETS: 402 10*3/uL — AB (ref 150–400)
Platelets: 409 10*3/uL — ABNORMAL HIGH (ref 150–400)
RBC: 4.48 MIL/uL (ref 3.87–5.11)
RBC: 4.46 MIL/uL (ref 3.87–5.11)
RDW: 14.2 % (ref 11.5–15.5)
RDW: 14.1 % (ref 11.5–15.5)
WBC: 14.2 10*3/uL — AB (ref 4.0–10.5)
WBC: 15.2 10*3/uL — ABNORMAL HIGH (ref 4.0–10.5)

## 2016-12-21 LAB — HEPATIC FUNCTION PANEL
ALK PHOS: 92 U/L (ref 38–126)
ALT: 12 U/L — AB (ref 14–54)
AST: 19 U/L (ref 15–41)
Albumin: 3.5 g/dL (ref 3.5–5.0)
BILIRUBIN INDIRECT: 0.3 mg/dL (ref 0.3–0.9)
BILIRUBIN TOTAL: 0.4 mg/dL (ref 0.3–1.2)
Bilirubin, Direct: 0.1 mg/dL (ref 0.1–0.5)
TOTAL PROTEIN: 7.1 g/dL (ref 6.5–8.1)

## 2016-12-21 LAB — BASIC METABOLIC PANEL
ANION GAP: 11 (ref 5–15)
BUN: 11 mg/dL (ref 6–20)
CALCIUM: 9.4 mg/dL (ref 8.9–10.3)
CO2: 24 mmol/L (ref 22–32)
Chloride: 100 mmol/L — ABNORMAL LOW (ref 101–111)
Creatinine, Ser: 0.82 mg/dL (ref 0.44–1.00)
GLUCOSE: 165 mg/dL — AB (ref 65–99)
POTASSIUM: 4.3 mmol/L (ref 3.5–5.1)
SODIUM: 135 mmol/L (ref 135–145)

## 2016-12-21 LAB — PHOSPHORUS: Phosphorus: 3.9 mg/dL (ref 2.5–4.6)

## 2016-12-21 LAB — TSH: TSH: 1.366 u[IU]/mL (ref 0.350–4.500)

## 2016-12-21 LAB — CREATININE, SERUM
CREATININE: 0.81 mg/dL (ref 0.44–1.00)
GFR calc non Af Amer: >60 mL/min (ref >60)

## 2016-12-21 LAB — CBG MONITORING, ED: GLUCOSE-CAPILLARY: 131 mg/dL — AB (ref 65–99)

## 2016-12-21 LAB — GLUCOSE, CAPILLARY: GLUCOSE-CAPILLARY: 167 mg/dL — AB (ref 65–99)

## 2016-12-21 LAB — I-STAT TROPONIN, ED
TROPONIN I, POC: 0.05 ng/mL (ref 0.00–0.08)
Troponin i, poc: 0 ng/mL (ref 0.00–0.08)

## 2016-12-21 LAB — D-DIMER, QUANTITATIVE: D-Dimer, Quant: 0.43 ug/mL-FEU (ref 0.00–0.50)

## 2016-12-21 LAB — TROPONIN I: Troponin I: <0.03 ng/mL (ref <0.03)

## 2016-12-21 LAB — LIPASE, BLOOD: Lipase: 32 U/L (ref 11–51)

## 2016-12-21 LAB — BRAIN NATRIURETIC PEPTIDE: B Natriuretic Peptide: 18.2 pg/mL (ref 0.0–100.0)

## 2016-12-21 LAB — MAGNESIUM: Magnesium: 1.8 mg/dL (ref 1.7–2.4)

## 2016-12-21 LAB — PREGNANCY, URINE: Preg Test, Ur: NEGATIVE

## 2016-12-21 LAB — HCG, QUANTITATIVE, PREGNANCY: hCG, Beta Chain, Quant, S: <1 m[IU]/mL (ref <5)

## 2016-12-21 MED ORDER — GI COCKTAIL ~~LOC~~: 30.0000 mL | Freq: Four times a day (QID) | ORAL | Status: DC | PRN

## 2016-12-21 MED ORDER — FLUOXETINE HCL 10 MG PO CAPS
10.0000 mg | ORAL_CAPSULE | Freq: Every day | ORAL | Status: DC
  Administered 2016-12-22 – 2016-12-24 (×3): 10 mg via ORAL
  Filled 2016-12-21 (×3): qty 1

## 2016-12-21 MED ORDER — SODIUM CHLORIDE 0.9 % IV BOLUS (SEPSIS)
1000.0000 mL | Freq: Once | INTRAVENOUS | Status: AC
Start: 2016-12-21 — End: 2016-12-21
  Administered 2016-12-21: 1000 mL via INTRAVENOUS

## 2016-12-21 MED ORDER — PROMETHAZINE HCL 25 MG/ML IJ SOLN
12.5000 mg | Freq: Once | INTRAMUSCULAR | Status: AC
  Administered 2016-12-21: 12.5 mg via INTRAVENOUS
  Filled 2016-12-21: qty 1

## 2016-12-21 MED ORDER — ONDANSETRON HCL 4 MG/2ML IJ SOLN
4.0000 mg | Freq: Once | INTRAMUSCULAR | Status: AC
  Administered 2016-12-21: 4 mg via INTRAVENOUS
  Filled 2016-12-21: qty 2

## 2016-12-21 MED ORDER — ASPIRIN 325 MG PO TABS
325.0000 mg | ORAL_TABLET | Freq: Once | ORAL | Status: AC
  Administered 2016-12-21: 325 mg via ORAL
  Filled 2016-12-21: qty 1

## 2016-12-21 MED ORDER — INSULIN ASPART 100 UNIT/ML ~~LOC~~ SOLN
0.0000 [IU] | Freq: Three times a day (TID) | SUBCUTANEOUS | Status: DC
  Administered 2016-12-22: 2 [IU] via SUBCUTANEOUS
  Administered 2016-12-22: 1 [IU] via SUBCUTANEOUS
  Administered 2016-12-23: 2 [IU] via SUBCUTANEOUS
  Administered 2016-12-24: 1 [IU] via SUBCUTANEOUS

## 2016-12-21 MED ORDER — HYDROXYZINE PAMOATE 25 MG PO CAPS: 25.0000 mg | ORAL_CAPSULE | Freq: Every evening | ORAL | Status: DC | PRN

## 2016-12-21 MED ORDER — HYDROMORPHONE HCL 1 MG/ML IJ SOLN
1.0000 mg | Freq: Once | INTRAMUSCULAR | Status: AC
  Administered 2016-12-21: 1 mg via INTRAVENOUS
  Filled 2016-12-21: qty 1

## 2016-12-21 MED ORDER — GI COCKTAIL ~~LOC~~
30.0000 mL | Freq: Once | ORAL | Status: AC
  Administered 2016-12-21: 30 mL via ORAL
  Filled 2016-12-21: qty 30

## 2016-12-21 MED ORDER — LISINOPRIL 40 MG PO TABS
40.0000 mg | ORAL_TABLET | Freq: Every day | ORAL | Status: DC
  Administered 2016-12-22 – 2016-12-24 (×3): 40 mg via ORAL
  Filled 2016-12-21 (×3): qty 1

## 2016-12-21 MED ORDER — ASPIRIN EC 325 MG PO TBEC
325.0000 mg | DELAYED_RELEASE_TABLET | Freq: Every day | ORAL | Status: DC
  Administered 2016-12-22 – 2016-12-24 (×3): 325 mg via ORAL
  Filled 2016-12-21 (×3): qty 1

## 2016-12-21 MED ORDER — ONDANSETRON HCL 4 MG PO TABS: 4.0000 mg | ORAL_TABLET | Freq: Three times a day (TID) | ORAL | Status: DC | PRN

## 2016-12-21 MED ORDER — HEPARIN SODIUM (PORCINE) 5000 UNIT/ML IJ SOLN
5000.0000 [IU] | Freq: Three times a day (TID) | INTRAMUSCULAR | Status: DC
  Administered 2016-12-21 – 2016-12-23 (×7): 5000 [IU] via SUBCUTANEOUS
  Filled 2016-12-21 (×8): qty 1

## 2016-12-21 MED ORDER — SODIUM CHLORIDE 0.9 % IV SOLN
INTRAVENOUS | Status: DC
  Administered 2016-12-21: 22:00:00 via INTRAVENOUS

## 2016-12-21 MED ORDER — ONDANSETRON HCL 4 MG/2ML IJ SOLN
4.0000 mg | Freq: Four times a day (QID) | INTRAMUSCULAR | Status: DC | PRN
  Administered 2016-12-22: 4 mg via INTRAVENOUS
  Filled 2016-12-21: qty 2

## 2016-12-21 MED ORDER — ACETAMINOPHEN 325 MG PO TABS
650.0000 mg | ORAL_TABLET | ORAL | Status: DC | PRN
  Filled 2016-12-21: qty 2

## 2016-12-21 MED ORDER — KETOROLAC TROMETHAMINE 15 MG/ML IJ SOLN
15.0000 mg | Freq: Once | INTRAMUSCULAR | Status: AC
  Administered 2016-12-21: 15 mg via INTRAVENOUS
  Filled 2016-12-21: qty 1

## 2016-12-21 MED ORDER — NITROGLYCERIN 0.4 MG SL SUBL
0.4000 mg | SUBLINGUAL_TABLET | SUBLINGUAL | Status: DC | PRN
  Administered 2016-12-21 (×2): 0.4 mg via SUBLINGUAL
  Filled 2016-12-21 (×2): qty 1

## 2016-12-21 MED ORDER — MORPHINE SULFATE (PF) 2 MG/ML IV SOLN
2.0000 mg | INTRAVENOUS | Status: DC | PRN
  Administered 2016-12-21 – 2016-12-23 (×10): 2 mg via INTRAVENOUS
  Filled 2016-12-21 (×10): qty 1

## 2016-12-21 MED ORDER — ALPRAZOLAM 0.25 MG PO TABS
0.2500 mg | ORAL_TABLET | Freq: Two times a day (BID) | ORAL | Status: DC | PRN
  Administered 2016-12-22: 0.25 mg via ORAL
  Filled 2016-12-21: qty 1

## 2016-12-21 MED ORDER — ONDANSETRON HCL 4 MG/2ML IJ SOLN
4.0000 mg | Freq: Three times a day (TID) | INTRAMUSCULAR | Status: DC | PRN
  Administered 2016-12-21: 4 mg via INTRAVENOUS
  Filled 2016-12-21: qty 2

## 2016-12-21 MED ORDER — MORPHINE SULFATE (PF) 4 MG/ML IV SOLN: 4.0000 mg | Freq: Once | INTRAVENOUS | Status: DC

## 2016-12-21 NOTE — ED Notes (Signed)
Notified admitting MD, pt still having active CP.   Admitting MD placed admission orders for SD and Nitro (see mar)

## 2016-12-21 NOTE — ED Provider Notes (Signed)
Patient received as signout from Dr Myrene Buddy  Patient with atypical chest pain and RUQ pain. Initial workup is reassuring.    RUQ [place] is pending. Repeat troponin pending.  Patient reports feeling improved at time of my initial evaluation (1600) and she understands the plan of care.   US shows no acute process.   Delta trop shows slight increase and patient with continued discomfort. I plan to admit for RO ACS workup and continued observation.   Case discussed with admitting service and they will evaluate in the ED for admission.      Valarie Merino, MD 12/21/16 [phone]

## 2016-12-21 NOTE — ED Notes (Signed)
Got patient into a gown on the monitor patient is resting with call bell in reach 

## 2016-12-21 NOTE — ED Notes (Signed)
Patient asked for urine sample.  States that she fills like she is unable to give sample at this time, bladder scan done.

## 2016-12-21 NOTE — Progress Notes (Signed)
Patient complaining of nausea had 4mg  Zofran at 1943.  [facility] paged with this information.

## 2016-12-21 NOTE — Consult Note (Signed)
Cardiology Consult    Patient ID: Ruhee Enck MRN: [id number], DOB/AGE: 1976/01/23   Admit date: 12/21/2016 Date of Consult: 12/21/2016  Primary Physician: Aldona Bar, MD Primary Cardiologist: new Requesting Provider: Jenetta Downer. Alfredia Ferguson, MD  Patient Profile    Cassandra Greer is a 41 y.o. female with a history of chest pain, HTN, MJ abuse, obesity, and panic attacks, who is being seen today for the evaluation of a 5 day history of constant chest pain with RUQ and chest wall tenderness and normal troponins at the request of Dr. Alfredia Ferguson.  Past Medical History   Past Medical History:  Diagnosis Date  . Chest pain   . Colitis   . Hypertension   . Marijuana abuse   . Morbid obesity ([place])   . Panic attacks     Past Surgical History:  Procedure Laterality Date  . TUBAL LIGATION       Allergies  No Known Allergies  History of Present Illness    41 year old female with the above past medical history including hypertension, obesity, colitis, marijuana abuse, and panic attacks.  She reports that over the past 5 years, often in the setting of panic attacks, she develops retrosternal chest discomfort, sometimes associated with dyspnea, occurring about twice a month, lasting anywhere between 1 hour and several days, and resolving spontaneously.  She has never been evaluated by cardiology.  She has had several ER visits for abdominal pain, nausea, and vomiting the last of Which Taking Pl. in March 2018, at which time abdominal ultrasound showed hepatic steatosis but was otherwise normal.    She says that beginning approximately last Thursday, November Eighth, she had another episode of retrosternal chest discomfort and right upper quadrant discomfort.  The pain moves from her left shoulder, across her chest to the right side and right shoulder and down to her right upper quadrant.  Pain has been constant in nature and associated with diaphoresis, nausea, and chest wall and right  upper quadrant tenderness.  She has had intermittent shortness of breath but denies palpitations, dizziness, syncope, constipation, diarrhea, dark stools, or bright red blood per rectum.  No fevers or chills.  She initially thought this was just another episode of chest pain that has been occurring for several years but as symptoms persisted longer than usual, she presented to the [facility] emergency department today.  Here, ECG is notable for lateral Q waves, a left axis, and left atrial enlargement.  This is similar to prior ECGs.  Blood pressure was markedly elevated on arrival at 180/134 and this has since improved with pain management.  Despite prolonged symptoms, troponin is normal.  Chest x-ray nonacute.  Right upper quadrant ultrasound negative for gallstones or biliary dilatation.  With exception of leukocytosis and a white count of 14.2, lab work is otherwise normal.  Chest pain is reproducible with palpation.  She is currently lying in the stretcher and is diaphoretic while at rest.  She has been nauseated and has been treated for this.  Home Medications     Prior to Admission medications   Medication Sig Start Date End Date Taking? Authorizing Provider  FLUoxetine (PROZAC) 10 MG capsule Take 10 mg daily by mouth.   Yes [provider]  hydrOXYzine (VISTARIL) 25 MG capsule Take 25 mg at bedtime as needed by mouth for anxiety.   Yes [provider]  lisinopril (PRINIVIL,ZESTRIL) 20 MG tablet Take 20 mg daily by mouth.   Yes [provider]  gabapentin (NEURONTIN)  300 MG capsule Take 300 mg by mouth at bedtime.    [provider]      Family History    Family History  Problem Relation Age of Onset  . Hypertension Other   . Diabetes Mellitus II Other   . Bipolar disorder Mother   . Other Father        unaware of his PMH    Social History    Social History   Socioeconomic History  . Marital status: Married    Spouse name: Not on file  .  Number of children: Not on file  . Years of education: Not on file  . Highest education level: Not on file  Social Needs  . Financial resource strain: Not on file  . Food insecurity - worry: Not on file  . Food insecurity - inability: Not on file  . Transportation needs - medical: Not on file  . Transportation needs - non-medical: Not on file  Occupational History  . Not on file  Tobacco Use  . Smoking status: Never Smoker  . Smokeless tobacco: Never Used  Substance and Sexual Activity  . Alcohol use: No  . Drug use: Yes    Types: Marijuana    Comment: 2-3 x/month  . Sexual activity: Yes  Other Topics Concern  . Not on file  Social History Narrative   Lives in [place] with husband and 2 children.  Has older children that have moved out.  Does not routinely exercise.     Review of Systems    General:  +++ persistent diaphoresis.  No chills, fever, night sweats or weight changes.  Cardiovascular:  +++ chest pain and tenderness, +++ dyspnea, no edema, orthopnea, palpitations, paroxysmal nocturnal dyspnea. Dermatological: No rash, lesions/masses Respiratory: No cough, +++ dyspnea Urologic: No hematuria, dysuria Abdominal:   +++ nausea, no vomiting, diarrhea, bright red blood per rectum, melena, or hematemesis Neurologic:  No visual changes, wkns, changes in mental status. All other systems reviewed and are otherwise negative except as noted above.  Physical Exam    Blood pressure (!) 122/91, pulse (!) 52, temperature 99 F (37.2 C), temperature source Oral, resp. rate 16, last menstrual period 11/20/2016, SpO2 91 %.  General: Pleasant, NAD. Diaphoretic. Psych: flat affect. Neuro: Alert and oriented X 3. Moves all extremities spontaneously. HEENT: Normal  Neck: Supple without bruits or JVD. Lungs:  Resp regular and unlabored, CTA. Heart: RRR no s3, s4, or murmurs. Chest Wall: Tender to palpation from right shoulder across precordium/sternal areal. Abdomen: Soft, non-distended,  BS + x 4. RUQ tenderness. Extremities: No clubbing, cyanosis or edema. DP/PT/Radials 2+ and equal bilaterally.  Labs    Troponin [facility] of Care Test) Recent Labs    12/21/16 1618  TROPIPOC 0.05   Lab Results  Component Value Date   WBC 14.2 (H) 12/21/2016   HGB 12.7 12/21/2016   HCT 38.9 12/21/2016   MCV 86.8 12/21/2016   PLT 409 (H) 12/21/2016    Recent Labs  Lab 12/21/16 1305 12/21/16 1414  NA 135  --   K 4.3  --   CL 100*  --   CO2 24  --   BUN 11  --   CREATININE 0.82  --   CALCIUM 9.4  --   PROT  --  7.1  BILITOT  --  0.4  ALKPHOS  --  92  ALT  --  12*  AST  --  19  GLUCOSE 165*  --    Lab Results  Component Value Date   DDIMER 0.43 12/21/2016    Radiology Studies    Dg Chest 2 View  Result Date: 12/21/2016 CLINICAL DATA:  Chest pain. EXAM: CHEST  2 VIEW COMPARISON:  Chest x-ray dated November 04, 2015. FINDINGS: The cardiomediastinal silhouette is normal in size. Normal pulmonary vascularity. No focal consolidation, pleural effusion, or pneumothorax. No acute osseous abnormality. IMPRESSION: No active cardiopulmonary disease. Electronically Signed   By: Titus Dubin M.D.   On: 12/21/2016 13:23   US Abdomen Limited Ruq  Result Date: 12/21/2016 CLINICAL DATA:  Right upper quadrant pain for 2 days EXAM: ULTRASOUND ABDOMEN LIMITED RIGHT UPPER QUADRANT COMPARISON:  04/24/2016 FINDINGS: Gallbladder: No gallstones or wall thickening visualized. No sonographic Murphy sign noted by sonographer. Common bile duct: Diameter: 2.9 mm Liver: Diffuse increased hepatic echogenicity. Portal vein is patent on color Doppler imaging with normal direction of blood flow towards the liver. IMPRESSION: 1. Negative for gallstones or biliary dilatation 2. Diffuse increased hepatic echogenicity consistent with fatty infiltration. Electronically Signed   By: Donavan Foil M.D.   On: 12/21/2016 17:02    ECG & Cardiac Imaging    Regular sinus rhythm, 95, left axis deviation, left  anterior fascicular block, left atrial enlargement, question old lateral infarct, nonspecific ST-T changes-no acute ST or T changes.  Assessment & Plan    1.  Atypical chest pain/right upper quadrant abdominal pain: Patient reports a 5-year history of intermittent chest and abdominal pain occurring about twice a month, lasting between 1 hour and several days, and resolving spontaneously.  She says that these typically occur in the setting of panic attacks.  She had a similar episode beginning November 8 and this has been constant in nature ever since.  This is been associated with diaphoresis and mild dyspnea.  She presented to the emergency department today secondary to persistent symptoms and has been found to have 2 normal troponins, an unchanged EKG, normal chest x-ray, and no evidence for gallstones or biliary dilatation on abdominal ultrasound.  D-dimer is normal.  White blood cell count is mildly elevated at 14.2.  With prolonged symptoms and an absence of objective evidence of ischemia, I would not pursue any additional ischemic evaluation at this time.  Further workup for abdominal pain, diaphoresis, and leukocytosis per internal medicine.  2.  Essential hypertension: Blood pressure initially 180/134 on arrival.  This has improved with pain management-Dilaudid/Toradol.  She takes lisinopril at home. Recommend titrating to 40 mg daily.  She will likely require an additional agent - chlorthalidone.   3.  Marijuana abuse: Cessation advised.  4.  Morbid obesity: Would benefit from outpatient nutritional counseling.  Signed, Murray Hodgkins, NP 12/21/2016, 7:06 PM  For questions or updates, please contact   Please consult [email or web address] for contact info under Cardiology/STEMI.  The patient was seen and examined, and I agree with the history, physical exam, assessment and plan as documented above, with modifications as noted below. I have also personally reviewed all relevant documentation,  old records, labs, and radiographic studies. I have also independently interpreted old and new ECG's.  41 yr old woman with aforementioned history and presentation whom we are asked to consult for chest pain and abnormal ECG. She has had 5 days of bilateral upper chest pain with radiation down right arm and concomitant right upper quadrant pain. Symptoms became more pronounced today and thus came to the ED. She initially felt symptoms were similar to prior panic attacks. She has associated nausea, vomiting, and diaphoresis. Denies  any associated RUQ pain with eating.  Thus far, troponins, d-dimer, and BNP are all normal. I compared today's ECG with one from 04/2016 and there are no major changes.  RUQ ultrasound demonstrates fatty infiltration but no gallstones or biliary dilatation.  Chest xray is normal.  WBC and platelets are mildly elevated. She is afebrile. No gross metabolic abnormalities. LFT's and lipase are unremarkable.  Utox is pending.  She was severely hypertensive upon arrival and this has significantly improved with pain management (Dilaudid).  Assessment and Plan: At this time, I do not feel she warrants any further ischemic evaluation as I have a low suspicion for ischemic heart disease. With chest pain ongoing for 5 days, I would have expected some degree of troponin elevation by now.  She will need additional antihypertensive management and chlorthalidone (based on ALLHAT trial data) is indicated.  No consistent history to suggest biliary colic. Given unremarkable LFT's, acalculous cholecystitis also lower on differential diagnosis. Await results of additional labs and studies ordered by internal medicine.  Kate Sable, MD, [facility]  12/21/2016 7:51 PM

## 2016-12-21 NOTE — ED Notes (Signed)
Pt aware we need urine sample.  

## 2016-12-21 NOTE — H&P (Signed)
History and Physical    Cassandra Greer UVO:[id number] DOB: Mar 11, 1975 DOA: 12/21/2016  PCP: Aldona Bar, MD   Patient coming from: Home  Chief Complaint: Chest Pain and Pressure  HPI: Cassandra Greer is a 41 y.o. female with medical history significant of Anxiety, Panic Disorder, HTN, Hx of Colitis, and Hx of Headaches and other comorbids who presented to the ED with a CC of Chest Pain/Pressure that started approximately two days ago. Over the last few months she has been having intermittent Chest Pain that she has attributed to Anxiety Attacks. She developed sudden onset of Chest Pain 2 days ago that has been intermittent but pretty consistent and complained of 10/10 in Severity. Associated symptoms of Diaphoresis, Radiation across chest into the Right Arm, and Nausea. Patient also complained of Epigastric pain that was worsen with palpation. Patient states nothing mad the chest pain better so she came to the ED for evaluation. BP was high in the ED so she was given Analgesics with Dilaudid, Ketorolac, and GI Cocktail with minimal relief. Patient continued to have CP so TRH was called to Admit for CP r/o. Cardiology was consulted as patient remained diaphoretic and nauseous.   ED Course: Had blood work done, given 3 mg of IV dilaudid, IV Ketorolac, GI Cocktail. CXR was Negative. EKG was done along with an U/S of the Abdomen.   Review of Systems: As per HPI otherwise 10 point review of systems negative.   Past Medical History:  Diagnosis Date  . Chest pain   . Colitis   . Hypertension   . Marijuana abuse   . Morbid obesity ([place])   . Panic attacks    Past Surgical History:  Procedure Laterality Date  . TUBAL LIGATION     SOCIAL HISTORY  reports that  has never smoked. she has never used smokeless tobacco. She reports that she uses drugs. Drug: Marijuana. She reports that she does not drink alcohol.  No Known Allergies  Family History  Problem Relation Age of Onset  .  Hypertension Other   . Diabetes Mellitus II Other   . Bipolar disorder Mother   . Other Father        unaware of his PMH   Prior to Admission medications   Medication Sig Start Date End Date Taking? Authorizing Provider  FLUoxetine (PROZAC) 10 MG capsule Take 10 mg daily by mouth.   Yes [provider]  hydrOXYzine (VISTARIL) 25 MG capsule Take 25 mg at bedtime as needed by mouth for anxiety.   Yes [provider]  lisinopril (PRINIVIL,ZESTRIL) 20 MG tablet Take 20 mg daily by mouth.   Yes [provider]  gabapentin (NEURONTIN) 300 MG capsule Take 300 mg by mouth at bedtime.    [provider]   Physical Exam: Vitals:   12/21/16 1822 12/21/16 1830 12/21/16 1845 12/21/16 1900  BP:  (!) 122/91 114/78 137/78  Pulse: (!) 59 (!) 52 (!) 52 (!) 55  Resp:      Temp:      TempSrc:      SpO2: 95% 91% 92% 91%   Constitutional: WN/WD obese Caucasian female who appears uncomfortable and is diaphoretic and nauseous Eyes: Lids and conjunctivae normal, sclerae anicteric  ENMT: External Ears, Nose appear normal. Grossly normal hearing. Mucous membranes are moist. Has some hirsutism. Neck: Appears normal, supple, no cervical masses, normal ROM, no appreciable thyromegaly, no JVD Respiratory: Diminished to auscultation bilaterally, no wheezing, rales, rhonchi or crackles. Normal respiratory effort and  patient is not tachypenic. No accessory muscle use.  Cardiovascular: RRR, no murmurs / rubs / gallops. S1 and S2 auscultated. No extremity edema.  Abdomen: Soft, Tender to Palpate mid epigastric area, non-distended. No masses palpated. No appreciable hepatosplenomegaly. Bowel sounds positive x4.  GU: Deferred. Musculoskeletal: No clubbing / cyanosis of digits/nails. No joint deformity upper and lower extremities. Good ROM, no contractures. Skin: No rashes, lesions, ulcers on a limited skin eval. Has some tattoos. No induration; Warm and dry.  Neurologic: CN 2-12  grossly intact with no focal deficits. Romberg sign and cerebellar reflexes not assessed.  Psychiatric: Normal judgment and insight. Alert and oriented x 3. Normal mood and appropriate affect.   Labs on Admission: I have personally reviewed following labs and imaging studies  CBC: Recent Labs  Lab 12/21/16 1305  WBC 14.2*  HGB 12.7  HCT 38.9  MCV 86.8  PLT 425*   Basic Metabolic Panel: Recent Labs  Lab 12/21/16 1305 12/21/16 1603  NA 135  --   K 4.3  --   CL 100*  --   CO2 24  --   GLUCOSE 165*  --   BUN 11  --   CREATININE 0.82  --   CALCIUM 9.4  --   MG  --  1.8  PHOS  --  3.9   GFR: CrCl cannot be calculated (Unknown ideal weight.). Liver Function Tests: Recent Labs  Lab 12/21/16 1414  AST 19  ALT 12*  ALKPHOS 92  BILITOT 0.4  PROT 7.1  ALBUMIN 3.5   Recent Labs  Lab 12/21/16 1414  LIPASE 32   No results for input(s): AMMONIA in the last 168 hours. Coagulation Profile: No results for input(s): INR, PROTIME in the last 168 hours. Cardiac Enzymes: No results for input(s): CKTOTAL, CKMB, CKMBINDEX, TROPONINI in the last 168 hours. BNP (last 3 results) No results for input(s): PROBNP in the last 8760 hours. HbA1C: No results for input(s): HGBA1C in the last 72 hours. CBG: Recent Labs  Lab 12/21/16 1810  GLUCAP 131*   Lipid Profile: No results for input(s): CHOL, HDL, LDLCALC, TRIG, CHOLHDL, LDLDIRECT in the last 72 hours. Thyroid Function Tests: No results for input(s): TSH, T4TOTAL, FREET4, T3FREE, THYROIDAB in the last 72 hours. Anemia Panel: No results for input(s): VITAMINB12, FOLATE, FERRITIN, TIBC, IRON, RETICCTPCT in the last 72 hours. Urine analysis:    Component Value Date/Time   COLORURINE YELLOW 04/24/2016 1533   APPEARANCEUR HAZY (A) 04/24/2016 1533   LABSPEC 1.027 04/24/2016 1533   PHURINE 6.0 04/24/2016 1533   GLUCOSEU NEGATIVE 04/24/2016 1533   HGBUR LARGE (A) 04/24/2016 1533   BILIRUBINUR NEGATIVE 04/24/2016 1533    KETONESUR 5 (A) 04/24/2016 1533   PROTEINUR 100 (A) 04/24/2016 1533   UROBILINOGEN 1.0 01/09/2014 1806   NITRITE NEGATIVE 04/24/2016 1533   LEUKOCYTESUR MODERATE (A) 04/24/2016 1533   Sepsis Labs: !!!!!!!!!!!!!!!!!!!!!!!!!!!!!!!!!!!!!!!!!!!! @LABRCNTIP (procalcitonin:4,lacticidven:4) )No results found for this or any previous visit (from the past 240 hour(s)).   Radiological Exams on Admission: Dg Chest 2 View  Result Date: 12/21/2016 CLINICAL DATA:  Chest pain. EXAM: CHEST  2 VIEW COMPARISON:  Chest x-ray dated November 04, 2015. FINDINGS: The cardiomediastinal silhouette is normal in size. Normal pulmonary vascularity. No focal consolidation, pleural effusion, or pneumothorax. No acute osseous abnormality. IMPRESSION: No active cardiopulmonary disease. Electronically Signed   By: Titus Dubin M.D.   On: 12/21/2016 13:23   US Abdomen Limited Ruq  Result Date: 12/21/2016 CLINICAL DATA:  Right upper quadrant pain for  2 days EXAM: ULTRASOUND ABDOMEN LIMITED RIGHT UPPER QUADRANT COMPARISON:  04/24/2016 FINDINGS: Gallbladder: No gallstones or wall thickening visualized. No sonographic Murphy sign noted by sonographer. Common bile duct: Diameter: 2.9 mm Liver: Diffuse increased hepatic echogenicity. Portal vein is patent on color Doppler imaging with normal direction of blood flow towards the liver. IMPRESSION: 1. Negative for gallstones or biliary dilatation 2. Diffuse increased hepatic echogenicity consistent with fatty infiltration. Electronically Signed   By: Donavan Foil M.D.   On: 12/21/2016 17:02   EKG: Independently reviewed. Showed NSR at a rate of 95 and had some LVH. Had some q waves in the inferior leads and some Twave flattening on my interpretation.   Assessment/Plan Active Problems:   Leukocytosis   HTN (hypertension)   Chest pain   Accelerated hypertension   Hyperglycemia   Epigastric abdominal pain   Fatty liver disease, nonalcoholic   Thrombocytosis (HCC)   Panic  disorder (episodic paroxysmal anxiety)  Atypical Chest Pain/Pressure r/o ACS  -Place in Obs SDU as continuing to have Chest Pain refractory to Analgesia and Telemetry does not take patients with Active Chest Pain  -Patient diaphoretic, nauseous; However was patient stated that it was somewhat reproducible -Differential Diagnosis could be MSK vs. Anxiety/Panic Attack vs. Chest Pressure from Elevated BP vs. Other Etiology -CP r/o Order Set Utilized -Cycle Troponin I's; POC Troponin went from 0.00 -> 0.05 -Repeat EKG -Check ECHOCARDIOGRAM -Check TSH, Ma, Phos, Lipid Panel, HbA1c -Pain Control with Morphine and Antiemetics with Zofran -C/w ASA 325 mg po daily and C/w Home Lisinopril 40 mg po Daily; Hold BB until UDS results   -D-Dimer was 0.43 -BNP was 18.2 -Add NTG 0.4 mg sL q31minprn CP -Check UDS -CXR showed no acute cardiopulmonary disease -Given 1 Liter bolus; Will continue maintenance Fluid at 75 mL/hr -Repeat EKG now -Patient continuing to have Active CP so will discuss with Cardiology; Potential to go on Nitro gtt if not improved  -Discussed with Dr. Bronson Ing in Cardiology and they will see the patient   Accelerated Hypertension, improved -Improved with Pain Control; Given IV Dilaudid 1 mg x 3 and IV Ketorolac -C/w Home Lisinopril of 40 mg (documented as 20 mg po Daily on MAR) -Add Beta Blocker with Metoprolol 25 mg po BID if UDS is Negative  -BP has improved but continues to have active Chest Pain   Hyperglycemia -Check HbA1c -Place on Sensitive Novolog SSI AC -Continue to Monitor CBG's   Mid Epigastric Abdominal Pain -Tender to Palpate -Lipase Level Normal -GI Cocktail Given  -U/S was negative for Gallstones or biliary dilation; There was diffuse increased hepatic echogenicity consistent with fatty infiltration  -If Pregancy Test is negative may consider getting a CT Abdomen and Pelvis   Fatty Liver Disease -Seen on U/S -LFTs Normal -Diet and Weight Loss Counseling  and Education given  Leukocytosis -Likely Reactive 2/2 to Pain -CXR was Clear -No Symptoms of Dysuria or Cough -Had a mild temperature of 99 -Continue to Monitor for S/Sx of Infection and Repeat CBC in AM  Thrombocytosis -Mild at 409 -Continue to Monitor and repeat CBC in AM   Hx of Anxiety and Panic Disorder -C/w Home Fluoxetine and Atarax -Added Alprazolam 0.25 mg po BID  DVT prophylaxis: Enoxaparin 40 mg sq q24h Code Status: FULL CODE Family Communication: No family present at bedside Disposition Plan: Anticipate D/C back to Home Consults called: Cardiology Dr. Bronson Ing Admission status: Obs SDU  Severity of Illness: The appropriate patient status for this patient is OBSERVATION. Observation  status is judged to be reasonable and necessary in order to provide the required intensity of service to ensure the patient's safety. The patient's presenting symptoms, physical exam findings, and initial radiographic and laboratory data in the context of their medical condition is felt to place them at decreased risk for further clinical deterioration. Furthermore, it is anticipated that the patient will be medically stable for discharge from the hospital within 2 midnights of admission. The following factors support the patient status of observation.   " The patient's presenting symptoms include Chest Pain and Nausea. " The physical exam findings include Abdominal pain and slightly reproducible Chest Pain. " The initial radiographic and laboratory data are elevated WBC and Troponin Increasing.  Kerney Elbe, D.O. [facility] Pager [phone]  If 7PM-7AM, please contact night-coverage [email or web address] Password [email or web address]  12/21/2016, 7:09 PM

## 2016-12-21 NOTE — ED Notes (Signed)
Heart healthy ordered 

## 2016-12-21 NOTE — ED Notes (Signed)
Spoke with main lab, they will attempt to add on quantitative pregnancy to previous lab draw.

## 2016-12-21 NOTE — ED Triage Notes (Signed)
Pt to ER for evaluation of central chest pressure radiating to right arm and shortness of breath x months, states has had issues with blood pressure recently and was sent here by PCP for cardiac workup. Pt dyspneic at rest in triage but a/o x4. Hypertensive in triage at 180/134

## 2016-12-21 NOTE — ED Provider Notes (Signed)
[facility] EMERGENCY DEPARTMENT Provider Note   CSN: [id number] Arrival date & time: 12/21/16  1242     History   Chief Complaint Chief Complaint  Patient presents with  . Hypertension    HPI Cassandra Greer is a 41 y.o. female.  HPI   41 year old female with past medical history of hypertension, who presents with elevated blood pressure and chest pain.  The patient reports that over the last several months, she has had progressively worsening hypertension.  She is on lisinopril for this.  Over the last several days, she has noticed persistent, bilateral chest pain with shortness of breath.  She has associated radiation to her right shoulder blade with right upper quadrant abdominal pain.  She has had nausea with it as well but no vomiting.  The pain does not significantly worsen with position changes.  She states that she went to her doctor today, who sent her to the ED for evaluation.  Her pain is constant, aching, pressure-like, bilateral but worse on the right, and radiates to her shoulder as mentioned.  She denies any tearing component.  Denies any fevers or chills.  Denies any diarrhea. No other symptoms. No h/o CAD. No family h/o early CAD, but does have h/o heart disease in family.  Past Medical History:  Diagnosis Date  . Colitis   . Hypertension     Patient Active Problem List   Diagnosis Date Noted  . HTN (hypertension) 03/08/2013  . Hypokalemia 03/08/2013  . Headache(784.0) 03/08/2013  . Colitis 03/05/2013  . Leucocytosis 03/05/2013  . Sinusitis 03/05/2013    Past Surgical History:  Procedure Laterality Date  . TUBAL LIGATION      OB History    No data available       Home Medications    Prior to Admission medications   Medication Sig Start Date End Date Taking? Authorizing Provider  FLUoxetine (PROZAC) 10 MG capsule Take 10 mg daily by mouth.   Yes [provider]  hydrOXYzine (VISTARIL) 25 MG capsule Take 25 mg at  bedtime as needed by mouth for anxiety.   Yes [provider]  lisinopril (PRINIVIL,ZESTRIL) 20 MG tablet Take 20 mg daily by mouth.   Yes [provider]  ciprofloxacin (CIPRO) 500 MG tablet Take 1 tablet (500 mg total) by mouth 2 (two) times daily. Patient not taking: Reported on 11/03/2015 03/10/13   Cristal Ford, DO  dextromethorphan-guaiFENesin [facility] DM) 30-600 MG per 12 hr tablet Take 1 tablet by mouth 2 (two) times daily. Patient not taking: Reported on 11/03/2015 01/09/14   Waynetta Pean, PA-C  gabapentin (NEURONTIN) 100 MG capsule Take 100 mg by mouth 3 (three) times daily.    [provider]  gabapentin (NEURONTIN) 300 MG capsule Take 300 mg by mouth at bedtime.    [provider]  HYDROcodone-acetaminophen (NORCO/VICODIN) 5-325 MG per tablet Take 1 tablet by mouth every 4 (four) hours as needed for moderate pain or severe pain. Patient not taking: Reported on 11/03/2015 01/09/14   Waynetta Pean, PA-C  lisinopril (PRINIVIL,ZESTRIL) 5 MG tablet Take 1 tablet (5 mg total) by mouth daily. Patient not taking: Reported on 12/21/2016 03/10/13   Cristal Ford, DO  metroNIDAZOLE (FLAGYL) 500 MG tablet Take 1 tablet (500 mg total) by mouth 3 (three) times daily. Patient not taking: Reported on 11/03/2015 03/10/13   Cristal Ford, DO  ondansetron (ZOFRAN ODT) 4 MG disintegrating tablet 4mg  ODT q4 hours prn nausea/vomit Patient not taking: Reported on 12/21/2016  04/24/16   Deno Etienne, DO  promethazine (PHENERGAN) 25 MG tablet Take 1 tablet (25 mg total) by mouth every 6 (six) hours as needed for nausea or vomiting. Patient not taking: Reported on 12/21/2016 01/09/14   Waynetta Pean, PA-C    Family History Family History  Problem Relation Age of Onset  . Hypertension Other   . Diabetes Mellitus II Other     Social History Social History   Tobacco Use  . Smoking status: Never Smoker  . Smokeless tobacco: Never Used  Substance Use Topics  .  Alcohol use: No  . Drug use: Yes    Types: Marijuana     Allergies   Patient has no known allergies.   Review of Systems Review of Systems  Respiratory: Positive for cough and shortness of breath.   Cardiovascular: Positive for chest pain.  Gastrointestinal: Positive for abdominal pain and nausea.  Skin: Negative for wound.  Neurological: Positive for weakness.  All other systems reviewed and are negative.    Physical Exam Updated Vital Signs BP (!) 138/96   Pulse 82   Temp 99 F (37.2 C) (Oral)   Resp 13   LMP 11/20/2016   SpO2 94%   Physical Exam  Constitutional: She is oriented to person, place, and time. She appears well-developed and well-nourished. She appears distressed.  HENT:  Head: Normocephalic and atraumatic.  Eyes: Conjunctivae are normal.  Neck: Neck supple.  Cardiovascular: Normal rate, regular rhythm and normal heart sounds. Exam reveals no friction rub.  No murmur heard. Pulmonary/Chest: Effort normal and breath sounds normal. Tachypnea noted. No respiratory distress. She has no decreased breath sounds. She has no wheezes. She has no rales.  Abdominal: Soft. Normal appearance. She exhibits no distension. There is tenderness in the right upper quadrant. There is guarding. There is no rigidity and no rebound.  Musculoskeletal: She exhibits no edema.  Neurological: She is alert and oriented to person, place, and time. She exhibits normal muscle tone.  Skin: Skin is warm. Capillary refill takes less than 2 seconds.  Psychiatric: She has a normal mood and affect.  Nursing note and vitals reviewed.    ED Treatments / Results  Labs (all labs ordered are listed, but only abnormal results are displayed) Labs Reviewed  BASIC METABOLIC PANEL - Abnormal; Notable for the following components:      Result Value   Chloride 100 (*)    Glucose, Bld 165 (*)    All other components within normal limits  CBC - Abnormal; Notable for the following components:    WBC 14.2 (*)    Platelets 409 (*)    All other components within normal limits  HEPATIC FUNCTION PANEL - Abnormal; Notable for the following components:   ALT 12 (*)    All other components within normal limits  LIPASE, BLOOD  D-DIMER, QUANTITATIVE (NOT AT [facility])  BRAIN NATRIURETIC PEPTIDE  I-STAT TROPONIN, ED  I-STAT TROPONIN, ED    EKG  EKG Interpretation  Date/Time:  Monday December 21 2016 13:00:09 EST Ventricular Rate:  95 PR Interval:  182 QRS Duration: 92 QT Interval:  356 QTC Calculation: 447 R Axis:   -4 Text Interpretation:  Normal sinus rhythm Possible Left atrial enlargement Left ventricular hypertrophy Possible Lateral infarct , age undetermined Cannot rule out Inferior infarct , age undetermined Abnormal ECG No significant change since last tracing Confirmed by Duffy Bruce [phone]) on 12/21/2016 3:24:06 PM       Radiology Dg Chest 2 View  Result  Date: 12/21/2016 CLINICAL DATA:  Chest pain. EXAM: CHEST  2 VIEW COMPARISON:  Chest x-ray dated November 04, 2015. FINDINGS: The cardiomediastinal silhouette is normal in size. Normal pulmonary vascularity. No focal consolidation, pleural effusion, or pneumothorax. No acute osseous abnormality. IMPRESSION: No active cardiopulmonary disease. Electronically Signed   By: Titus Dubin M.D.   On: 12/21/2016 13:23    Procedures Procedures (including critical care time)  Medications Ordered in ED Medications  HYDROmorphone (DILAUDID) injection 1 mg (not administered)  gi cocktail (Maalox,Lidocaine,Donnatal) (not administered)  ketorolac (TORADOL) 15 MG/ML injection 15 mg (not administered)  sodium chloride 0.9 % bolus 1,000 mL (1,000 mLs Intravenous New Bag/Given 12/21/16 1417)  ondansetron (ZOFRAN) injection 4 mg (4 mg Intravenous Given 12/21/16 1417)  HYDROmorphone (DILAUDID) injection 1 mg (1 mg Intravenous Given 12/21/16 1417)     Initial Impression / Assessment and Plan / ED Course  I have reviewed the  triage vital signs and the nursing notes.  Pertinent labs & imaging results that were available during my care of the patient were reviewed by me and considered in my medical decision making (see chart for details).     40 year old female here with bilateral chest pain, worse on the right, with shortness of breath and nausea.  On exam, she does have significant right upper quadrant tenderness and there does seem to be a component of nausea and radiation to her shoulder blade. DDx is broad. Must consider ACS/angina, though EKG non-ischemic and trop negative. HEART score is 3. Otherwise, D-Dimer neg and I do not suspect PE or dissection. Given her RUQ TTP, there's a change that some of this could be GB disease as well. Must also consider HTN urgency given her marked HTN on arrival here.  Will plan to eval her CP with delta trop given HEART score 3, while treating with analgesia. Will also obtain RUQ U/S for eval of possible biliary colic component.  Plan to re-assess after additional analgesia, u/s, and delta troponin. If normal U/S, negative troponin and pain is improved/resolved with improvement in BP, feel that pt can be managed as outpt based on her HEART score with adjustment of BP meds, outpatient echo with her PCP. If her pain persists or if labs/imaging abnormal, dispo accordingly. Pt updated and in agreement with this plan.   Final Clinical Impressions(s) / ED Diagnoses   Final diagnoses:  RUQ pain  Essential hypertension    ED Discharge Orders    None       Duffy Bruce, MD 12/21/16 1552

## 2016-12-22 ENCOUNTER — Observation Stay (HOSPITAL_COMMUNITY): Payer: Medicaid Other

## 2016-12-22 ENCOUNTER — Encounter (HOSPITAL_COMMUNITY): Payer: Self-pay | Admitting: Internal Medicine

## 2016-12-22 ENCOUNTER — Observation Stay (HOSPITAL_BASED_OUTPATIENT_CLINIC_OR_DEPARTMENT_OTHER): Payer: Medicaid Other

## 2016-12-22 ENCOUNTER — Other Ambulatory Visit: Payer: Self-pay

## 2016-12-22 DIAGNOSIS — E785 Hyperlipidemia, unspecified: Secondary | ICD-10-CM | POA: Diagnosis present

## 2016-12-22 DIAGNOSIS — M4802 Spinal stenosis, cervical region: Secondary | ICD-10-CM | POA: Diagnosis present

## 2016-12-22 DIAGNOSIS — R0789 Other chest pain: Secondary | ICD-10-CM | POA: Diagnosis present

## 2016-12-22 DIAGNOSIS — R7989 Other specified abnormal findings of blood chemistry: Secondary | ICD-10-CM | POA: Diagnosis present

## 2016-12-22 DIAGNOSIS — E1165 Type 2 diabetes mellitus with hyperglycemia: Secondary | ICD-10-CM | POA: Diagnosis present

## 2016-12-22 DIAGNOSIS — R1011 Right upper quadrant pain: Secondary | ICD-10-CM | POA: Diagnosis not present

## 2016-12-22 DIAGNOSIS — F410 Panic disorder [episodic paroxysmal anxiety] without agoraphobia: Secondary | ICD-10-CM | POA: Diagnosis present

## 2016-12-22 DIAGNOSIS — F1210 Cannabis abuse, uncomplicated: Secondary | ICD-10-CM | POA: Diagnosis present

## 2016-12-22 DIAGNOSIS — I160 Hypertensive urgency: Secondary | ICD-10-CM | POA: Diagnosis present

## 2016-12-22 DIAGNOSIS — Z6839 Body mass index (BMI) 39.0-39.9, adult: Secondary | ICD-10-CM | POA: Diagnosis not present

## 2016-12-22 DIAGNOSIS — E1169 Type 2 diabetes mellitus with other specified complication: Secondary | ICD-10-CM

## 2016-12-22 DIAGNOSIS — K760 Fatty (change of) liver, not elsewhere classified: Secondary | ICD-10-CM | POA: Diagnosis present

## 2016-12-22 DIAGNOSIS — R079 Chest pain, unspecified: Secondary | ICD-10-CM

## 2016-12-22 DIAGNOSIS — R064 Hyperventilation: Secondary | ICD-10-CM | POA: Diagnosis not present

## 2016-12-22 DIAGNOSIS — E782 Mixed hyperlipidemia: Secondary | ICD-10-CM

## 2016-12-22 DIAGNOSIS — D72829 Elevated white blood cell count, unspecified: Secondary | ICD-10-CM | POA: Diagnosis present

## 2016-12-22 DIAGNOSIS — Z79899 Other long term (current) drug therapy: Secondary | ICD-10-CM | POA: Diagnosis not present

## 2016-12-22 DIAGNOSIS — Z23 Encounter for immunization: Secondary | ICD-10-CM | POA: Diagnosis not present

## 2016-12-22 HISTORY — DX: Type 2 diabetes mellitus with other specified complication: E11.69

## 2016-12-22 LAB — CBC WITH DIFFERENTIAL/PLATELET
BASOS PCT: 0 %
Basophils Absolute: 0 10*3/uL (ref 0.0–0.1)
Eosinophils Absolute: 0.2 10*3/uL (ref 0.0–0.7)
Eosinophils Relative: 1 %
HEMATOCRIT: 35.8 % — AB (ref 36.0–46.0)
Hemoglobin: 11.4 g/dL — ABNORMAL LOW (ref 12.0–15.0)
LYMPHS PCT: 17 %
Lymphs Abs: 2.5 10*3/uL (ref 0.7–4.0)
MCH: 28 pg (ref 26.0–34.0)
MCHC: 31.8 g/dL (ref 30.0–36.0)
MCV: 88 fL (ref 78.0–100.0)
MONO ABS: 0.7 10*3/uL (ref 0.1–1.0)
MONOS PCT: 5 %
Neutro Abs: 10.9 10*3/uL — ABNORMAL HIGH (ref 1.7–7.7)
Neutrophils Relative %: 77 %
Platelets: 400 10*3/uL (ref 150–400)
RBC: 4.07 MIL/uL (ref 3.87–5.11)
RDW: 14.5 % (ref 11.5–15.5)
WBC: 14.3 10*3/uL — ABNORMAL HIGH (ref 4.0–10.5)

## 2016-12-22 LAB — GLUCOSE, CAPILLARY
GLUCOSE-CAPILLARY: 92 mg/dL (ref 65–99)
Glucose-Capillary: 159 mg/dL — ABNORMAL HIGH (ref 65–99)
Glucose-Capillary: 123 mg/dL — ABNORMAL HIGH (ref 65–99)
Glucose-Capillary: 127 mg/dL — ABNORMAL HIGH (ref 65–99)

## 2016-12-22 LAB — LIPID PANEL
CHOLESTEROL: 255 mg/dL — AB (ref 0–200)
HDL: 32 mg/dL — ABNORMAL LOW (ref >40)
LDL Cholesterol: 178 mg/dL — ABNORMAL HIGH (ref 0–99)
TRIGLYCERIDES: 224 mg/dL — AB (ref <150)
Total CHOL/HDL Ratio: 8 RATIO
VLDL: 45 mg/dL — ABNORMAL HIGH (ref 0–40)

## 2016-12-22 LAB — COMPREHENSIVE METABOLIC PANEL
ALT: 11 U/L — ABNORMAL LOW (ref 14–54)
ANION GAP: 9 (ref 5–15)
AST: 14 U/L — ABNORMAL LOW (ref 15–41)
Albumin: 3.4 g/dL — ABNORMAL LOW (ref 3.5–5.0)
Alkaline Phosphatase: 84 U/L (ref 38–126)
BILIRUBIN TOTAL: 0.4 mg/dL (ref 0.3–1.2)
BUN: 13 mg/dL (ref 6–20)
CO2: 24 mmol/L (ref 22–32)
Calcium: 8.6 mg/dL — ABNORMAL LOW (ref 8.9–10.3)
Chloride: 103 mmol/L (ref 101–111)
Creatinine, Ser: 0.73 mg/dL (ref 0.44–1.00)
Glucose, Bld: 100 mg/dL — ABNORMAL HIGH (ref 65–99)
POTASSIUM: 4.1 mmol/L (ref 3.5–5.1)
Sodium: 136 mmol/L (ref 135–145)
TOTAL PROTEIN: 6.6 g/dL (ref 6.5–8.1)

## 2016-12-22 LAB — HIV ANTIBODY (ROUTINE TESTING W REFLEX): HIV Screen 4th Generation wRfx: NONREACTIVE

## 2016-12-22 LAB — MAGNESIUM: MAGNESIUM: 1.8 mg/dL (ref 1.7–2.4)

## 2016-12-22 LAB — HEMOGLOBIN A1C
Hgb A1c MFr Bld: 6.6 % — ABNORMAL HIGH (ref 4.8–5.6)
Mean Plasma Glucose: 142.72 mg/dL

## 2016-12-22 LAB — ECHOCARDIOGRAM COMPLETE
Height: 67 in
Weight: 3980.8 oz

## 2016-12-22 LAB — PHOSPHORUS: Phosphorus: 3.8 mg/dL (ref 2.5–4.6)

## 2016-12-22 LAB — TROPONIN I

## 2016-12-22 LAB — MRSA PCR SCREENING: MRSA by PCR: NEGATIVE

## 2016-12-22 MED ORDER — ATORVASTATIN CALCIUM 40 MG PO TABS
40.0000 mg | ORAL_TABLET | Freq: Every day | ORAL | Status: DC
  Administered 2016-12-22 – 2016-12-24 (×3): 40 mg via ORAL
  Filled 2016-12-22 (×3): qty 1

## 2016-12-22 MED ORDER — KETOROLAC TROMETHAMINE 15 MG/ML IJ SOLN
15.0000 mg | Freq: Three times a day (TID) | INTRAMUSCULAR | Status: DC | PRN
  Administered 2016-12-22 – 2016-12-24 (×4): 15 mg via INTRAVENOUS
  Filled 2016-12-22 (×4): qty 1

## 2016-12-22 MED ORDER — INFLUENZA VAC SPLIT QUAD 0.5 ML IM SUSY
0.5000 mL | PREFILLED_SYRINGE | INTRAMUSCULAR | Status: AC
  Administered 2016-12-23: 0.5 mL via INTRAMUSCULAR
  Filled 2016-12-22: qty 0.5

## 2016-12-22 MED ORDER — PERFLUTREN LIPID MICROSPHERE
1.0000 mL | INTRAVENOUS | Status: AC | PRN
  Administered 2016-12-22: 2 mL via INTRAVENOUS

## 2016-12-22 MED ORDER — CHLORTHALIDONE 25 MG PO TABS
25.0000 mg | ORAL_TABLET | Freq: Every day | ORAL | Status: DC
  Administered 2016-12-22: 25 mg via ORAL
  Filled 2016-12-22 (×2): qty 1

## 2016-12-22 MED ORDER — PROMETHAZINE HCL 25 MG/ML IJ SOLN
12.5000 mg | Freq: Four times a day (QID) | INTRAMUSCULAR | Status: DC | PRN
  Administered 2016-12-22 – 2016-12-23 (×3): 12.5 mg via INTRAVENOUS
  Filled 2016-12-22 (×3): qty 1

## 2016-12-22 MED ORDER — IOPAMIDOL (ISOVUE-300) INJECTION 61%
INTRAVENOUS | Status: AC
  Administered 2016-12-22: 100 mL
  Filled 2016-12-22: qty 100

## 2016-12-22 MED ORDER — MORPHINE SULFATE (PF) 2 MG/ML IV SOLN
1.0000 mg | Freq: Once | INTRAVENOUS | Status: AC
  Administered 2016-12-22: 1 mg via INTRAVENOUS
  Filled 2016-12-22: qty 1

## 2016-12-22 MED ORDER — IOPAMIDOL (ISOVUE-300) INJECTION 61%
INTRAVENOUS | Status: AC
  Filled 2016-12-22: qty 30

## 2016-12-22 NOTE — Progress Notes (Signed)
  Echocardiogram 2D Echocardiogram has been performed.  Matilde Bash 12/22/2016, 9:16 AM

## 2016-12-22 NOTE — Progress Notes (Signed)
PROGRESS NOTE    Cassandra Greer  BMW:[id number] DOB: 06-18-1975 DOA: 12/21/2016 PCP: Aldona Bar, MD   Brief Narrative:  Cassandra Greer is a 41 y.o. female with medical history significant of Anxiety, Panic Disorder, HTN, Hx of Colitis, and Hx of Headaches and other comorbids who presented to the ED with a CC of Chest Pain/Pressure that started approximately two days ago. Over the last few months she has been having intermittent Chest Pain that she has attributed to Anxiety Attacks. She developed sudden onset of Chest Pain 2 days ago that has been intermittent but pretty consistent and complained of 10/10 in Severity. Associated symptoms of Diaphoresis, Radiation across chest into the Right Arm, and Nausea. Patient also complained of Epigastric pain that was worsen with palpation. Patient states nothing mad the chest pain better so she came to the ED for evaluation. BP was high in the ED so she was given Analgesics with Dilaudid, Ketorolac, and GI Cocktail with minimal relief. Patient continued to have CP so TRH was called to Admit for CP r/o. Cardiology was consulted as patient remained diaphoretic and nauseous. Cardiology evaluated and recommended ECHO and if normal no further workup. ECHO was normal. Patient however continued to have persistent Nausea, Chest, and Abdominal Pain so a CT Chest/Abd/Pelvis was ordered and was Negative. Added Promethazine for Nausea and if patient improved can likely D/C in AM.  Assessment & Plan:   Active Problems:   Leukocytosis   HTN (hypertension)   Chest pain   Accelerated hypertension   Hyperglycemia   Epigastric abdominal pain   Fatty liver disease, nonalcoholic   Thrombocytosis (HCC)   Panic disorder (episodic paroxysmal anxiety)   Type 2 diabetes mellitus with hyperlipidemia (HCC)   Hyperlipidemia  Atypical Chest Pain/Pressure r/o'd ACS  -Place in Obs SDU as continuing to have Chest Pain refractory to Analgesia and Telemetry does not  take patients with Active Chest Pain  -Patient diaphoretic, nauseous; However was patient stated that it was somewhat reproducible -Differential Diagnosis could be MSK vs. Anxiety/Panic Attack vs. Chest Pressure from Elevated BP vs. Other Etiology -CP r/o Order Set Utilized -Cycle Troponin I's; POC Troponin went from 0.00 -> 0.05; Troponin I <0.03 x3 -Repeat EKG -Checked ECHOCARDIOGRAM  -Lipid and A1c as below -TSH was 1.366 and Mag and Phos Normal -Pain Control with Morphine and Antiemetics with Zofran -Added Promethazine for Nausea -C/w ASA 325 mg po daily and C/w Home Lisinopril 40 mg po Daily; Hold BB until UDS results   -D-Dimer was 0.43 -BNP was 18.2 -Add NTG 0.4 mg sL q1minprn CP -Checked UDS and was Positive for Benzo's, Opiates, and THC -CXR showed no acute cardiopulmonary disease -Will continue maintenance Fluid at 75 mL/hr -Cardiology consulted and recommended ECHO and if Normal no further work up  -ECHOCardiogram was normal and showed The estimated ejection fraction was in the range of 55% to 60%. Wall motion was normal; there were no regional wall motion abnormalities. Left ventricular diastolic function parameters were normal. -Will try NSAIDs and IV Ketorolac 15 mg q8h  Accelerated Hypertension,  -Improved with Pain Control; Given IV Dilaudid 1 mg x 3 and IV Ketorolac -C/w Home Lisinopril of 40 mg (documented as 20 mg po Daily on MAR) -Added Chlorthalidone 25 mg po Daily  -IVF now stopped   Hyperglycemia and now New Diagnosis of Diabetes Mellitus Type 2 -HbA1c 6.6  -Placed on Sensitive Novolog SSI AC -Continue to Monitor CBG's  -Consult Diabetes Education Coordinator  -Will likley need Metformin  at D/C or will defer to PCP to Start  Mid Epigastric Abdominal Pain and Nausea -Tender to Palpate; ? Irritation from Liver Capsule from Hepatic Steatosis -Lipase Level Normal -GI Cocktail Given  -U/S was negative for Gallstones or biliary dilation; There was diffuse  increased hepatic echogenicity consistent with fatty infiltration  -CT Abd/Pelvis done and showed No acute or inflammatory process identified in the chest or abdomen and no acute or inflammatory process identificed in the pelvis; Trace pelvic free fluid and a small Left ovarian cyst are most likely physiologic -C/w Antiemetics with Zofran and added IV Promethazine -Gentle IVF Rehydration now D/C'd  Fatty Liver Disease/Hepatic Steatosis -Seen on U/S -CT Scan of Abd/Pelvish showed Hepatic Steatosis -LFTs Normal -Diet and Weight Loss Counseling and Education given  Leukocytosis, persistent -WBC went from 14.2 -> 15.2 -> 14.3  -Likely Reactive 2/2 to Pain -CXR was Clear -No Symptoms of Dysuria or Cough -Had a mild temperature of 99 yesterday -CT Chest/Abd/Pelvis Negative -If still elevated in AM may need Blood Cx  -Continue to Monitor for S/Sx of Infection and Repeat CBC in AM  Thrombocytosis, improved -Mild at 409 -> 402 -> 400 -Continue to Monitor and repeat CBC in AM   Hx of Anxiety and Panic Disorder -C/w Home Fluoxetine and Atarax -Added Alprazolam 0.25 mg po BID  Hyperlipidemia -Lipid Panel Showed Cholesterol of 255, HDL of 32, LDL of 178, TG of 224, and VLDL of 45 -Start Atorvastatin 40 mg po qHS -Diet Counseling given  DVT prophylaxis: Heparin 5,000 units sq q8h Code Status: FULL CODE Family Communication: No family present at bedside Disposition Plan: Anticipate D/C Home in AM if improved with Nausea  Consultants:   Cardiology Dr. Percival Spanish   Procedures:  ECHOCARDIOGRAM ------------------------------------------------------------------- Study Conclusions  - Left ventricle: The cavity size was normal. There was mild   concentric hypertrophy. Systolic function was normal. The   estimated ejection fraction was in the range of 55% to 60%. Wall   motion was normal; there were no regional wall motion   abnormalities. Left ventricular diastolic function  parameters   were normal. - Aortic valve: Transvalvular velocity was within the normal range.   There was no stenosis. There was no regurgitation. - Mitral valve: Transvalvular velocity was within the normal range.   There was no evidence for stenosis. There was mild regurgitation. - Right ventricle: The cavity size was normal. Wall thickness was   normal. - Tricuspid valve: There was trivial regurgitation. - Pulmonary arteries: Systolic pressure was within the normal   range. PA peak pressure: 26 mm Hg (S). - Pericardium, extracardiac: A trivial pericardial effusion was   identified.    Antimicrobials:  Anti-infectives (From admission, onward)   None     Subjective: Seen and examined and still complaining Chest Pain, Nausea, and Abdominal Pain   Objective: Vitals:   12/22/16 1419 12/22/16 1624 12/22/16 1700 12/22/16 1802  BP: (!) 160/109 (!) 175/109 (!) 162/105 (!) 162/111  Pulse:   78   Resp:   14   Temp:  97.8 F (36.6 C)    TempSrc:  Oral    SpO2:  96% 97%   Weight:      Height:        Intake/Output Summary (Last 24 hours) at 12/22/2016 1803 Last data filed at 12/22/2016 1400 Gross per 24 hour  Intake 1420 ml  Output -  Net 1420 ml   Filed Weights   12/21/16 2116 12/22/16 0341  Weight: 113.4 kg (250 lb)  112.9 kg (248 lb 12.8 oz)   Examination: Physical Exam:  Constitutional: WN/WD obese Caucasian female who appears nauseous and uncomfortable Eyes: Lids and conjunctivae normal, sclerae anicteric  ENMT: External Ears, Nose appear normal. Grossly normal hearing. Mucous membranes are moist. Has some hirsuitism Neck: Appears normal, supple, no cervical masses, normal ROM, no appreciable thyromegaly, no JVD Respiratory: Diminished to auscultation bilaterally, no wheezing, rales, rhonchi or crackles. Normal respiratory effort and patient is not tachypenic. No accessory muscle use.  Cardiovascular: RRR, no murmurs / rubs / gallops. S1 and S2 auscultated. No  extremity edema.  Abdomen: Soft, tender to palpate mid abdomen, non-distended. No masses palpated. Bowel sounds positive x4.  GU: Deferred. Musculoskeletal: No clubbing / cyanosis of digits/nails. No joint deformity upper and lower extremities. Good ROM, no contractures.  Skin: No rashes, lesions, ulcers on a limited skin evaluation. No induration; Warm and dry.  Neurologic: CN 2-12 grossly intact with no focal deficits. Romberg sign and cerebellar reflexes not assessed.  Psychiatric: Normal judgment and insight. Alert and oriented x 3. Normal mood and a little flat affect.   Data Reviewed: I have personally reviewed following labs and imaging studies  CBC: Recent Labs  Lab 12/21/16 1305 12/21/16 2118 12/22/16 0256  WBC 14.2* 15.2* 14.3*  NEUTROABS  --   --  10.9*  HGB 12.7 12.5 11.4*  HCT 38.9 38.9 35.8*  MCV 86.8 87.2 88.0  PLT 409* 402* 166   Basic Metabolic Panel: Recent Labs  Lab 12/21/16 1305 12/21/16 1603 12/21/16 2118 12/22/16 0256  NA 135  --   --  136  K 4.3  --   --  4.1  CL 100*  --   --  103  CO2 24  --   --  24  GLUCOSE 165*  --   --  100*  BUN 11  --   --  13  CREATININE 0.82  --  0.81 0.73  CALCIUM 9.4  --   --  8.6*  MG  --  1.8  --  1.8  PHOS  --  3.9  --  3.8   GFR: Estimated Creatinine Clearance: 121.2 mL/min (by C-G formula based on SCr of 0.73 mg/dL). Liver Function Tests: Recent Labs  Lab 12/21/16 1414 12/22/16 0256  AST 19 14*  ALT 12* 11*  ALKPHOS 92 84  BILITOT 0.4 0.4  PROT 7.1 6.6  ALBUMIN 3.5 3.4*   Recent Labs  Lab 12/21/16 1414  LIPASE 32   No results for input(s): AMMONIA in the last 168 hours. Coagulation Profile: No results for input(s): INR, PROTIME in the last 168 hours. Cardiac Enzymes: Recent Labs  Lab 12/21/16 2118 12/22/16 0003 12/22/16 0256  TROPONINI <0.03 <0.03 <0.03   BNP (last 3 results) No results for input(s): PROBNP in the last 8760 hours. HbA1C: Recent Labs    12/22/16 0256  HGBA1C 6.6*    CBG: Recent Labs  Lab 12/21/16 1810 12/21/16 2123 12/22/16 0729 12/22/16 1112 12/22/16 1623  GLUCAP 131* 167* 92 159* 123*   Lipid Profile: Recent Labs    12/22/16 0256  CHOL 255*  HDL 32*  LDLCALC 178*  TRIG 224*  CHOLHDL 8.0   Thyroid Function Tests: Recent Labs    12/21/16 2118  TSH 1.366   Anemia Panel: No results for input(s): VITAMINB12, FOLATE, FERRITIN, TIBC, IRON, RETICCTPCT in the last 72 hours. Sepsis Labs: No results for input(s): PROCALCITON, LATICACIDVEN in the last 168 hours.  Recent Results (from the past 240 hour(s))  MRSA PCR Screening     Status: None   Collection Time: 12/21/16  9:17 PM  Result Value Ref Range Status   MRSA by PCR NEGATIVE NEGATIVE Final    Comment:        The GeneXpert MRSA Assay (FDA approved for NASAL specimens only), is one component of a comprehensive MRSA colonization surveillance program. It is not intended to diagnose MRSA infection nor to guide or monitor treatment for MRSA infections.     Radiology Studies: Dg Chest 2 View  Result Date: 12/21/2016 CLINICAL DATA:  Chest pain. EXAM: CHEST  2 VIEW COMPARISON:  Chest x-ray dated November 04, 2015. FINDINGS: The cardiomediastinal silhouette is normal in size. Normal pulmonary vascularity. No focal consolidation, pleural effusion, or pneumothorax. No acute osseous abnormality. IMPRESSION: No active cardiopulmonary disease. Electronically Signed   By: Titus Dubin M.D.   On: 12/21/2016 13:23   Ct Chest W Contrast  Result Date: 12/22/2016 CLINICAL DATA:  41 year old female with chest pain, shortness of breath, pleurisy, effusion, abdominal pain, nausea, vomiting. EXAM: CT CHEST, ABDOMEN, AND PELVIS WITH CONTRAST TECHNIQUE: Multidetector CT imaging of the chest, abdomen and pelvis was performed following the standard protocol during bolus administration of intravenous contrast. CONTRAST:  165mL ISOVUE-300 IOPAMIDOL (ISOVUE-300) INJECTION 61% COMPARISON:  None. CT  Abdomen and Pelvis 04/24/2016. Chest radiographs 12/21/2016 and earlier. FINDINGS: CT CHEST FINDINGS Cardiovascular: Suggestion of some left ventricular hypertrophy. Otherwise no cardiomegaly. No pericardial effusion. Negative thoracic aorta and proximal great vessels. No definite calcified coronary artery atherosclerosis. Mediastinum/Nodes: Negative.  No lymphadenopathy. Lungs/Pleura: Major airways are patent. Both lungs are clear. No pleural effusion or pleural thickening. Borderline to mild pulmonary hyperinflation. Musculoskeletal: Interbody ankylosis of T9-T10 anteriorly. Otherwise thoracic osseous structures appear normal for age. No acute osseous abnormality identified. CT ABDOMEN PELVIS FINDINGS Hepatobiliary: Evidence of hepatic steatosis again noted. Contracted gallbladder with no surrounding inflammation. Pancreas: Negative. Spleen: Negative. Adrenals/Urinary Tract: Normal adrenal glands. Bilateral renal enhancement and contrast excretion appears symmetric and normal. No perinephric stranding. No hydroureter. Stable small left hemipelvis phlebolith. Unremarkable urinary bladder. Stomach/Bowel: Negative rectum. Redundant but otherwise negative sigmoid colon. Negative left colon. Negative transverse colon aside from mild retained stool. Oral contrast has reached the hepatic flexure. Negative right colon. Normal retrocecal appendix. Negative terminal ileum. No dilated or abnormal small bowel. Negative stomach and duodenum. No abdominal free fluid. Vascular/Lymphatic: Major arterial structures are patent. Minimal iliac calcified atherosclerosis. Portal venous system appears patent. No lymphadenopathy. Reproductive: Mild left ovarian enlargement appears related to a 2.8 cm simple fluid density cyst which is likely physiologic. Negative uterus and right ovary. Other: Trace pelvic free fluid in the cul-de-sac. Probable small ventral abdominal injection site on the right series 3, image 91. Musculoskeletal:  Normal for age lumbar spine. Stable mild sclerosis along the SI joints greater on the right. No acute osseous abnormality identified. IMPRESSION: 1. No acute or inflammatory process identified in the chest or abdomen. 2. No acute or inflammatory process identified in the pelvis; trace pelvic free fluid and a small left ovarian cyst are most likely physiologic. 3. Hepatic steatosis. Electronically Signed   By: Genevie Ann M.D.   On: 12/22/2016 16:47   Ct Abdomen Pelvis W Contrast  Result Date: 12/22/2016 CLINICAL DATA:  41 year old female with chest pain, shortness of breath, pleurisy, effusion, abdominal pain, nausea, vomiting. EXAM: CT CHEST, ABDOMEN, AND PELVIS WITH CONTRAST TECHNIQUE: Multidetector CT imaging of the chest, abdomen and pelvis was performed following the standard protocol during bolus administration of intravenous  contrast. CONTRAST:  177mL ISOVUE-300 IOPAMIDOL (ISOVUE-300) INJECTION 61% COMPARISON:  None. CT Abdomen and Pelvis 04/24/2016. Chest radiographs 12/21/2016 and earlier. FINDINGS: CT CHEST FINDINGS Cardiovascular: Suggestion of some left ventricular hypertrophy. Otherwise no cardiomegaly. No pericardial effusion. Negative thoracic aorta and proximal great vessels. No definite calcified coronary artery atherosclerosis. Mediastinum/Nodes: Negative.  No lymphadenopathy. Lungs/Pleura: Major airways are patent. Both lungs are clear. No pleural effusion or pleural thickening. Borderline to mild pulmonary hyperinflation. Musculoskeletal: Interbody ankylosis of T9-T10 anteriorly. Otherwise thoracic osseous structures appear normal for age. No acute osseous abnormality identified. CT ABDOMEN PELVIS FINDINGS Hepatobiliary: Evidence of hepatic steatosis again noted. Contracted gallbladder with no surrounding inflammation. Pancreas: Negative. Spleen: Negative. Adrenals/Urinary Tract: Normal adrenal glands. Bilateral renal enhancement and contrast excretion appears symmetric and normal. No  perinephric stranding. No hydroureter. Stable small left hemipelvis phlebolith. Unremarkable urinary bladder. Stomach/Bowel: Negative rectum. Redundant but otherwise negative sigmoid colon. Negative left colon. Negative transverse colon aside from mild retained stool. Oral contrast has reached the hepatic flexure. Negative right colon. Normal retrocecal appendix. Negative terminal ileum. No dilated or abnormal small bowel. Negative stomach and duodenum. No abdominal free fluid. Vascular/Lymphatic: Major arterial structures are patent. Minimal iliac calcified atherosclerosis. Portal venous system appears patent. No lymphadenopathy. Reproductive: Mild left ovarian enlargement appears related to a 2.8 cm simple fluid density cyst which is likely physiologic. Negative uterus and right ovary. Other: Trace pelvic free fluid in the cul-de-sac. Probable small ventral abdominal injection site on the right series 3, image 91. Musculoskeletal: Normal for age lumbar spine. Stable mild sclerosis along the SI joints greater on the right. No acute osseous abnormality identified. IMPRESSION: 1. No acute or inflammatory process identified in the chest or abdomen. 2. No acute or inflammatory process identified in the pelvis; trace pelvic free fluid and a small left ovarian cyst are most likely physiologic. 3. Hepatic steatosis. Electronically Signed   By: Genevie Ann M.D.   On: 12/22/2016 16:47   US Abdomen Limited Ruq  Result Date: 12/21/2016 CLINICAL DATA:  Right upper quadrant pain for 2 days EXAM: ULTRASOUND ABDOMEN LIMITED RIGHT UPPER QUADRANT COMPARISON:  04/24/2016 FINDINGS: Gallbladder: No gallstones or wall thickening visualized. No sonographic Murphy sign noted by sonographer. Common bile duct: Diameter: 2.9 mm Liver: Diffuse increased hepatic echogenicity. Portal vein is patent on color Doppler imaging with normal direction of blood flow towards the liver. IMPRESSION: 1. Negative for gallstones or biliary dilatation 2.  Diffuse increased hepatic echogenicity consistent with fatty infiltration. Electronically Signed   By: Donavan Foil M.D.   On: 12/21/2016 17:02   Scheduled Meds: . aspirin EC  325 mg Oral Daily  . atorvastatin  40 mg Oral q1800  . chlorthalidone  25 mg Oral Daily  . FLUoxetine  10 mg Oral Daily  . heparin  5,000 Units Subcutaneous Q8H  . [START ON 12/23/2016] Influenza vac split quadrivalent PF  0.5 mL Intramuscular Tomorrow-1000  . insulin aspart  0-9 Units Subcutaneous TID WC  . iopamidol      . lisinopril  40 mg Oral Daily   Continuous Infusions:   LOS: 0 days   Kerney Elbe, DO [facility] Pager [phone]  If 7PM-7AM, please contact night-coverage [email or web address] Password [facility] 12/22/2016, 6:03 PM

## 2016-12-22 NOTE — Progress Notes (Signed)
Patient continues to complain of 7/10 across the chest pressure with tingling down right arm. 2mg  of morphine IV push administered at 2358 with no relief.  [facility] p[facility] with this information.

## 2016-12-22 NOTE — Progress Notes (Signed)
Progress Note  Patient Name: Elly Modena Date of Encounter: 12/22/2016  Primary Cardiologist: new - Dr. Bronson Ing or Kaetlyn Noa  Subjective   Pt resting comfortably in bed. Still complains of bilateral chest pain refractory to morphine and nitro, and right arm tingling.  Inpatient Medications    Scheduled Meds: . aspirin EC  325 mg Oral Daily  . FLUoxetine  10 mg Oral Daily  . heparin  5,000 Units Subcutaneous Q8H  . [START ON 12/23/2016] Influenza vac split quadrivalent PF  0.5 mL Intramuscular Tomorrow-1000  . insulin aspart  0-9 Units Subcutaneous TID WC  . lisinopril  40 mg Oral Daily   Continuous Infusions: . sodium chloride 75 mL/hr at 12/21/16 2140   PRN Meds: acetaminophen, ALPRAZolam, gi cocktail, morphine injection, nitroGLYCERIN, ondansetron (ZOFRAN) IV, ondansetron   Vital Signs    Vitals:   12/22/16 0030 12/22/16 0341 12/22/16 0343 12/22/16 0730  BP: (!) 142/102  (!) 152/90 (!) 155/99  Pulse: 73  69   Resp: 12  19   Temp:   (!) 97.5 F (36.4 C) 98 F (36.7 C)  TempSrc:   Oral Oral  SpO2: 95%  96% 96%  Weight:  248 lb 12.8 oz (112.9 kg)    Height:        Intake/Output Summary (Last 24 hours) at 12/22/2016 0803 Last data filed at 12/22/2016 0700 Gross per 24 hour  Intake 1700 ml  Output -  Net 1700 ml   Filed Weights   12/21/16 2116 12/22/16 0341  Weight: 250 lb (113.4 kg) 248 lb 12.8 oz (112.9 kg)     Physical Exam   General: Well developed, well nourished, female appearing in no acute distress. Head: Normocephalic, atraumatic.  Neck: Supple without bruits, no JVD. Lungs:  Resp regular and unlabored, CTA. Heart: RRR, S1, S2, no S3, S4, or murmur; no rub. Abdomen: Soft, non-tender, non-distended with normoactive bowel sounds. No hepatomegaly. No rebound/guarding. No obvious abdominal masses. Extremities: No clubbing, cyanosis, no edema. Distal pedal pulses are 2+ bilaterally. Neuro: Alert and oriented X 3. Moves all extremities  spontaneously. Psych: Normal affect.  Labs    Chemistry Recent Labs  Lab 12/21/16 1305 12/21/16 1414 12/21/16 2118 12/22/16 0256  NA 135  --   --  136  K 4.3  --   --  4.1  CL 100*  --   --  103  CO2 24  --   --  24  GLUCOSE 165*  --   --  100*  BUN 11  --   --  13  CREATININE 0.82  --  0.81 0.73  CALCIUM 9.4  --   --  8.6*  PROT  --  7.1  --  6.6  ALBUMIN  --  3.5  --  3.4*  AST  --  19  --  14*  ALT  --  12*  --  11*  ALKPHOS  --  92  --  84  BILITOT  --  0.4  --  0.4  GFRNONAA >60  --  >60 >60  GFRAA >60  --  >60 >60  ANIONGAP 11  --   --  9     Hematology Recent Labs  Lab 12/21/16 1305 12/21/16 2118 12/22/16 0256  WBC 14.2* 15.2* 14.3*  RBC 4.48 4.46 4.07  HGB 12.7 12.5 11.4*  HCT 38.9 38.9 35.8*  MCV 86.8 87.2 88.0  MCH 28.3 28.0 28.0  MCHC 32.6 32.1 31.8  RDW 14.2 14.1 14.5  PLT 409* 402* 400    Cardiac Enzymes Recent Labs  Lab 12/21/16 2118 12/22/16 0003 12/22/16 0256  TROPONINI <0.03 <0.03 <0.03    Recent Labs  Lab 12/21/16 1317 12/21/16 1618  TROPIPOC 0.00 0.05     BNP Recent Labs  Lab 12/21/16 1556  BNP 18.2     DDimer  Recent Labs  Lab 12/21/16 1414  DDIMER 0.43     Radiology    Dg Chest 2 View  Result Date: 12/21/2016 CLINICAL DATA:  Chest pain. EXAM: CHEST  2 VIEW COMPARISON:  Chest x-ray dated November 04, 2015. FINDINGS: The cardiomediastinal silhouette is normal in size. Normal pulmonary vascularity. No focal consolidation, pleural effusion, or pneumothorax. No acute osseous abnormality. IMPRESSION: No active cardiopulmonary disease. Electronically Signed   By: Titus Dubin M.D.   On: 12/21/2016 13:23   US Abdomen Limited Ruq  Result Date: 12/21/2016 CLINICAL DATA:  Right upper quadrant pain for 2 days EXAM: ULTRASOUND ABDOMEN LIMITED RIGHT UPPER QUADRANT COMPARISON:  04/24/2016 FINDINGS: Gallbladder: No gallstones or wall thickening visualized. No sonographic Murphy sign noted by sonographer. Common bile duct:  Diameter: 2.9 mm Liver: Diffuse increased hepatic echogenicity. Portal vein is patent on color Doppler imaging with normal direction of blood flow towards the liver. IMPRESSION: 1. Negative for gallstones or biliary dilatation 2. Diffuse increased hepatic echogenicity consistent with fatty infiltration. Electronically Signed   By: Donavan Foil M.D.   On: 12/21/2016 17:02     Telemetry    Normal sinus rhythm - Personally Reviewed  ECG    No new tracings - Personally Reviewed   Cardiac Studies   Echocardiogram pending  Patient Profile     41 y.o. female with a history of chest pain, HTN, MJ abuse, obesity, and panic attacks, who is being seen for the evaluation of a 5 day history of constant chest pain with RUQ and chest wall tenderness and normal troponins.  Assessment & Plan    1. Chest pain - troponin x 5 negative - no significant changes in EKG  Pt continues to have chest pain that is refractory to nitro and morphine. Will await echocardiogram results. If echo is normal, will not pursue further ischemic evaluation.    2. Right arm tingling - consider neurology consult for near-constant right arm tingling   3. Hypertension - pressures not controlled overnight - lisinopril increased to 40 mg - follow pressure today, may require the addition of chlorthalidone    Signed, Ledora Bottcher , PA-C 8:03 AM 12/22/2016 Pager: [phone]   History and all data above reviewed.  Patient examined.  I agree with the findings as above. Still with chest pain.  No objective evidence of ischemia.   The patient exam reveals COR:RRR, no rub  ,  Lungs: Clear  ,  Abd: Positive bowel sounds, no rebound no guarding, Ext No edema  .  All available labs, radiology testing, previous records reviewed. Agree with documented assessment and plan. Chest pain:  Check echo.  If NL no further work up.  HTN:  BP still elevated with lisinopril increased.  Further management per IM.   Lauriel Helin   12:00 PM  12/22/2016

## 2016-12-23 ENCOUNTER — Inpatient Hospital Stay (HOSPITAL_COMMUNITY): Payer: Medicaid Other

## 2016-12-23 LAB — COMPREHENSIVE METABOLIC PANEL
ALT: 13 U/L — AB (ref 14–54)
AST: 16 U/L (ref 15–41)
Albumin: 3.7 g/dL (ref 3.5–5.0)
Alkaline Phosphatase: 92 U/L (ref 38–126)
Anion gap: 8 (ref 5–15)
BUN: 12 mg/dL (ref 6–20)
CHLORIDE: 98 mmol/L — AB (ref 101–111)
CO2: 29 mmol/L (ref 22–32)
Calcium: 9 mg/dL (ref 8.9–10.3)
Creatinine, Ser: 0.86 mg/dL (ref 0.44–1.00)
GFR calc non Af Amer: >60 mL/min (ref >60)
Glucose, Bld: 127 mg/dL — ABNORMAL HIGH (ref 65–99)
Potassium: 4.5 mmol/L (ref 3.5–5.1)
SODIUM: 135 mmol/L (ref 135–145)
Total Bilirubin: 0.7 mg/dL (ref 0.3–1.2)
Total Protein: 7.4 g/dL (ref 6.5–8.1)

## 2016-12-23 LAB — CBC WITH DIFFERENTIAL/PLATELET
BASOS PCT: 0 %
Basophils Absolute: 0 10*3/uL (ref 0.0–0.1)
EOS ABS: 0.3 10*3/uL (ref 0.0–0.7)
EOS PCT: 3 %
HCT: 38.7 % (ref 36.0–46.0)
Hemoglobin: 12.5 g/dL (ref 12.0–15.0)
Lymphocytes Relative: 26 %
Lymphs Abs: 3 10*3/uL (ref 0.7–4.0)
MCH: 28.3 pg (ref 26.0–34.0)
MCHC: 32.3 g/dL (ref 30.0–36.0)
MCV: 87.6 fL (ref 78.0–100.0)
Monocytes Absolute: 0.7 10*3/uL (ref 0.1–1.0)
Monocytes Relative: 6 %
Neutro Abs: 7.6 10*3/uL (ref 1.7–7.7)
Neutrophils Relative %: 65 %
PLATELETS: 403 10*3/uL — AB (ref 150–400)
RBC: 4.42 MIL/uL (ref 3.87–5.11)
RDW: 14.4 % (ref 11.5–15.5)
WBC: 11.6 10*3/uL — ABNORMAL HIGH (ref 4.0–10.5)

## 2016-12-23 LAB — PHOSPHORUS: PHOSPHORUS: 3.8 mg/dL (ref 2.5–4.6)

## 2016-12-23 LAB — GLUCOSE, CAPILLARY
GLUCOSE-CAPILLARY: 175 mg/dL — AB (ref 65–99)
GLUCOSE-CAPILLARY: 117 mg/dL — AB (ref 65–99)
GLUCOSE-CAPILLARY: 115 mg/dL — AB (ref 65–99)
Glucose-Capillary: 108 mg/dL — ABNORMAL HIGH (ref 65–99)

## 2016-12-23 LAB — MAGNESIUM: Magnesium: 1.9 mg/dL (ref 1.7–2.4)

## 2016-12-23 MED ORDER — LORAZEPAM 1 MG PO TABS
1.0000 mg | ORAL_TABLET | Freq: Two times a day (BID) | ORAL | Status: DC
  Administered 2016-12-23 – 2016-12-24 (×3): 1 mg via ORAL
  Filled 2016-12-23 (×3): qty 1

## 2016-12-23 MED ORDER — LORAZEPAM 1 MG PO TABS: 1.0000 mg | ORAL_TABLET | Freq: Two times a day (BID) | ORAL | Status: DC

## 2016-12-23 MED ORDER — CHLORTHALIDONE 25 MG PO TABS
25.0000 mg | ORAL_TABLET | Freq: Every day | ORAL | Status: DC
  Filled 2016-12-23: qty 1

## 2016-12-23 MED ORDER — LIVING WELL WITH DIABETES BOOK
Freq: Once | Status: AC
  Administered 2016-12-23: 11:00:00
  Filled 2016-12-23: qty 1

## 2016-12-23 MED ORDER — PROMETHAZINE HCL 25 MG PO TABS
25.0000 mg | ORAL_TABLET | Freq: Four times a day (QID) | ORAL | Status: DC | PRN
  Administered 2016-12-23 – 2016-12-24 (×3): 25 mg via ORAL
  Filled 2016-12-23 (×3): qty 1

## 2016-12-23 MED ORDER — MAGNESIUM HYDROXIDE 400 MG/5ML PO SUSP
15.0000 mL | Freq: Once | ORAL | Status: AC
  Administered 2016-12-23: 15 mL via ORAL
  Filled 2016-12-23: qty 30

## 2016-12-23 MED ORDER — GI COCKTAIL ~~LOC~~
30.0000 mL | Freq: Once | ORAL | Status: AC
  Administered 2016-12-23: 30 mL via ORAL
  Filled 2016-12-23: qty 30

## 2016-12-23 MED ORDER — CHLORTHALIDONE 50 MG PO TABS
50.0000 mg | ORAL_TABLET | Freq: Every day | ORAL | Status: DC
  Administered 2016-12-23 – 2016-12-24 (×2): 50 mg via ORAL
  Filled 2016-12-23 (×2): qty 1

## 2016-12-23 MED ORDER — CHLORTHALIDONE 50 MG PO TABS
50.0000 mg | ORAL_TABLET | Freq: Every day | ORAL | Status: DC
Start: 2016-12-23 — End: 2016-12-23
  Filled 2016-12-23: qty 1

## 2016-12-23 NOTE — Progress Notes (Signed)
Inpatient Diabetes Program Recommendations  AACE/ADA: New Consensus Statement on Inpatient Glycemic Control (2015)  Target Ranges:  Prepandial:   less than 140 mg/dL      Peak postprandial:   less than 180 mg/dL (1-2 hours)      Critically ill patients:  140 - 180 mg/dL   Lab Results  Component Value Date   GLUCAP 108 (H) 12/23/2016   HGBA1C 6.6 (H) 12/22/2016    Review of Glycemic Control  Diabetes history: No prior hx DM Current orders for Inpatient glycemic control: Novolog correction sensitive tid  Inpatient Diabetes Program Recommendations:  Received consult regarding new onset DM. RNs to provide ongoing basic DM education at bedside with this patient. Have ordered educational booklet and DM videos. Have also placed RD consult for DM diet education for this patient.   Thank you, Nani Gasser. Jonerik Sliker, RN, MSN, CDE  Diabetes Coordinator Inpatient Glycemic Control Team Team Pager [phone] (8am-5pm) 12/23/2016 12:18 PM

## 2016-12-23 NOTE — Progress Notes (Signed)
Nutrition Education Note  RD consulted for nutrition education regarding new onset type 2 diabetes. Patient was unaware of her diabetes diagnosis and was visibly upset about it.  Lab Results  Component Value Date   HGBA1C 6.6 (H) 12/22/2016    RD provided "Carbohydrate Counting for People with Diabetes" handout from the Academy of Nutrition and Dietetics. Provided list of carbohydrates and recommended serving sizes of common foods.  Discussed importance of controlled and consistent carbohydrate intake throughout the day.  Expect fair compliance.  Body mass index is 38.64 kg/m. Pt meets criteria for class 2 obesity based on current BMI.  Current diet order is heart healthy/CHO modified, patient is consuming approximately 50-100% of meals at this time. Labs and medications reviewed. RD contact information provided. If additional nutrition issues arise, please re-consult RD.  Recommend outpatient diabetes diet education, please make referral.  Molli Barrows, RD, LDN, [place] Pager [phone] After Hours Pager [phone]

## 2016-12-23 NOTE — Progress Notes (Signed)
Spoke with patient regarding new diagnosis of DM.  Patient was very upset with new Dx. Reassured her and discussed A1C results of 6.6%.  Also discussed survival skills of DM including hypoglycemia, hyperglycemia, medications, complications of DM, foot care, monitoring, and the importance of follow-up. Patient had already read DM booklet and seemed very engaged.  We also discussed the importance of diet and exercise.  She was appreciative of information.    Thanks, Adah Perl, RN, BC-ADM Inpatient Diabetes Coordinator Pager [phone] (8a-5p)

## 2016-12-23 NOTE — Progress Notes (Signed)
PROGRESS NOTE    Cassandra Greer  GNF:[id number] DOB: 07/05/1975 DOA: 12/21/2016 PCP: Aldona Bar, MD   Brief Narrative: Cassandra Greer is a 41 y.o. femalewith medical history significant ofAnxiety, Panic Disorder, HTN, Hx of Colitis, and Hx of Headaches and other comorbids who presented to the ED with a CC of Chest Pain/Pressure that started approximately two days ago. Over the last few months she has been having intermittent Chest Pain that she has attributed to Anxiety Attacks. She developed sudden onset of Chest Pain 2 days ago that has been intermittent but pretty consistent and complained of 10/10 in Severity. Associated symptoms of Diaphoresis, Radiation across chest into the Right Arm, and Nausea. Patient also complained of Epigastric pain that was worsen with palpation. Patient states nothing mad the chest pain better so she came to the ED for evaluation. BP was high in the ED so she was given Analgesics with Dilaudid, Ketorolac, and GI Cocktail with minimal relief. Patient continued to have CP so TRH was called to Admit for CP r/o. Cardiology was consulted as patient remained diaphoretic and nauseous.Cardiology evaluated and recommended ECHO and if normal no further workup. ECHO was normal. Patient however continued to have persistent Nausea, Chest, and Abdominal Pain so a CT Chest/Abd/Pelvis was ordered and was Negative. Continuing phenergan and adding MRI neck to evaluate right arm numbness/weakness.   Assessment & Plan:   Active Problems:   Leukocytosis   HTN (hypertension)   Chest pain   Accelerated hypertension   Hyperglycemia   Epigastric abdominal pain   Fatty liver disease, nonalcoholic   Thrombocytosis (HCC)   Panic disorder (episodic paroxysmal anxiety)   Type 2 diabetes mellitus with hyperlipidemia (HCC)   Hyperlipidemia   Chest pain Atypical. Cardiac workup negative to date. Echocardiogram with preserved function. Pain has been persistent and has  not responded to any modality of treatment so far. Possibly secondary to anxiety. D-dimer wnl. PERC score of 0. -switch Xanax to Ativan 1 mg BID -GI cocktail -Cardiology recommendations  Nausea Emesis appears resolved. -switch to phenergan PO  Hypertensive urgency Blood pressure continues to be uncontrolled. Started on chlorthalidone on 11/13. -continue lisinopril 40mg  daily -increase to chlorthalidone 50mg  daily  Diabetes mellitus New diagnosis. Hemoglobin A1C of 6.6% -sliding scale insulin while inpatient -discharge with metformin  Anxiety Likely contributing significantly to presentation. Started on Prozac -continue Prozac -start Ativan 1mg  BID  Right arm numbness Has progressed since admission. Now with some weakness. CT chest unremarkable. -MRI cervical spine wo contrast   DVT prophylaxis: Heparin Code Status: Full code Family Communication: None at bedside Disposition Plan: Discharge likely in 24 hours if pain improved and neuro workup negative.   Consultants:   Cardiology  Procedures:   Echocardiogram (11/13) Study Conclusions  - Left ventricle: The cavity size was normal. There was mild   concentric hypertrophy. Systolic function was normal. The   estimated ejection fraction was in the range of 55% to 60%. Wall   motion was normal; there were no regional wall motion   abnormalities. Left ventricular diastolic function parameters   were normal. - Aortic valve: Transvalvular velocity was within the normal range.   There was no stenosis. There was no regurgitation. - Mitral valve: Transvalvular velocity was within the normal range.   There was no evidence for stenosis. There was mild regurgitation. - Right ventricle: The cavity size was normal. Wall thickness was   normal. - Tricuspid valve: There was trivial regurgitation. - Pulmonary arteries: Systolic pressure was within  the normal   range. PA peak pressure: 26 mm Hg (S). - Pericardium,  extracardiac: A trivial pericardial effusion was   identified.  Antimicrobials:  None    Subjective: Patient reports pressure-like chest pain throughout chest and around to back. She also had pain/numbness/tingling from right shoulder down to her fingers on the same side. Symptoms appear to have progressed. No palpitations. She reports some dyspnea on exertion but none at rest.  Objective: Vitals:   12/23/16 0400 12/23/16 0555 12/23/16 0750 12/23/16 0809  BP: (!) 162/108 (!) 152/117 (!) 164/103 (!) 174/109  Pulse: 90 85 78   Resp: 15 20  (!) 21  Temp:  97.7 F (36.5 C) 98.5 F (36.9 C)   TempSrc:  Oral Oral   SpO2:   96%   Weight:  111.9 kg (246 lb 11.2 oz)    Height:        Intake/Output Summary (Last 24 hours) at 12/23/2016 0842 Last data filed at 12/22/2016 2009 Gross per 24 hour  Intake 1200 ml  Output -  Net 1200 ml   Filed Weights   12/21/16 2116 12/22/16 0341 12/23/16 0555  Weight: 113.4 kg (250 lb) 112.9 kg (248 lb 12.8 oz) 111.9 kg (246 lb 11.2 oz)    Examination:  General exam: Appears calm and comfortable Respiratory system: Clear to auscultation. Respiratory effort normal. Cardiovascular system: S1 & S2 heard, RRR. No murmurs, rubs, gallops or clicks. Gastrointestinal system: Abdomen is nondistended, soft and nontender. No organomegaly or masses felt. Normal bowel sounds heard. Central nervous system: Alert and oriented. Right UE/grip strength of 4/5 compared to 5/5 on left. Cranial nerves intact. Reflexes equal.  Extremities: No edema. No calf tenderness Skin: No cyanosis. No rashes Psychiatry: Judgement and insight appear normal. Mood & affect slightly anxious with normal affect.     Data Reviewed: I have personally reviewed following labs and imaging studies  CBC: Recent Labs  Lab 12/21/16 1305 12/21/16 2118 12/22/16 0256 12/23/16 0414  WBC 14.2* 15.2* 14.3* 11.6*  NEUTROABS  --   --  10.9* 7.6  HGB 12.7 12.5 11.4* 12.5  HCT 38.9 38.9  35.8* 38.7  MCV 86.8 87.2 88.0 87.6  PLT 409* 402* 400 161*   Basic Metabolic Panel: Recent Labs  Lab 12/21/16 1305 12/21/16 1603 12/21/16 2118 12/22/16 0256 12/23/16 0414  NA 135  --   --  136 135  K 4.3  --   --  4.1 4.5  CL 100*  --   --  103 98*  CO2 24  --   --  24 29  GLUCOSE 165*  --   --  100* 127*  BUN 11  --   --  13 12  CREATININE 0.82  --  0.81 0.73 0.86  CALCIUM 9.4  --   --  8.6* 9.0  MG  --  1.8  --  1.8 1.9  PHOS  --  3.9  --  3.8 3.8   GFR: Estimated Creatinine Clearance: 112.2 mL/min (by C-G formula based on SCr of 0.86 mg/dL). Liver Function Tests: Recent Labs  Lab 12/21/16 1414 12/22/16 0256 12/23/16 0414  AST 19 14* 16  ALT 12* 11* 13*  ALKPHOS 92 84 92  BILITOT 0.4 0.4 0.7  PROT 7.1 6.6 7.4  ALBUMIN 3.5 3.4* 3.7   Recent Labs  Lab 12/21/16 1414  LIPASE 32   No results for input(s): AMMONIA in the last 168 hours. Coagulation Profile: No results for input(s): INR, PROTIME in the  last 168 hours. Cardiac Enzymes: Recent Labs  Lab 12/21/16 2118 12/22/16 0003 12/22/16 0256  TROPONINI <0.03 <0.03 <0.03   BNP (last 3 results) No results for input(s): PROBNP in the last 8760 hours. HbA1C: Recent Labs    12/22/16 0256  HGBA1C 6.6*   CBG: Recent Labs  Lab 12/21/16 2123 12/22/16 0729 12/22/16 1112 12/22/16 1623 12/22/16 2142  GLUCAP 167* 92 159* 123* 127*   Lipid Profile: Recent Labs    12/22/16 0256  CHOL 255*  HDL 32*  LDLCALC 178*  TRIG 224*  CHOLHDL 8.0   Thyroid Function Tests: Recent Labs    12/21/16 2118  TSH 1.366   Anemia Panel: No results for input(s): VITAMINB12, FOLATE, FERRITIN, TIBC, IRON, RETICCTPCT in the last 72 hours. Sepsis Labs: No results for input(s): PROCALCITON, LATICACIDVEN in the last 168 hours.  Recent Results (from the past 240 hour(s))  MRSA PCR Screening     Status: None   Collection Time: 12/21/16  9:17 PM  Result Value Ref Range Status   MRSA by PCR NEGATIVE NEGATIVE Final     Comment:        The GeneXpert MRSA Assay (FDA approved for NASAL specimens only), is one component of a comprehensive MRSA colonization surveillance program. It is not intended to diagnose MRSA infection nor to guide or monitor treatment for MRSA infections.          Radiology Studies: Dg Chest 2 View  Result Date: 12/21/2016 CLINICAL DATA:  Chest pain. EXAM: CHEST  2 VIEW COMPARISON:  Chest x-ray dated November 04, 2015. FINDINGS: The cardiomediastinal silhouette is normal in size. Normal pulmonary vascularity. No focal consolidation, pleural effusion, or pneumothorax. No acute osseous abnormality. IMPRESSION: No active cardiopulmonary disease. Electronically Signed   By: Titus Dubin M.D.   On: 12/21/2016 13:23   Ct Chest W Contrast  Result Date: 12/22/2016 CLINICAL DATA:  41 year old female with chest pain, shortness of breath, pleurisy, effusion, abdominal pain, nausea, vomiting. EXAM: CT CHEST, ABDOMEN, AND PELVIS WITH CONTRAST TECHNIQUE: Multidetector CT imaging of the chest, abdomen and pelvis was performed following the standard protocol during bolus administration of intravenous contrast. CONTRAST:  127mL ISOVUE-300 IOPAMIDOL (ISOVUE-300) INJECTION 61% COMPARISON:  None. CT Abdomen and Pelvis 04/24/2016. Chest radiographs 12/21/2016 and earlier. FINDINGS: CT CHEST FINDINGS Cardiovascular: Suggestion of some left ventricular hypertrophy. Otherwise no cardiomegaly. No pericardial effusion. Negative thoracic aorta and proximal great vessels. No definite calcified coronary artery atherosclerosis. Mediastinum/Nodes: Negative.  No lymphadenopathy. Lungs/Pleura: Major airways are patent. Both lungs are clear. No pleural effusion or pleural thickening. Borderline to mild pulmonary hyperinflation. Musculoskeletal: Interbody ankylosis of T9-T10 anteriorly. Otherwise thoracic osseous structures appear normal for age. No acute osseous abnormality identified. CT ABDOMEN PELVIS FINDINGS  Hepatobiliary: Evidence of hepatic steatosis again noted. Contracted gallbladder with no surrounding inflammation. Pancreas: Negative. Spleen: Negative. Adrenals/Urinary Tract: Normal adrenal glands. Bilateral renal enhancement and contrast excretion appears symmetric and normal. No perinephric stranding. No hydroureter. Stable small left hemipelvis phlebolith. Unremarkable urinary bladder. Stomach/Bowel: Negative rectum. Redundant but otherwise negative sigmoid colon. Negative left colon. Negative transverse colon aside from mild retained stool. Oral contrast has reached the hepatic flexure. Negative right colon. Normal retrocecal appendix. Negative terminal ileum. No dilated or abnormal small bowel. Negative stomach and duodenum. No abdominal free fluid. Vascular/Lymphatic: Major arterial structures are patent. Minimal iliac calcified atherosclerosis. Portal venous system appears patent. No lymphadenopathy. Reproductive: Mild left ovarian enlargement appears related to a 2.8 cm simple fluid density cyst which is likely physiologic. Negative  uterus and right ovary. Other: Trace pelvic free fluid in the cul-de-sac. Probable small ventral abdominal injection site on the right series 3, image 91. Musculoskeletal: Normal for age lumbar spine. Stable mild sclerosis along the SI joints greater on the right. No acute osseous abnormality identified. IMPRESSION: 1. No acute or inflammatory process identified in the chest or abdomen. 2. No acute or inflammatory process identified in the pelvis; trace pelvic free fluid and a small left ovarian cyst are most likely physiologic. 3. Hepatic steatosis. Electronically Signed   By: Genevie Ann M.D.   On: 12/22/2016 16:47   Ct Abdomen Pelvis W Contrast  Result Date: 12/22/2016 CLINICAL DATA:  41 year old female with chest pain, shortness of breath, pleurisy, effusion, abdominal pain, nausea, vomiting. EXAM: CT CHEST, ABDOMEN, AND PELVIS WITH CONTRAST TECHNIQUE: Multidetector CT  imaging of the chest, abdomen and pelvis was performed following the standard protocol during bolus administration of intravenous contrast. CONTRAST:  173mL ISOVUE-300 IOPAMIDOL (ISOVUE-300) INJECTION 61% COMPARISON:  None. CT Abdomen and Pelvis 04/24/2016. Chest radiographs 12/21/2016 and earlier. FINDINGS: CT CHEST FINDINGS Cardiovascular: Suggestion of some left ventricular hypertrophy. Otherwise no cardiomegaly. No pericardial effusion. Negative thoracic aorta and proximal great vessels. No definite calcified coronary artery atherosclerosis. Mediastinum/Nodes: Negative.  No lymphadenopathy. Lungs/Pleura: Major airways are patent. Both lungs are clear. No pleural effusion or pleural thickening. Borderline to mild pulmonary hyperinflation. Musculoskeletal: Interbody ankylosis of T9-T10 anteriorly. Otherwise thoracic osseous structures appear normal for age. No acute osseous abnormality identified. CT ABDOMEN PELVIS FINDINGS Hepatobiliary: Evidence of hepatic steatosis again noted. Contracted gallbladder with no surrounding inflammation. Pancreas: Negative. Spleen: Negative. Adrenals/Urinary Tract: Normal adrenal glands. Bilateral renal enhancement and contrast excretion appears symmetric and normal. No perinephric stranding. No hydroureter. Stable small left hemipelvis phlebolith. Unremarkable urinary bladder. Stomach/Bowel: Negative rectum. Redundant but otherwise negative sigmoid colon. Negative left colon. Negative transverse colon aside from mild retained stool. Oral contrast has reached the hepatic flexure. Negative right colon. Normal retrocecal appendix. Negative terminal ileum. No dilated or abnormal small bowel. Negative stomach and duodenum. No abdominal free fluid. Vascular/Lymphatic: Major arterial structures are patent. Minimal iliac calcified atherosclerosis. Portal venous system appears patent. No lymphadenopathy. Reproductive: Mild left ovarian enlargement appears related to a 2.8 cm simple fluid  density cyst which is likely physiologic. Negative uterus and right ovary. Other: Trace pelvic free fluid in the cul-de-sac. Probable small ventral abdominal injection site on the right series 3, image 91. Musculoskeletal: Normal for age lumbar spine. Stable mild sclerosis along the SI joints greater on the right. No acute osseous abnormality identified. IMPRESSION: 1. No acute or inflammatory process identified in the chest or abdomen. 2. No acute or inflammatory process identified in the pelvis; trace pelvic free fluid and a small left ovarian cyst are most likely physiologic. 3. Hepatic steatosis. Electronically Signed   By: Genevie Ann M.D.   On: 12/22/2016 16:47   US Abdomen Limited Ruq  Result Date: 12/21/2016 CLINICAL DATA:  Right upper quadrant pain for 2 days EXAM: ULTRASOUND ABDOMEN LIMITED RIGHT UPPER QUADRANT COMPARISON:  04/24/2016 FINDINGS: Gallbladder: No gallstones or wall thickening visualized. No sonographic Murphy sign noted by sonographer. Common bile duct: Diameter: 2.9 mm Liver: Diffuse increased hepatic echogenicity. Portal vein is patent on color Doppler imaging with normal direction of blood flow towards the liver. IMPRESSION: 1. Negative for gallstones or biliary dilatation 2. Diffuse increased hepatic echogenicity consistent with fatty infiltration. Electronically Signed   By: Donavan Foil M.D.   On: 12/21/2016 17:02  Scheduled Meds: . aspirin EC  325 mg Oral Daily  . atorvastatin  40 mg Oral q1800  . chlorthalidone  25 mg Oral Daily  . FLUoxetine  10 mg Oral Daily  . gi cocktail  30 mL Oral Once  . heparin  5,000 Units Subcutaneous Q8H  . Influenza vac split quadrivalent PF  0.5 mL Intramuscular Tomorrow-1000  . insulin aspart  0-9 Units Subcutaneous TID WC  . lisinopril  40 mg Oral Daily  . LORazepam  1 mg Oral BID   Continuous Infusions:   LOS: 1 day     Cordelia Poche, MD [facility] 12/23/2016, 8:42 AM Pager: [phone]  If 7PM-7AM,  please contact night-coverage [email or web address] Password [facility] 12/23/2016, 8:42 AM

## 2016-12-23 NOTE — Progress Notes (Signed)
Patient's pain level has stayed at a 7 with some relief with periods of rest at night.  She had 2 episodes of nausea, gave phenergan for relief.  Her SBP are trending >150s and DBP >100s.  I have texted MD regarding this and also advise of BP being HIGH since 11/13.

## 2016-12-23 NOTE — Progress Notes (Signed)
   Echocardiogram showed no significant abnormalities.  No objective evidence of ischemia.  No further cardiac work up .  Please call with questions.

## 2016-12-24 DIAGNOSIS — F410 Panic disorder [episodic paroxysmal anxiety] without agoraphobia: Secondary | ICD-10-CM

## 2016-12-24 DIAGNOSIS — F1210 Cannabis abuse, uncomplicated: Secondary | ICD-10-CM

## 2016-12-24 DIAGNOSIS — Z560 Unemployment, unspecified: Secondary | ICD-10-CM

## 2016-12-24 DIAGNOSIS — R202 Paresthesia of skin: Secondary | ICD-10-CM

## 2016-12-24 DIAGNOSIS — Z818 Family history of other mental and behavioral disorders: Secondary | ICD-10-CM

## 2016-12-24 DIAGNOSIS — F411 Generalized anxiety disorder: Secondary | ICD-10-CM

## 2016-12-24 DIAGNOSIS — R61 Generalized hyperhidrosis: Secondary | ICD-10-CM

## 2016-12-24 LAB — GLUCOSE, CAPILLARY
GLUCOSE-CAPILLARY: 123 mg/dL — AB (ref 65–99)
Glucose-Capillary: 118 mg/dL — ABNORMAL HIGH (ref 65–99)
Glucose-Capillary: 90 mg/dL (ref 65–99)

## 2016-12-24 MED ORDER — POLYETHYLENE GLYCOL 3350 17 G PO PACK
17.0000 g | PACK | Freq: Every day | ORAL | Status: DC
  Administered 2016-12-24: 17 g via ORAL
  Filled 2016-12-24: qty 1

## 2016-12-24 MED ORDER — LORAZEPAM 2 MG/ML IJ SOLN
1.0000 mg | Freq: Every day | INTRAMUSCULAR | Status: DC | PRN
  Administered 2016-12-24: 1 mg via INTRAVENOUS
  Filled 2016-12-24: qty 1

## 2016-12-24 MED ORDER — IBUPROFEN 600 MG PO TABS
600.0000 mg | ORAL_TABLET | Freq: Four times a day (QID) | ORAL | Status: DC | PRN
  Administered 2016-12-24: 600 mg via ORAL
  Filled 2016-12-24: qty 1

## 2016-12-24 NOTE — Consult Note (Signed)
Neurology Consultation Reason for Consult: Right arm numbness and weaknes Referring Physician: Elder Love, R  CC: Right arm numbness and weakness  History is obtained from: Patient  HPI: Cassandra Greer is a 41 y.o. female who states that she woke up on Saturday with tingling and numbness of her right arm.  It has not been getting better or worse, but has been persistent since that time.  She also has noticed some mild weakness of the right arm.  She also complained of chest pain which is being evaluated by the internal medicine team.  She appears very anxious on examination.   LKW: Last Saturday tpa given?: no, out of window   ROS: A 14 point ROS was performed and is negative except as noted in the HPI.   Past Medical History:  Diagnosis Date  . Chest pain   . Colitis   . Hypertension   . Marijuana abuse   . Morbid obesity ([place])   . Panic attacks   . Type 2 diabetes mellitus with hyperlipidemia ([place]) 12/22/2016     Family History  Problem Relation Age of Onset  . Hypertension Other   . Diabetes Mellitus II Other   . Bipolar disorder Mother   . Other Father        unaware of his PMH     Social History:  reports that  has never smoked. she has never used smokeless tobacco. She reports that she uses drugs. Drug: Marijuana. She reports that she does not drink alcohol.   Exam: Current vital signs: BP (!) 144/102 (BP Location: Left Arm)   Pulse 98   Temp 97.8 F (36.6 C) (Axillary)   Resp 13   Ht 5\' 7"  (1.702 m)   Wt 111.3 kg (245 lb 6.4 oz)   LMP 11/20/2016   SpO2 97%   BMI 38.44 kg/m  Vital signs in last 24 hours: Temp:  [97.6 F (36.4 C)-98.2 F (36.8 C)] 97.8 F (36.6 C) (11/15 1500) Pulse Rate:  [85-98] 98 (11/15 1500) Resp:  [13-22] 13 (11/15 1500) BP: (126-159)/(80-114) 144/102 (11/15 1500) SpO2:  [97 %-100 %] 97 % (11/15 1500) Weight:  [111.3 kg (245 lb 6.4 oz)] 111.3 kg (245 lb 6.4 oz) (11/15 4098)   Physical Exam  Constitutional: Appears  well-developed and well-nourished.  Psych: Affect appropriate to situation Eyes: No scleral injection HENT: No OP obstrucion Head: Normocephalic.  Cardiovascular: Normal rate and regular rhythm.  Respiratory: Effort normal and breath sounds normal to anterior ascultation GI: Soft.  No distension. There is no tenderness.  Skin: WDI  Neuro: Mental Status: Patient is awake, alert, oriented to person, place, month, year, and situation. Patient is able to give a clear and coherent history. No signs of aphasia or neglect Cranial Nerves: II: Visual Fields are full. Pupils are equal, round, and reactive to light.   III,IV, VI: EOMI without ptosis or diploplia.  V: Facial sensation is symmetric to temperature VII: Facial movement is symmetric.  VIII: hearing is intact to voice X: Uvula elevates symmetrically XI: Shoulder shrug is symmetric. XII: tongue is midline without atrophy or fasciculations.  Motor: Tone is normal. Bulk is normal. 5/5 strength was present in bilateral legs in the left arm.  He has slightly inconsistent 4+/5 strength in the right arm Sensory: Sensation is diminished in her right arm circumferentially to the shoulder Cerebellar: FNF  intact bilaterally   I have reviewed labs in epic and the results pertinent to this consultation are: CMP-unremarkable  I have  reviewed the images obtained: MRI C-spine is negative  Impression: 41 year old female with paresthesias of the right arm.  The distribution is unusual for either peripheral or central origin.  One consideration could be a thalamic infarct.  I do think I would favor an MRI of the brain, however if this is negative then I think the next step would be an EMG which would have to be done as an outpatient.  With her deficits currently static, I would not favor empiric treatment at this time.  I do think that anxiety could be playing a role.  Recommendations: 1) MRI brain 2) if negative, no further recommendations on  an inpatient basis.   Roland Rack, MD [facility] [phone]  If 7pm- 7am, please page neurology on call as listed in [place].

## 2016-12-24 NOTE — Progress Notes (Signed)
PROGRESS NOTE    Cassandra Greer  TKZ:[id number] DOB: 05/31/1975 DOA: 12/21/2016 PCP: Aldona Bar, MD   Brief Narrative: Cassandra Greer is a 41 y.o. femalewith medical history significant ofAnxiety, Panic Disorder, HTN, Hx of Colitis, and Hx of Headaches and other comorbids who presented to the ED with a CC of Chest Pain/Pressure that started approximately two days ago. Over the last few months she has been having intermittent Chest Pain that she has attributed to Anxiety Attacks. She developed sudden onset of Chest Pain 2 days ago that has been intermittent but pretty consistent and complained of 10/10 in Severity. Associated symptoms of Diaphoresis, Radiation across chest into the Right Arm, and Nausea. Patient also complained of Epigastric pain that was worsen with palpation. Patient states nothing mad the chest pain better so she came to the ED for evaluation. BP was high in the ED so she was given Analgesics with Dilaudid, Ketorolac, and GI Cocktail with minimal relief. Patient continued to have CP so TRH was called to Admit for CP r/o. Cardiology was consulted as patient remained diaphoretic and nauseous.Cardiology evaluated and recommended ECHO and if normal no further workup. ECHO was normal. Patient however continued to have persistent Nausea, Chest, and Abdominal Pain so a CT Chest/Abd/Pelvis was ordered and was Negative. Continuing phenergan and adding MRI neck to evaluate right arm numbness/weakness.   Assessment & Plan:   Active Problems:   Leukocytosis   HTN (hypertension)   Chest pain   Accelerated hypertension   Hyperglycemia   Epigastric abdominal pain   Fatty liver disease, nonalcoholic   Thrombocytosis (HCC)   Panic disorder (episodic paroxysmal anxiety)   Type 2 diabetes mellitus with hyperlipidemia (HCC)   Hyperlipidemia   Chest pain Atypical. Cardiac workup negative to date. Echocardiogram with preserved function. Pain has been persistent and has  not responded to any modality of treatment so far. Possibly secondary to anxiety. D-dimer wnl. PERC score of 0. Chest pain not improved or worsened by anything. -Continue Ativan 1 mg BID -Cardiology recommendations: signed off  Nausea Emesis appears resolved. -Continue phenergan PO prn  Hypertensive urgency Blood pressure better controlled -Continue lisinopril 40mg  daily -Continue chlorthalidone 50mg  daily  Diabetes mellitus New diagnosis. Hemoglobin A1C of 6.6% -sliding scale insulin while inpatient -discharge with metformin  Anxiety Likely contributing significantly to presentation. Started on Prozac -Continue Prozac -Continue Ativan 1mg  BID -Psychiatry consult  Right arm numbness Has progressed since admission. Now with some weakness. CT chest unremarkable. -MRI cervical spine wo contrast  Mild C5-6 disc bulge Mild C5-6 foraminal stenosis Although this could contribute to symptoms, symptoms appear out of proportion to lesions -neurology consult and recommendations  RUQ tenderness Present on light touch. Has been present for almost one week. No rash -Voltaren gel   DVT prophylaxis: Heparin Code Status: Full code Family Communication: None at bedside Disposition Plan: Discharge likely in 24 hours if pain improved and neuro workup negative.   Consultants:   Cardiology (Signed off on 12/24/16)  Psychiatry  Procedures:   Echocardiogram (11/13) Study Conclusions  - Left ventricle: The cavity size was normal. There was mild   concentric hypertrophy. Systolic function was normal. The   estimated ejection fraction was in the range of 55% to 60%. Wall   motion was normal; there were no regional wall motion   abnormalities. Left ventricular diastolic function parameters   were normal. - Aortic valve: Transvalvular velocity was within the normal range.   There was no stenosis. There was no regurgitation. -  Mitral valve: Transvalvular velocity was within the  normal range.   There was no evidence for stenosis. There was mild regurgitation. - Right ventricle: The cavity size was normal. Wall thickness was   normal. - Tricuspid valve: There was trivial regurgitation. - Pulmonary arteries: Systolic pressure was within the normal   range. PA peak pressure: 26 mm Hg (S). - Pericardium, extracardiac: A trivial pericardial effusion was   identified.  Antimicrobials:  None    Subjective: Chest pain is persistent. No improvement or worsening. RUQ pain is also present. Right arm numbness/weakness persistent.  Objective: Vitals:   12/23/16 2016 12/24/16 0050 12/24/16 0624 12/24/16 0806  BP: (!) 159/114 126/80 (!) 143/92 (!) 132/99  Pulse: 89 85 94   Resp: 13 17 (!) 22 17  Temp: 98 F (36.7 C) 98.2 F (36.8 C) 97.8 F (36.6 C) 98.1 F (36.7 C)  TempSrc: Oral Oral Oral Axillary  SpO2: 97% 98% 100%   Weight:   111.3 kg (245 lb 6.4 oz)   Height:        Intake/Output Summary (Last 24 hours) at 12/24/2016 1036 Last data filed at 12/24/2016 0500 Gross per 24 hour  Intake 360 ml  Output 100 ml  Net 260 ml   Filed Weights   12/22/16 0341 12/23/16 0555 12/24/16 0624  Weight: 112.9 kg (248 lb 12.8 oz) 111.9 kg (246 lb 11.2 oz) 111.3 kg (245 lb 6.4 oz)    Examination:  General exam: Appears calm and comfortable Respiratory system: Clear to auscultation. Respiratory effort normal. Cardiovascular system: S1 & S2 heard, RRR. No murmurs, rubs, gallops or clicks. Gastrointestinal system: Abdomen is nondistended, soft with tenderness to light touch of RUQ. No organomegaly or masses felt. Normal bowel sounds heard. Central nervous system: Alert and oriented. Right UE/grip strength of 4/5 compared to 5/5 on left. Cranial nerves intact. Reflexes equal.  Extremities: No edema. No calf tenderness Skin: No cyanosis. No rashes Psychiatry: Judgement and insight appear normal. Mood & affect slightly anxious with normal affect.    Data Reviewed: I  have personally reviewed following labs and imaging studies  CBC: Recent Labs  Lab 12/21/16 1305 12/21/16 2118 12/22/16 0256 12/23/16 0414  WBC 14.2* 15.2* 14.3* 11.6*  NEUTROABS  --   --  10.9* 7.6  HGB 12.7 12.5 11.4* 12.5  HCT 38.9 38.9 35.8* 38.7  MCV 86.8 87.2 88.0 87.6  PLT 409* 402* 400 956*   Basic Metabolic Panel: Recent Labs  Lab 12/21/16 1305 12/21/16 1603 12/21/16 2118 12/22/16 0256 12/23/16 0414  NA 135  --   --  136 135  K 4.3  --   --  4.1 4.5  CL 100*  --   --  103 98*  CO2 24  --   --  24 29  GLUCOSE 165*  --   --  100* 127*  BUN 11  --   --  13 12  CREATININE 0.82  --  0.81 0.73 0.86  CALCIUM 9.4  --   --  8.6* 9.0  MG  --  1.8  --  1.8 1.9  PHOS  --  3.9  --  3.8 3.8   GFR: Estimated Creatinine Clearance: 111.9 mL/min (by C-G formula based on SCr of 0.86 mg/dL). Liver Function Tests: Recent Labs  Lab 12/21/16 1414 12/22/16 0256 12/23/16 0414  AST 19 14* 16  ALT 12* 11* 13*  ALKPHOS 92 84 92  BILITOT 0.4 0.4 0.7  PROT 7.1 6.6 7.4  ALBUMIN 3.5  3.4* 3.7   Recent Labs  Lab 12/21/16 1414  LIPASE 32   No results for input(s): AMMONIA in the last 168 hours. Coagulation Profile: No results for input(s): INR, PROTIME in the last 168 hours. Cardiac Enzymes: Recent Labs  Lab 12/21/16 2118 12/22/16 0003 12/22/16 0256  TROPONINI <0.03 <0.03 <0.03   BNP (last 3 results) No results for input(s): PROBNP in the last 8760 hours. HbA1C: Recent Labs    12/22/16 0256  HGBA1C 6.6*   CBG: Recent Labs  Lab 12/23/16 0748 12/23/16 1207 12/23/16 1617 12/23/16 2023 12/24/16 0752  GLUCAP 175* 108* 117* 115* 118*   Lipid Profile: Recent Labs    12/22/16 0256  CHOL 255*  HDL 32*  LDLCALC 178*  TRIG 224*  CHOLHDL 8.0   Thyroid Function Tests: Recent Labs    12/21/16 2118  TSH 1.366   Anemia Panel: No results for input(s): VITAMINB12, FOLATE, FERRITIN, TIBC, IRON, RETICCTPCT in the last 72 hours. Sepsis Labs: No results for  input(s): PROCALCITON, LATICACIDVEN in the last 168 hours.  Recent Results (from the past 240 hour(s))  MRSA PCR Screening     Status: None   Collection Time: 12/21/16  9:17 PM  Result Value Ref Range Status   MRSA by PCR NEGATIVE NEGATIVE Final    Comment:        The GeneXpert MRSA Assay (FDA approved for NASAL specimens only), is one component of a comprehensive MRSA colonization surveillance program. It is not intended to diagnose MRSA infection nor to guide or monitor treatment for MRSA infections.          Radiology Studies: Ct Chest W Contrast  Result Date: 12/22/2016 CLINICAL DATA:  41 year old female with chest pain, shortness of breath, pleurisy, effusion, abdominal pain, nausea, vomiting. EXAM: CT CHEST, ABDOMEN, AND PELVIS WITH CONTRAST TECHNIQUE: Multidetector CT imaging of the chest, abdomen and pelvis was performed following the standard protocol during bolus administration of intravenous contrast. CONTRAST:  113mL ISOVUE-300 IOPAMIDOL (ISOVUE-300) INJECTION 61% COMPARISON:  None. CT Abdomen and Pelvis 04/24/2016. Chest radiographs 12/21/2016 and earlier. FINDINGS: CT CHEST FINDINGS Cardiovascular: Suggestion of some left ventricular hypertrophy. Otherwise no cardiomegaly. No pericardial effusion. Negative thoracic aorta and proximal great vessels. No definite calcified coronary artery atherosclerosis. Mediastinum/Nodes: Negative.  No lymphadenopathy. Lungs/Pleura: Major airways are patent. Both lungs are clear. No pleural effusion or pleural thickening. Borderline to mild pulmonary hyperinflation. Musculoskeletal: Interbody ankylosis of T9-T10 anteriorly. Otherwise thoracic osseous structures appear normal for age. No acute osseous abnormality identified. CT ABDOMEN PELVIS FINDINGS Hepatobiliary: Evidence of hepatic steatosis again noted. Contracted gallbladder with no surrounding inflammation. Pancreas: Negative. Spleen: Negative. Adrenals/Urinary Tract: Normal adrenal  glands. Bilateral renal enhancement and contrast excretion appears symmetric and normal. No perinephric stranding. No hydroureter. Stable small left hemipelvis phlebolith. Unremarkable urinary bladder. Stomach/Bowel: Negative rectum. Redundant but otherwise negative sigmoid colon. Negative left colon. Negative transverse colon aside from mild retained stool. Oral contrast has reached the hepatic flexure. Negative right colon. Normal retrocecal appendix. Negative terminal ileum. No dilated or abnormal small bowel. Negative stomach and duodenum. No abdominal free fluid. Vascular/Lymphatic: Major arterial structures are patent. Minimal iliac calcified atherosclerosis. Portal venous system appears patent. No lymphadenopathy. Reproductive: Mild left ovarian enlargement appears related to a 2.8 cm simple fluid density cyst which is likely physiologic. Negative uterus and right ovary. Other: Trace pelvic free fluid in the cul-de-sac. Probable small ventral abdominal injection site on the right series 3, image 91. Musculoskeletal: Normal for age lumbar spine. Stable mild sclerosis  along the SI joints greater on the right. No acute osseous abnormality identified. IMPRESSION: 1. No acute or inflammatory process identified in the chest or abdomen. 2. No acute or inflammatory process identified in the pelvis; trace pelvic free fluid and a small left ovarian cyst are most likely physiologic. 3. Hepatic steatosis. Electronically Signed   By: Genevie Ann M.D.   On: 12/22/2016 16:47   Mr Cervical Spine Wo Contrast  Result Date: 12/23/2016 CLINICAL DATA:  Radiculopathy. Chest pressure wish shoulder and bilateral arm pain. Right arm tingling. EXAM: MRI CERVICAL SPINE WITHOUT CONTRAST TECHNIQUE: Multiplanar, multisequence MR imaging of the cervical spine was performed. No intravenous contrast was administered. COMPARISON:  Cervical spine radiographs 06/19/2008 FINDINGS: Alignment: Mild cervical kyphosis.  No listhesis. Vertebrae: No  fracture, suspicious osseous lesion, or significant marrow edema. Cord: Normal signal and morphology. Posterior Fossa, vertebral arteries, paraspinal tissues: Bilateral perineural cysts in the lower cervical and upper thoracic neural foramina. Preserve vertebral artery flow voids. Unremarkable craniocervical junction. Disc levels: C2-3:  Mild bilateral facet arthrosis without stenosis. C3-4:  Negative. C4-5: Mild right uncovertebral spurring without significant stenosis. C5-6: Disc bulging, broad right paracentral disc osteophyte complex, and uncovertebral spurring result in mild right neural foraminal stenosis and a slight impression on the ventral spinal cord without significant spinal stenosis. C6-7:  Negative. C7-T1:  Negative. 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 No significant spinal stenosis. Electronically Signed   By: Logan Bores M.D.   On: 12/23/2016 11:28   Ct Abdomen Pelvis W Contrast  Result Date: 12/22/2016 CLINICAL DATA:  41 year old female with chest pain, shortness of breath, pleurisy, effusion, abdominal pain, nausea, vomiting. EXAM: CT CHEST, ABDOMEN, AND PELVIS WITH CONTRAST TECHNIQUE: Multidetector CT imaging of the chest, abdomen and pelvis was performed following the standard protocol during bolus administration of intravenous contrast. CONTRAST:  117mL ISOVUE-300 IOPAMIDOL (ISOVUE-300) INJECTION 61% COMPARISON:  None. CT Abdomen and Pelvis 04/24/2016. Chest radiographs 12/21/2016 and earlier. FINDINGS: CT CHEST FINDINGS Cardiovascular: Suggestion of some left ventricular hypertrophy. Otherwise no cardiomegaly. No pericardial effusion. Negative thoracic aorta and proximal great vessels. No definite calcified coronary artery atherosclerosis. Mediastinum/Nodes: Negative.  No lymphadenopathy. Lungs/Pleura: Major airways are patent. Both lungs are clear. No pleural effusion or pleural thickening. Borderline to mild  pulmonary hyperinflation. Musculoskeletal: Interbody ankylosis of T9-T10 anteriorly. Otherwise thoracic osseous structures appear normal for age. No acute osseous abnormality identified. CT ABDOMEN PELVIS FINDINGS Hepatobiliary: Evidence of hepatic steatosis again noted. Contracted gallbladder with no surrounding inflammation. Pancreas: Negative. Spleen: Negative. Adrenals/Urinary Tract: Normal adrenal glands. Bilateral renal enhancement and contrast excretion appears symmetric and normal. No perinephric stranding. No hydroureter. Stable small left hemipelvis phlebolith. Unremarkable urinary bladder. Stomach/Bowel: Negative rectum. Redundant but otherwise negative sigmoid colon. Negative left colon. Negative transverse colon aside from mild retained stool. Oral contrast has reached the hepatic flexure. Negative right colon. Normal retrocecal appendix. Negative terminal ileum. No dilated or abnormal small bowel. Negative stomach and duodenum. No abdominal free fluid. Vascular/Lymphatic: Major arterial structures are patent. Minimal iliac calcified atherosclerosis. Portal venous system appears patent. No lymphadenopathy. Reproductive: Mild left ovarian enlargement appears related to a 2.8 cm simple fluid density cyst which is likely physiologic. Negative uterus and right ovary. Other: Trace pelvic free fluid in the cul-de-sac. Probable small ventral abdominal injection site on the right series 3, image 91. Musculoskeletal: Normal for age lumbar spine. Stable mild sclerosis along the SI joints greater on the right. No acute osseous abnormality identified.  IMPRESSION: 1. No acute or inflammatory process identified in the chest or abdomen. 2. No acute or inflammatory process identified in the pelvis; trace pelvic free fluid and a small left ovarian cyst are most likely physiologic. 3. Hepatic steatosis. Electronically Signed   By: Genevie Ann M.D.   On: 12/22/2016 16:47        Scheduled Meds: . aspirin EC  325 mg  Oral Daily  . atorvastatin  40 mg Oral q1800  . chlorthalidone  50 mg Oral Daily  . FLUoxetine  10 mg Oral Daily  . heparin  5,000 Units Subcutaneous Q8H  . insulin aspart  0-9 Units Subcutaneous TID WC  . lisinopril  40 mg Oral Daily  . LORazepam  1 mg Oral BID  . polyethylene glycol  17 g Oral Daily   Continuous Infusions:   LOS: 2 days     Cordelia Poche, MD [facility] 12/24/2016, 10:36 AM Pager: [phone]  If 7PM-7AM, please contact night-coverage [email or web address] Password [email or web address] 12/24/2016, 10:36 AM

## 2016-12-24 NOTE — Progress Notes (Signed)
Pt requesting to go home, states she will go whether she has orders or not, has already called spouse to pick her up, explained that she had an MRI of the head ordered and the importance of having it done, refused to have MRI, MD notified, AMA papers signed, home medications returned, encouraged pt to follow up with her pcp, left facility via w/c, condition stable.

## 2016-12-24 NOTE — Evaluation (Signed)
Physical Therapy Evaluation Patient Details Name: Cassandra Greer MRN: [id number] DOB: [birth year] Today's Date: 12/24/2016   History of Present Illness  Cassandra Greer is a 41 y.o.  female with medical history significant of Anxiety, Panic Disorder, HTN, Hx of Colitis, and Hx of Headaches, obesity, HLD who was admitted 12/21/16 with chest pain/pressure.  Patient also with HTN, LUE numbness/weakness, and new dx DM.  Clinical Impression  Patient functioning at independent level with mobility and gait.  Scored 24/24 on DGI balance assessment indicating low fall risk.  No further PT needs identified - PT will sign off.    Follow Up Recommendations No PT follow up;Supervision - Intermittent    Equipment Recommendations  None recommended by PT    Recommendations for Other Services       Precautions / Restrictions Precautions Precautions: None Restrictions Weight Bearing Restrictions: No      Mobility  Bed Mobility Overal bed mobility: Independent                Transfers Overall transfer level: Independent Equipment used: None                Ambulation/Gait Ambulation/Gait assistance: Independent Ambulation Distance (Feet): 250 Feet Assistive device: None Gait Pattern/deviations: Step-through pattern;Decreased stride length   Gait velocity interpretation: at or above normal speed for age/gender General Gait Details: Patient with good gait pattern, balance, and speed.  Stairs Stairs: Yes Stairs assistance: Supervision Stair Management: No rails;Alternating pattern;Forwards Number of Stairs: 3 General stair comments: Patient able to negotiate stairs with no rail and supervision.  Wheelchair Mobility    Modified Rankin (Stroke Patients Only)       Balance Overall balance assessment: Independent                               Standardized Balance Assessment Standardized Balance Assessment : Dynamic Gait Index   Dynamic Gait  Index Level Surface: Normal Change in Gait Speed: Normal Gait with Horizontal Head Turns: Normal Gait with Vertical Head Turns: Normal Gait and Pivot Turn: Normal Step Over Obstacle: Normal Step Around Obstacles: Normal Steps: Normal Total Score: 24       Pertinent Vitals/Pain Pain Assessment: 0-10 Pain Score: 4  Pain Location: chest Pain Descriptors / Indicators: Sore Pain Intervention(s): Monitored during session    Home Living Family/patient expects to be discharged to:: Private residence Living Arrangements: Spouse/significant other;Children Available Help at Discharge: Family;Available 24 hours/day(Adult children - oldest daughter - available) Type of Home: House Home Access: Stairs to enter Entrance Stairs-Rails: None Entrance Stairs-Number of Steps: 2 Home Layout: One level Home Equipment: None      Prior Function Level of Independence: Independent         Comments: Patient chose not to drive due to panic attacks.     Hand Dominance        Extremity/Trunk Assessment   Upper Extremity Assessment Upper Extremity Assessment: Overall WFL for tasks assessed(LUE tested [facility] regarding strength)    Lower Extremity Assessment Lower Extremity Assessment: Overall WFL for tasks assessed       Communication   Communication: No difficulties  Cognition Arousal/Alertness: Awake/alert Behavior During Therapy: WFL for tasks assessed/performed;Anxious Overall Cognitive Status: Within Functional Limits for tasks assessed                                 General Comments: Behavior  improved once ambulating in hallway.      General Comments      Exercises     Assessment/Plan    PT Assessment Patent does not need any further PT services  PT Problem List         PT Treatment Interventions      PT Goals (Current goals can be found in the Care Plan section)  Acute Rehab PT Goals Patient Stated Goal: To return home soon PT Goal Formulation:  All assessment and education complete, DC therapy    Frequency     Barriers to discharge        Co-evaluation               AM-PAC PT "6 Clicks" Daily Activity  Outcome Measure Difficulty turning over in bed (including adjusting bedclothes, sheets and blankets)?: None Difficulty moving from lying on back to sitting on the side of the bed? : None Difficulty sitting down on and standing up from a chair with arms (e.g., wheelchair, bedside commode, etc,.)?: None Help needed moving to and from a bed to chair (including a wheelchair)?: None Help needed walking in hospital room?: None Help needed climbing 3-5 steps with a railing? : None 6 Click Score: 24    End of Session Equipment Utilized During Treatment: Gait belt Activity Tolerance: Patient tolerated treatment well Patient left: in bed;with call bell/phone within reach Nurse Communication: Mobility status PT Visit Diagnosis: Difficulty in walking, not elsewhere classified (R26.2);Pain Pain - part of body: (Chest)    Time: 4259-5638 PT Time Calculation (min) (ACUTE ONLY): 15 min   Charges:   PT Evaluation $PT Eval Moderate Complexity: 1 Mod     PT G Codes:        Carita Pian. Sanjuana Kava, [facility] Pager [place] 12/24/2016, 5:37 PM

## 2016-12-24 NOTE — Progress Notes (Signed)
Subjective: Currently patient is having a panic attack and having difficult time giving the history.  She is hyperventilating and stating her panic attack is because she does not want to be here.  She is able to tell me that her right arm is continuing to have that tingling sensation from the shoulder to the hand and it is not in any dermatomal distribution.  There is no change in her strength.  There is no other tingling or abnormal sensations throughout her body, no blurred vision, no difficulty with expressing herself or dysarthria.  Patient states that the tingling in her right arm actually started on Saturday and has not stopped.  Per history and physical over the last few months she has been having intermittent chest pain and anxiety attacks.  These have been associated symptoms of sweating, radiation across her chest into her right arm and nausea.  Patient did obtain an echo which was normal.  She also had an MRI of her cervical spine which showed no significant pathology that would explain a whole arm tingling.  Exam: Vitals:   12/24/16 0624 12/24/16 0806  BP: (!) 143/92 (!) 132/99  Pulse: 94   Resp: (!) 22 17  Temp: 97.8 F (36.6 C) 98.1 F (36.7 C)  SpO2: 100%     HEENT-  Normocephalic, no lesions, without obvious abnormality.  Normal external eye and conjunctiva.  Normal TM's bilaterally.  Normal auditory canals and external ears. Normal external nose, mucus membranes and septum.  Normal pharynx. Cardiovascular- S1, S2 normal, pulses palpable throughout   Lungs- chest clear, no wheezing, rales, normal symmetric air entry Abdomen- soft, non-tender; bowel sounds normal; no masses,  no organomegaly Extremities- no edema Lymph-no adenopathy palpable Musculoskeletal-no joint tenderness, deformity or swelling Skin-warm and dry, no hyperpigmentation, vitiligo, or suspicious lesions   Neuro:  CN: Pupils are equal and round 63mm. They are symmetrically reactive from 3-->2 mm. EOMI  without nystagmus. Facial sensation is intact to light touch. Face is symmetric at rest with normal strength and mobility. Hearing is intact to conversational voice. Palate elevates symmetrically and uvula is midline. Voice is normal in tone, pitch and quality. Bilateral SCM and trapezii are 5/5. Tongue is midline with normal bulk and mobility.  Motor: Normal bulk, tone, and strength. 5/5 throughout. No drift.  Postural tremor however I believe this is secondary to her anxiety that she is having right now Sensation: Intact to light touch.  DTRs: 2+, symmetric  Toes downgoing bilaterally. No pathologic reflexes.  Coordination: Finger-to-nose and heel-to-shin are without dysmetria   Medications:  Scheduled: . aspirin EC  325 mg Oral Daily  . atorvastatin  40 mg Oral q1800  . chlorthalidone  50 mg Oral Daily  . FLUoxetine  10 mg Oral Daily  . heparin  5,000 Units Subcutaneous Q8H  . insulin aspart  0-9 Units Subcutaneous TID WC  . lisinopril  40 mg Oral Daily  . LORazepam  1 mg Oral BID  . polyethylene glycol  17 g Oral Daily   Continuous:   Pertinent Labs/Diagnostics:   Ct Chest W Contrast  Result Date: 12/22/2016 CLINICAL DATA:  41 year old female with chest pain, shortness of breath, pleurisy, effusion, abdominal pain, nausea, vomiting. EXAM: CT CHEST, ABDOMEN, AND PELVIS WITH CONTRAST TECHNIQUE: Multidetector CT imaging of the chest, abdomen and pelvis was performed following the standard protocol during bolus administration of intravenous contrast. CONTRAST:  129mL ISOVUE-300 IOPAMIDOL (ISOVUE-300) INJECTION 61% COMPARISON:  None. CT Abdomen and Pelvis 04/24/2016. Chest radiographs 12/21/2016 and  earlier. FINDINGS: CT CHEST FINDINGS Cardiovascular: Suggestion of some left ventricular hypertrophy. Otherwise no cardiomegaly. No pericardial effusion. Negative thoracic aorta and proximal great vessels. No definite calcified coronary artery atherosclerosis. Mediastinum/Nodes: Negative.  No  lymphadenopathy. Lungs/Pleura: Major airways are patent. Both lungs are clear. No pleural effusion or pleural thickening. Borderline to mild pulmonary hyperinflation. Musculoskeletal: Interbody ankylosis of T9-T10 anteriorly. Otherwise thoracic osseous structures appear normal for age. No acute osseous abnormality identified. CT ABDOMEN PELVIS FINDINGS Hepatobiliary: Evidence of hepatic steatosis again noted. Contracted gallbladder with no surrounding inflammation. Pancreas: Negative. Spleen: Negative. Adrenals/Urinary Tract: Normal adrenal glands. Bilateral renal enhancement and contrast excretion appears symmetric and normal. No perinephric stranding. No hydroureter. Stable small left hemipelvis phlebolith. Unremarkable urinary bladder. Stomach/Bowel: Negative rectum. Redundant but otherwise negative sigmoid colon. Negative left colon. Negative transverse colon aside from mild retained stool. Oral contrast has reached the hepatic flexure. Negative right colon. Normal retrocecal appendix. Negative terminal ileum. No dilated or abnormal small bowel. Negative stomach and duodenum. No abdominal free fluid. Vascular/Lymphatic: Major arterial structures are patent. Minimal iliac calcified atherosclerosis. Portal venous system appears patent. No lymphadenopathy. Reproductive: Mild left ovarian enlargement appears related to a 2.8 cm simple fluid density cyst which is likely physiologic. Negative uterus and right ovary. Other: Trace pelvic free fluid in the cul-de-sac. Probable small ventral abdominal injection site on the right series 3, image 91. Musculoskeletal: Normal for age lumbar spine. Stable mild sclerosis along the SI joints greater on the right. No acute osseous abnormality identified. IMPRESSION: 1. No acute or inflammatory process identified in the chest or abdomen. 2. No acute or inflammatory process identified in the pelvis; trace pelvic free fluid and a small left ovarian cyst are most likely physiologic.  3. Hepatic steatosis. Electronically Signed   By: Genevie Ann M.D.   On: 12/22/2016 16:47   Mr Cervical Spine Wo Contrast  Result Date: 12/23/2016 CLINICAL DATA:  Radiculopathy. Chest pressure wish shoulder and bilateral arm pain. Right arm tingling. EXAM: MRI CERVICAL SPINE WITHOUT CONTRAST TECHNIQUE: Multiplanar, multisequence MR imaging of the cervical spine was performed. No intravenous contrast was administered. COMPARISON:  Cervical spine radiographs 06/19/2008 FINDINGS: Alignment: Mild cervical kyphosis.  No listhesis. Vertebrae: No fracture, suspicious osseous lesion, or significant marrow edema. Cord: Normal signal and morphology. Posterior Fossa, vertebral arteries, paraspinal tissues: Bilateral perineural cysts in the lower cervical and upper thoracic neural foramina. Preserve vertebral artery flow voids. Unremarkable craniocervical junction. Disc levels: C2-3:  Mild bilateral facet arthrosis without stenosis. C3-4:  Negative. C4-5: Mild right uncovertebral spurring without significant stenosis. C5-6: Disc bulging, broad right paracentral disc osteophyte complex, and uncovertebral spurring result in mild right neural foraminal stenosis and a slight impression on the ventral spinal cord without significant spinal stenosis. C6-7:  Negative. C7-T1:  Negative. 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 No significant spinal stenosis. Electronically Signed   By: Logan Bores M.D.   On: 12/23/2016 11:28   Ct Abdomen Pelvis W Contrast  Result Date: 12/22/2016 CLINICAL DATA:  41 year old female with chest pain, shortness of breath, pleurisy, effusion, abdominal pain, nausea, vomiting. EXAM: CT CHEST, ABDOMEN, AND PELVIS WITH CONTRAST TECHNIQUE: Multidetector CT imaging of the chest, abdomen and pelvis was performed following the standard protocol during bolus administration of intravenous contrast. CONTRAST:  164mL ISOVUE-300 IOPAMIDOL  (ISOVUE-300) INJECTION 61% COMPARISON:  None. CT Abdomen and Pelvis 04/24/2016. Chest radiographs 12/21/2016 and earlier. FINDINGS: CT CHEST FINDINGS Cardiovascular: Suggestion of some left ventricular hypertrophy.  Otherwise no cardiomegaly. No pericardial effusion. Negative thoracic aorta and proximal great vessels. No definite calcified coronary artery atherosclerosis. Mediastinum/Nodes: Negative.  No lymphadenopathy. Lungs/Pleura: Major airways are patent. Both lungs are clear. No pleural effusion or pleural thickening. Borderline to mild pulmonary hyperinflation. Musculoskeletal: Interbody ankylosis of T9-T10 anteriorly. Otherwise thoracic osseous structures appear normal for age. No acute osseous abnormality identified. CT ABDOMEN PELVIS FINDINGS Hepatobiliary: Evidence of hepatic steatosis again noted. Contracted gallbladder with no surrounding inflammation. Pancreas: Negative. Spleen: Negative. Adrenals/Urinary Tract: Normal adrenal glands. Bilateral renal enhancement and contrast excretion appears symmetric and normal. No perinephric stranding. No hydroureter. Stable small left hemipelvis phlebolith. Unremarkable urinary bladder. Stomach/Bowel: Negative rectum. Redundant but otherwise negative sigmoid colon. Negative left colon. Negative transverse colon aside from mild retained stool. Oral contrast has reached the hepatic flexure. Negative right colon. Normal retrocecal appendix. Negative terminal ileum. No dilated or abnormal small bowel. Negative stomach and duodenum. No abdominal free fluid. Vascular/Lymphatic: Major arterial structures are patent. Minimal iliac calcified atherosclerosis. Portal venous system appears patent. No lymphadenopathy. Reproductive: Mild left ovarian enlargement appears related to a 2.8 cm simple fluid density cyst which is likely physiologic. Negative uterus and right ovary. Other: Trace pelvic free fluid in the cul-de-sac. Probable small ventral abdominal injection site on the  right series 3, image 91. Musculoskeletal: Normal for age lumbar spine. Stable mild sclerosis along the SI joints greater on the right. No acute osseous abnormality identified. IMPRESSION: 1. No acute or inflammatory process identified in the chest or abdomen. 2. No acute or inflammatory process identified in the pelvis; trace pelvic free fluid and a small left ovarian cyst are most likely physiologic. 3. Hepatic steatosis. Electronically Signed   By: Genevie Ann M.D.   On: 12/22/2016 16:47     Etta Quill PA-C [facility] [phone]  Impression:   Recommendations: 1)    12/24/2016, 10:02 AM

## 2016-12-24 NOTE — Consult Note (Addendum)
[facility] Face-to-Face Psychiatry Consult   Reason for Consult:  Anxiety  Referring Physician:  Dr. Lonny Prude Patient Identification: Cassandra Greer MRN:  [id number] Principal Diagnosis: Generalized anxiety disorder with panic attacks Diagnosis:   Patient Active Problem List   Diagnosis Date Noted  . Type 2 diabetes mellitus with hyperlipidemia ([place]) [E11.69, E78.5] 12/22/2016  . Hyperlipidemia [E78.5] 12/22/2016  . Chest pain [R07.9] 12/21/2016  . Accelerated hypertension [I10] 12/21/2016  . Hyperglycemia [R73.9] 12/21/2016  . Epigastric abdominal pain [R10.13] 12/21/2016  . Fatty liver disease, nonalcoholic [Q19.7] 58/83/2549  . Thrombocytosis ([place]) [D47.3] 12/21/2016  . Panic disorder (episodic paroxysmal anxiety) [F41.0] 12/21/2016  . HTN (hypertension) [I10] 03/08/2013  . Hypokalemia [E87.6] 03/08/2013  . Headache(784.0) [R51] 03/08/2013  . Colitis [K52.9] 03/05/2013  . Leukocytosis [D72.829] 03/05/2013  . Sinusitis [J32.9] 03/05/2013    Total Time spent with patient: 1 hour  Subjective:   Cassandra Greer is a 41 y.o. female patient admitted with chest pain.  HPI:   Per chart review, patient was admitted for intermittent chest pain with onset 2 days prior to admission. She reported 10/10 chest pain with associated symptoms of diaphoresis, radiation across chest into right arm and nausea. Workup was unremarkable. Her blood pressure was elevated in the ED so she was given Dilaudid, Ketorolac and a GI cocktail in the ED with minimal relief. Psychiatry was consulted due to concern that her presentation is secondary to anxiety. She was started on Ativan 1 mg BID (11/14) and Prozac 10 mg daily (11/12) in the hospital. UDS was positive for benzodiazepines, opiates and THC on admission.   On interview, Cassandra Greer reports that she has struggled with anxiety for a couple years. She reports that it feels like her "chest is being crushed" and like "jello on the inside" when she is  having severe anxiety. She feels like she loses control and has feelings of doom. She reports that her anxiety gets this severe about 3 times a month. This is the first time that she has been to the ED for anxiety. She experienced these symptoms today as well. She identifies several triggers that cause anxiety such as the rain and small spaces. She reports anxiety in social interactions. She reports staying to her self. She has one close friend who is her neighbor. She reports frequently worrying. She denies OCD symptoms such as checking or counting behaviors or fear of germs.   Cassandra Greer reports poor sleep due to recently having her sleep medication adjusted. She was taking Seroquel 150 mg qhs but her doctor switched her to Trazodone. Trazodone was ineffective. She reports that it made her drowsy but she could not fall asleep after taking it. She was switched to Atarax and then back to Seroquel 50 mg qhs. She reports a history of staying up for several days but denies euphoria or increased energy. She reports that during these times she was up because she could not fall asleep due to worrying. She reports painting her house one of these times but she denies other goal directed activities. She denies a history of AVH. She reports that her mother has bipolar disorder. She was educated about the signs of mania in the setting of antidepressant use. She is aware to inform her family that if this happens then they should bring her to the hospital.   Past Psychiatric History: Anxiety   Risk to Self: Is patient at risk for suicide?: No Risk to Others:  None. Denies HI.   Prior Inpatient  Therapy:  Denies  Prior Outpatient Therapy:  She recently established care at [name]. She sees Dr. Loreen Freud. Her first appointment was Monday but she became very anxious and had an elevated blood pressure so he recommended that she go to the ED. She has seen a therapist in the past and found it helpful for anxiety. She has taken  Klonopin, Xanax and Neurontin for anxiety. She reports that Neurontin was ineffective.    Past Medical History:  Past Medical History:  Diagnosis Date  . Chest pain   . Colitis   . Hypertension   . Marijuana abuse   . Morbid obesity ([place])   . Panic attacks   . Type 2 diabetes mellitus with hyperlipidemia ([place]) 12/22/2016    Past Surgical History:  Procedure Laterality Date  . TUBAL LIGATION     Family History:  Family History  Problem Relation Age of Onset  . Hypertension Other   . Diabetes Mellitus II Other   . Bipolar disorder Mother   . Other Father        unaware of his PMH   Family Psychiatric  History: Mother-bipolar disorder  Social History:  Social History   Substance and Sexual Activity  Alcohol Use No     Social History   Substance and Sexual Activity  Drug Use Yes  . Types: Marijuana   Comment: 2-3 x/month    Social History   Socioeconomic History  . Marital status: Married    Spouse name: None  . Number of children: None  . Years of education: None  . Highest education level: None  Social Needs  . Financial resource strain: None  . Food insecurity - worry: None  . Food insecurity - inability: None  . Transportation needs - medical: None  . Transportation needs - non-medical: None  Occupational History  . None  Tobacco Use  . Smoking status: Never Smoker  . Smokeless tobacco: Never Used  Substance and Sexual Activity  . Alcohol use: No  . Drug use: Yes    Types: Marijuana    Comment: 2-3 x/month  . Sexual activity: Yes  Other Topics Concern  . None  Social History Narrative   Lives in [place] with husband and 2 children.  Has older children that have moved out.  Does not routinely exercise.   Additional Social History: She lives at home with her husband and 3 children (74, 71 and 47 y/o). She is unemployed. She reports daily marijuana use. She has been smoking marijuana since 41 y/o. She denies tobacco or alcohol use.     Allergies:  No  Known Allergies  Labs:  Results for orders placed or performed during the hospital encounter of 12/21/16 (from the past 48 hour(s))  Glucose, capillary     Status: Abnormal   Collection Time: 12/22/16 11:12 AM  Result Value Ref Range   Glucose-Capillary 159 (H) 65 - 99 mg/dL  Glucose, capillary     Status: Abnormal   Collection Time: 12/22/16  4:23 PM  Result Value Ref Range   Glucose-Capillary 123 (H) 65 - 99 mg/dL  Glucose, capillary     Status: Abnormal   Collection Time: 12/22/16  9:42 PM  Result Value Ref Range   Glucose-Capillary 127 (H) 65 - 99 mg/dL  CBC with Differential/Platelet     Status: Abnormal   Collection Time: 12/23/16  4:14 AM  Result Value Ref Range   WBC 11.6 (H) 4.0 - 10.5 K/uL   RBC 4.42 3.87 - 5.11  MIL/uL   Hemoglobin 12.5 12.0 - 15.0 g/dL   HCT 38.7 36.0 - 46.0 %   MCV 87.6 78.0 - 100.0 fL   MCH 28.3 26.0 - 34.0 pg   MCHC 32.3 30.0 - 36.0 g/dL   RDW 14.4 11.5 - 15.5 %   Platelets 403 (H) 150 - 400 K/uL   Neutrophils Relative % 65 %   Neutro Abs 7.6 1.7 - 7.7 K/uL   Lymphocytes Relative 26 %   Lymphs Abs 3.0 0.7 - 4.0 K/uL   Monocytes Relative 6 %   Monocytes Absolute 0.7 0.1 - 1.0 K/uL   Eosinophils Relative 3 %   Eosinophils Absolute 0.3 0.0 - 0.7 K/uL   Basophils Relative 0 %   Basophils Absolute 0.0 0.0 - 0.1 K/uL  Comprehensive metabolic panel     Status: Abnormal   Collection Time: 12/23/16  4:14 AM  Result Value Ref Range   Sodium 135 135 - 145 mmol/L   Potassium 4.5 3.5 - 5.1 mmol/L   Chloride 98 (L) 101 - 111 mmol/L   CO2 29 22 - 32 mmol/L   Glucose, Bld 127 (H) 65 - 99 mg/dL   BUN 12 6 - 20 mg/dL   Creatinine, Ser 0.86 0.44 - 1.00 mg/dL   Calcium 9.0 8.9 - 10.3 mg/dL   Total Protein 7.4 6.5 - 8.1 g/dL   Albumin 3.7 3.5 - 5.0 g/dL   AST 16 15 - 41 U/L   ALT 13 (L) 14 - 54 U/L   Alkaline Phosphatase 92 38 - 126 U/L   Total Bilirubin 0.7 0.3 - 1.2 mg/dL   GFR calc non Af Amer >60 >60 mL/min   GFR calc Af Amer >60 >60 mL/min     Comment: (NOTE) The eGFR has been calculated using the CKD EPI equation. This calculation has not been validated in all clinical situations. eGFR's persistently <60 mL/min signify possible Chronic Kidney Disease.    Anion gap 8 5 - 15  Magnesium     Status: None   Collection Time: 12/23/16  4:14 AM  Result Value Ref Range   Magnesium 1.9 1.7 - 2.4 mg/dL  Phosphorus     Status: None   Collection Time: 12/23/16  4:14 AM  Result Value Ref Range   Phosphorus 3.8 2.5 - 4.6 mg/dL  Glucose, capillary     Status: Abnormal   Collection Time: 12/23/16  7:48 AM  Result Value Ref Range   Glucose-Capillary 175 (H) 65 - 99 mg/dL  Glucose, capillary     Status: Abnormal   Collection Time: 12/23/16 12:07 PM  Result Value Ref Range   Glucose-Capillary 108 (H) 65 - 99 mg/dL  Glucose, capillary     Status: Abnormal   Collection Time: 12/23/16  4:17 PM  Result Value Ref Range   Glucose-Capillary 117 (H) 65 - 99 mg/dL  Glucose, capillary     Status: Abnormal   Collection Time: 12/23/16  8:23 PM  Result Value Ref Range   Glucose-Capillary 115 (H) 65 - 99 mg/dL  Glucose, capillary     Status: Abnormal   Collection Time: 12/24/16  7:52 AM  Result Value Ref Range   Glucose-Capillary 118 (H) 65 - 99 mg/dL    Current Facility-Administered Medications  Medication Dose Route Frequency Provider Last Rate Last Dose  . acetaminophen (TYLENOL) tablet 650 mg  650 mg Oral Q4H PRN Sheikh, Georgina Quint Latif, DO      . aspirin EC tablet 325 mg  325 mg Oral  Daily Raiford Noble [place], [place]   325 mg at 12/24/16 5697  . atorvastatin (LIPITOR) tablet 40 mg  40 mg Oral q1800 Raiford Noble [place], DO   40 mg at 12/23/16 1710  . chlorthalidone (HYGROTON) tablet 50 mg  50 mg Oral Daily Mariel Aloe, MD   50 mg at 12/24/16 0850  . FLUoxetine (PROZAC) capsule 10 mg  10 mg Oral Daily Raiford Noble [place], DO   10 mg at 12/24/16 9480  . gi cocktail (Maalox,Lidocaine,Donnatal)  30 mL Oral QID PRN Raiford Noble Latif, DO      .  heparin injection 5,000 Units  5,000 Units Subcutaneous Q8H Raiford Noble [place], [place]   5,000 Units at 12/23/16 2100  . ibuprofen (ADVIL,MOTRIN) tablet 600 mg  600 mg Oral Q6H PRN Mariel Aloe, MD   600 mg at 12/24/16 0853  . insulin aspart (novoLOG) injection 0-9 Units  0-9 Units Subcutaneous TID WC Raiford Noble [place], DO   2 Units at 12/23/16 1655  . lisinopril (PRINIVIL,ZESTRIL) tablet 40 mg  40 mg Oral Daily Raiford Noble [place], DO   40 mg at 12/24/16 3748  . LORazepam (ATIVAN) injection 1 mg  1 mg Intravenous Daily PRN Mariel Aloe, MD   1 mg at 12/24/16 1056  . LORazepam (ATIVAN) tablet 1 mg  1 mg Oral BID Mariel Aloe, MD   1 mg at 12/24/16 0850  . nitroGLYCERIN (NITROSTAT) SL tablet 0.4 mg  0.4 mg Sublingual Q5 min PRN Raiford Noble Latif, DO   0.4 mg at 12/21/16 2124  . polyethylene glycol (MIRALAX / GLYCOLAX) packet 17 g  17 g Oral Daily Mariel Aloe, MD   17 g at 12/24/16 1057  . promethazine (PHENERGAN) tablet 25 mg  25 mg Oral Q6H PRN Mariel Aloe, MD   25 mg at 12/24/16 2707    Musculoskeletal: Strength & Muscle Tone: within normal limits Gait & Station: normal Patient leans: N/A  Psychiatric Specialty Exam: Physical Exam  Nursing note and vitals reviewed. Constitutional: She is oriented to person, place, and time. She appears well-developed and well-nourished.  HENT:  Head: Normocephalic and atraumatic.  Neck: Normal range of motion.  Respiratory: Effort normal.  Musculoskeletal: Normal range of motion.  Neurological: She is alert and oriented to person, place, and time.  Skin: No rash noted.  Psychiatric: Her behavior is normal. Judgment and thought content normal.    Review of Systems  Gastrointestinal: Negative for diarrhea and nausea.  Musculoskeletal: Negative for back pain and joint pain.  Psychiatric/Behavioral: Positive for substance abuse. Negative for depression, hallucinations and suicidal ideas. The patient is nervous/anxious and has insomnia.      Blood pressure (!) 132/99, pulse 94, temperature 98.1 F (36.7 C), temperature source Axillary, resp. rate 17, height '5\' 7"'  (1.702 m), weight 111.3 kg (245 lb 6.4 oz), last menstrual period 11/20/2016, SpO2 100 %.Body mass index is 38.44 kg/m.  General Appearance: Well Groomed, middle age, 69 female who is overweight and is lying in bed with a hospital gown. NAD.   Eye Contact:  Good  Speech:  Clear and Coherent and Normal Rate  Volume:  Normal  Mood:  Anxious  Affect:  Congruent  Thought Process:  Goal Directed and Linear  Orientation:  Full (Time, Place, and Person)  Thought Content:  Logical  Suicidal Thoughts:  No  Homicidal Thoughts:  No  Memory:  Immediate;   Good Recent;   Good Remote;   Good  Judgement:  Good  Insight:  Good  Psychomotor Activity:  Normal  Concentration:  Concentration: Good and Attention Span: Good  Recall:  Good  Fund of Knowledge:  Good  Language:  Good  Akathisia:  No  Handed:  Right  AIMS (if indicated):   N/A  Assets:  Communication Skills Desire for Improvement Financial Resources/Insurance Housing Social Support  ADL's:  Intact  Cognition:  WNL  Sleep:   Poor   Assessment:  Cassandra Greer is a 41 y.o. female who was admitted with chest pain thought to be attributed to severe anxiety. She reports a chronic history of anxiety and symptoms consistent with panic attacks. She also reports often worrying. She reports daily marijuana use which can likely exacerbate her anxiety. She was educated about the effects of marijuana and the possible benefits from sustaining from use. She may benefit from antidepressant therapy for anxiety. She has a family history of bipolar disorder so it will be important for her to monitor for signs of mania. She was informed to notify her family as well.   Treatment Plan Summary: -Recommend continuing Prozac 10 mg daily for anxiety. Her outpatient provider may titrate the dose accordingly to manage her  symptoms. -Recommend Ativan only for short term use until Prozac becomes effective for anxiety management. Ativan is not indicated for long term use to treat anxiety.  -Patient was educated about marijuana use and encouraged to sustain from use since it may exacerbate her anxiety. She was also provided education about bipolar disorder given the strong family history and recent antidepressant use.  -Patient informed about therapy options for anxiety such as CBT. She has an outpatient therapist that she can follow up with.  -Patient is psychiatrically cleared. Psychiatry will sign off on patient at this time. Please consult psychiatry again as needed.   Disposition: No evidence of imminent risk to self or others at present.   Patient does not meet criteria for psychiatric inpatient admission.  Faythe Dingwall, DO 12/24/2016 11:02 AM

## 2016-12-24 NOTE — Plan of Care (Signed)
Pt still c/o pain in chest and right arm. Still having numbness/weakness of right arm. PRN medications administered for pain and nausea w/ some relief. Will continue to monitor.

## 2016-12-24 NOTE — Progress Notes (Signed)
Called to room by pt, found pt to be anxious and crying wanting to go home, stated she was having a panic attack, helped pt do some relaxation exercises, notified MD, received or for PRN ativan and psyc eval, See MAR, will continue to encourage relaxation techniques, will continue to monitor closely.  Edward Qualia RN

## 2016-12-24 NOTE — Care Management Note (Signed)
Case Management Note  Patient Details  Name: Cassandra Greer MRN: [id number] Date of Birth: [birth year]  Subjective/Objective:                 Spoke w patient at the bedside, she states that she lives at home w her spouse and two kids. She goes to PCP Dr Tomma Lightning at [facility] in [place]. She also goes to [facility] and gets her psych meds through there. She is uninsured and may need assistance with other meds at time of DC.   Action/Plan:  CM will continue to follow for DC and Med needs.   Expected Discharge Date:                  Expected Discharge Plan:  Home/Self Care  In-House Referral:     Discharge planning Services  CM Consult  Post Acute Care Choice:    Choice offered to:     DME Arranged:    DME Agency:     HH Arranged:    HH Agency:     Status of Service:  In process, will continue to follow  If discussed at Long Length of Stay Meetings, dates discussed:    Additional Comments:  Carles Collet, RN 12/24/2016, 10:33 AM

## 2016-12-25 NOTE — Discharge Summary (Signed)
Physician Discharge Summary  Cassandra Greer DOB: 1975/05/10 DOA: 12/21/2016  PCP: Cassandra Bar, MD  Admit date: 12/21/2016 Discharge date: 12/25/2016  Admitted From: Home Disposition: Left against medical advice  Recommendations for Outpatient Follow-up:  1. Patient left against medical advice  Discharge Condition: Left AMA CODE STATUS: Full code   Brief/Interim Summary:  Admission HPI written by Cassandra Elbe, DO   Chief Complaint: Chest Pain and Pressure  HPI: Cassandra Greer is a 41 y.o. female with medical history significant of Anxiety, Panic Disorder, HTN, Hx of Colitis, and Hx of Headaches and other comorbids who presented to the ED with a CC of Chest Pain/Pressure that started approximately two days ago. Over the last few months she has been having intermittent Chest Pain that she has attributed to Anxiety Attacks. She developed sudden onset of Chest Pain 2 days ago that has been intermittent but pretty consistent and complained of 10/10 in Severity. Associated symptoms of Diaphoresis, Radiation across chest into the Right Arm, and Nausea. Patient also complained of Epigastric pain that was worsen with palpation. Patient states nothing mad the chest pain better so she came to the ED for evaluation. BP was high in the ED so she was given Analgesics with Dilaudid, Ketorolac, and GI Cocktail with minimal relief. Patient continued to have CP so TR[facility] was called to Admit for CP r/o. Cardiology was consulted as patient remained diaphoretic and nauseous.   ED Course: Had blood work done, given 3 mg of IV dilaudid, IV Ketorolac, GI Cocktail. CXR was Negative. EKG was done along with an U/S of the Abdomen.       Hospital course:  Chest pain Atypical. Cardiac workup negative to date. Echocardiogram with preserved function. Pain has been persistent and has not responded to any modality of treatment so far. Possibly secondary to anxiety. D-dimer wnl. PERC  score of 0. Chest pain not improved or worsened by anything. Patient left against medical advice.  Nausea Emesis improved with phenergan  Hypertensive urgency Blood pressure better controlled once started on chlorthalidone. Restarted home lisinopril. Patient did not receive prescriptions as she left against medical advice.  Diabetes mellitus New diagnosis. Hemoglobin A1C of 6.6% received teaching. Would recommend metformin and/or diet modification  Anxiety Likely contributing significantly to presentation. Started on Prozac however patient left against medical advice psychiatry consult was pending.  Right arm numbness Has progressed since admission. Now with some weakness. CT chest unremarkable. Neurology consulted and recommended MRI brain. Patient left prior to obtaining test.  Mild C5-6 disc bulge Mild C5-6 foraminal stenosis Although this could contribute to symptoms, symptoms appear out of proportion to lesions  RUQ tenderness Present on light touch. Has been present for almost one week. No rash to suggest shingles. Prescribed Voltaren gel while inpatient.    Discharge Diagnoses:  Principal Problem:   Generalized anxiety disorder with panic attacks Active Problems:   Leukocytosis   HTN (hypertension)   Chest pain   Accelerated hypertension   Hyperglycemia   Epigastric abdominal pain   Fatty liver disease, nonalcoholic   Thrombocytosis (HCC)   Panic disorder (episodic paroxysmal anxiety)   Type 2 diabetes mellitus with hyperlipidemia ([place])   Hyperlipidemia    Discharge Instructions   Allergies as of 12/24/2016   No Known Allergies     Medication List    ASK your doctor about these medications   gabapentin 300 MG capsule Commonly known as:  NEURONTIN Take 300 mg by mouth at bedtime.   hydrOXYzine  25 MG capsule Commonly known as:  VISTARIL Take 25 mg at bedtime as needed by mouth for anxiety.   lisinopril 20 MG tablet Commonly known as:   PRINIVIL,ZESTRIL Take 20 mg daily by mouth.   PROZAC 10 MG capsule Generic drug:  FLUoxetine Take 10 mg daily by mouth.       No Known Allergies  Consultations:  Neurology  Cardiology  Psychiatry   Procedures/Studies: Dg Chest 2 View  Result Date: 12/21/2016 CLINICAL DATA:  Chest pain. EXAM: CHEST  2 VIEW COMPARISON:  Chest x-ray dated November 04, 2015. FINDINGS: The cardiomediastinal silhouette is normal in size. Normal pulmonary vascularity. No focal consolidation, pleural effusion, or pneumothorax. No acute osseous abnormality. IMPRESSION: No active cardiopulmonary disease. Electronically Signed   By: Cassandra Greer M.D.   On: 12/21/2016 13:23   Ct Chest W Contrast  Result Date: 12/22/2016 CLINICAL DATA:  41 year old female with chest pain, shortness of breath, pleurisy, effusion, abdominal pain, nausea, vomiting. EXAM: CT CHEST, ABDOMEN, AND PELVIS WITH CONTRAST TECHNIQUE: Multidetector CT imaging of the chest, abdomen and pelvis was performed following the standard protocol during bolus administration of intravenous contrast. CONTRAST:  15mL ISOVUE-300 IOPAMIDOL (ISOVUE-300) INJECTION 61% COMPARISON:  None. CT Abdomen and Pelvis 04/24/2016. Chest radiographs 12/21/2016 and earlier. FINDINGS: CT CHEST FINDINGS Cardiovascular: Suggestion of some left ventricular hypertrophy. Otherwise no cardiomegaly. No pericardial effusion. Negative thoracic aorta and proximal great vessels. No definite calcified coronary artery atherosclerosis. Mediastinum/Nodes: Negative.  No lymphadenopathy. Lungs/Pleura: Major airways are patent. Both lungs are clear. No pleural effusion or pleural thickening. Borderline to mild pulmonary hyperinflation. Musculoskeletal: Interbody ankylosis of T9-T10 anteriorly. Otherwise thoracic osseous structures appear normal for age. No acute osseous abnormality identified. CT ABDOMEN PELVIS FINDINGS Hepatobiliary: Evidence of hepatic steatosis again noted. Contracted  gallbladder with no surrounding inflammation. Pancreas: Negative. Spleen: Negative. Adrenals/Urinary Tract: Normal adrenal glands. Bilateral renal enhancement and contrast excretion appears symmetric and normal. No perinephric stranding. No hydroureter. Stable small left hemipelvis phlebolith. Unremarkable urinary bladder. Stomach/Bowel: Negative rectum. Redundant but otherwise negative sigmoid colon. Negative left colon. Negative transverse colon aside from mild retained stool. Oral contrast has reached the hepatic flexure. Negative right colon. Normal retrocecal appendix. Negative terminal ileum. No dilated or abnormal small bowel. Negative stomach and duodenum. No abdominal free fluid. Vascular/Lymphatic: Major arterial structures are patent. Minimal iliac calcified atherosclerosis. Portal venous system appears patent. No lymphadenopathy. Reproductive: Mild left ovarian enlargement appears related to a 2.8 cm simple fluid density cyst which is likely physiologic. Negative uterus and right ovary. Other: Trace pelvic free fluid in the cul-de-sac. Probable small ventral abdominal injection site on the right series 3, image 91. Musculoskeletal: Normal for age lumbar spine. Stable mild sclerosis along the SI joints greater on the right. No acute osseous abnormality identified. IMPRESSION: 1. No acute or inflammatory process identified in the chest or abdomen. 2. No acute or inflammatory process identified in the pelvis; trace pelvic free fluid and a small left ovarian cyst are most likely physiologic. 3. Hepatic steatosis. Electronically Signed   By: Genevie Ann M.D.   On: 12/22/2016 16:47   Mr Cervical Spine Wo Contrast  Result Date: 12/23/2016 CLINICAL DATA:  Radiculopathy. Chest pressure wish shoulder and bilateral arm pain. Right arm tingling. EXAM: MRI CERVICAL SPINE WITHOUT CONTRAST TECHNIQUE: Multiplanar, multisequence MR imaging of the cervical spine was performed. No intravenous contrast was administered.  COMPARISON:  Cervical spine radiographs 06/19/2008 FINDINGS: Alignment: Mild cervical kyphosis.  No listhesis. Vertebrae: No fracture, suspicious osseous lesion, or  significant marrow edema. Cord: Normal signal and morphology. Posterior Fossa, vertebral arteries, paraspinal tissues: Bilateral perineural cysts in the lower cervical and upper thoracic neural foramina. Preserve vertebral artery flow voids. Unremarkable craniocervical junction. Disc levels: C2-3:  Mild bilateral facet arthrosis without stenosis. C3-4:  Negative. C4-5: Mild right uncovertebral spurring without significant stenosis. C5-6: Disc bulging, broad right paracentral disc osteophyte complex, and uncovertebral spurring result in mild right neural foraminal stenosis and a slight impression on the ventral spinal cord without significant spinal stenosis. C6-7:  Negative. C7-T1:  Negative. 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 No significant spinal stenosis. Electronically Signed   By: Logan Bores M.D.   On: 12/23/2016 11:28   Ct Abdomen Pelvis W Contrast  Result Date: 12/22/2016 CLINICAL DATA:  41 year old female with chest pain, shortness of breath, pleurisy, effusion, abdominal pain, nausea, vomiting. EXAM: CT CHEST, ABDOMEN, AND PELVIS WITH CONTRAST TECHNIQUE: Multidetector CT imaging of the chest, abdomen and pelvis was performed following the standard protocol during bolus administration of intravenous contrast. CONTRAST:  158mL ISOVUE-300 IOPAMIDOL (ISOVUE-300) INJECTION 61% COMPARISON:  None. CT Abdomen and Pelvis 04/24/2016. Chest radiographs 12/21/2016 and earlier. FINDINGS: CT CHEST FINDINGS Cardiovascular: Suggestion of some left ventricular hypertrophy. Otherwise no cardiomegaly. No pericardial effusion. Negative thoracic aorta and proximal great vessels. No definite calcified coronary artery atherosclerosis. Mediastinum/Nodes: Negative.  No lymphadenopathy.  Lungs/Pleura: Major airways are patent. Both lungs are clear. No pleural effusion or pleural thickening. Borderline to mild pulmonary hyperinflation. Musculoskeletal: Interbody ankylosis of T9-T10 anteriorly. Otherwise thoracic osseous structures appear normal for age. No acute osseous abnormality identified. CT ABDOMEN PELVIS FINDINGS Hepatobiliary: Evidence of hepatic steatosis again noted. Contracted gallbladder with no surrounding inflammation. Pancreas: Negative. Spleen: Negative. Adrenals/Urinary Tract: Normal adrenal glands. Bilateral renal enhancement and contrast excretion appears symmetric and normal. No perinephric stranding. No hydroureter. Stable small left hemipelvis phlebolith. Unremarkable urinary bladder. Stomach/Bowel: Negative rectum. Redundant but otherwise negative sigmoid colon. Negative left colon. Negative transverse colon aside from mild retained stool. Oral contrast has reached the hepatic flexure. Negative right colon. Normal retrocecal appendix. Negative terminal ileum. No dilated or abnormal small bowel. Negative stomach and duodenum. No abdominal free fluid. Vascular/Lymphatic: Major arterial structures are patent. Minimal iliac calcified atherosclerosis. Portal venous system appears patent. No lymphadenopathy. Reproductive: Mild left ovarian enlargement appears related to a 2.8 cm simple fluid density cyst which is likely physiologic. Negative uterus and right ovary. Other: Trace pelvic free fluid in the cul-de-sac. Probable small ventral abdominal injection site on the right series 3, image 91. Musculoskeletal: Normal for age lumbar spine. Stable mild sclerosis along the SI joints greater on the right. No acute osseous abnormality identified. IMPRESSION: 1. No acute or inflammatory process identified in the chest or abdomen. 2. No acute or inflammatory process identified in the pelvis; trace pelvic free fluid and a small left ovarian cyst are most likely physiologic. 3. Hepatic  steatosis. Electronically Signed   By: Genevie Ann M.D.   On: 12/22/2016 16:47   US Abdomen Limited Ruq  Result Date: 12/21/2016 CLINICAL DATA:  Right upper quadrant pain for 2 days EXAM: ULTRASOUND ABDOMEN LIMITED RIGHT UPPER QUADRANT COMPARISON:  04/24/2016 FINDINGS: Gallbladder: No gallstones or wall thickening visualized. No sonographic Murphy sign noted by sonographer. Common bile duct: Diameter: 2.9 mm Liver: Diffuse increased hepatic echogenicity. Portal vein is patent on color Doppler imaging with normal direction of blood flow towards the liver. IMPRESSION: 1. Negative for gallstones or biliary dilatation 2.  Diffuse increased hepatic echogenicity consistent with fatty infiltration. Electronically Signed   By: Donavan Foil M.D.   On: 12/21/2016 17:02      Echocardiogram (11/13) Study Conclusions  - Left ventricle: The cavity size was normal. There was mild concentric hypertrophy. Systolic function was normal. The estimated ejection fraction was in the range of 55% to 60%. Wall motion was normal; there were no regional wall motion abnormalities. Left ventricular diastolic function parameters were normal. - Aortic valve: Transvalvular velocity was within the normal range. There was no stenosis. There was no regurgitation. - Mitral valve: Transvalvular velocity was within the normal range. There was no evidence for stenosis. There was mild regurgitation. - Right ventricle: The cavity size was normal. Wall thickness was normal. - Tricuspid valve: There was trivial regurgitation. - Pulmonary arteries: Systolic pressure was within the normal range. PA peak pressure: 26 mm Hg (S). - Pericardium, extracardiac: A trivial pericardial effusion was identified.     Subjective: Patient left against medical advice.  Discharge Exam: Vitals:   12/24/16 1122 12/24/16 1500  BP: (!) 141/95 (!) 144/102  Pulse:  98  Resp: 19 13  Temp: 97.6 F (36.4 C) 97.8 F (36.6 C)   SpO2:  97%   Vitals:   12/24/16 0624 12/24/16 0806 12/24/16 1122 12/24/16 1500  BP: (!) 143/92 (!) 132/99 (!) 141/95 (!) 144/102  Pulse: 94   98  Resp: (!) 22 17 19 13   Temp: 97.8 F (36.6 C) 98.1 F (36.7 C) 97.6 F (36.4 C) 97.8 F (36.6 C)  TempSrc: Oral Axillary Oral Axillary  SpO2: 100%   97%  Weight: 111.3 kg (245 lb 6.4 oz)     Height:        Patient left against medical advice.   The results of significant diagnostics from this hospitalization (including imaging, microbiology, ancillary and laboratory) are listed below for reference.     Microbiology: Recent Results (from the past 240 hour(s))  MRSA PCR Screening     Status: None   Collection Time: 12/21/16  9:17 PM  Result Value Ref Range Status   MRSA by PCR NEGATIVE NEGATIVE Final    Comment:        The GeneXpert MRSA Assay (FDA approved for NASAL specimens only), is one component of a comprehensive MRSA colonization surveillance program. It is not intended to diagnose MRSA infection nor to guide or monitor treatment for MRSA infections.      Labs: BNP (last 3 results) Recent Labs    12/21/16 1556  BNP 63.3   Basic Metabolic Panel: Recent Labs  Lab 12/21/16 1305 12/21/16 1603 12/21/16 2118 12/22/16 0256 12/23/16 0414  NA 135  --   --  136 135  K 4.3  --   --  4.1 4.5  CL 100*  --   --  103 98*  CO2 24  --   --  24 29  GLUCOSE 165*  --   --  100* 127*  BUN 11  --   --  13 12  CREATININE 0.82  --  0.81 0.73 0.86  CALCIUM 9.4  --   --  8.6* 9.0  MG  --  1.8  --  1.8 1.9  PHOS  --  3.9  --  3.8 3.8   Liver Function Tests: Recent Labs  Lab 12/21/16 1414 12/22/16 0256 12/23/16 0414  AST 19 14* 16  ALT 12* 11* 13*  ALKPHOS 92 84 92  BILITOT 0.4 0.4 0.7  PROT 7.1  6.6 7.4  ALBUMIN 3.5 3.4* 3.7   Recent Labs  Lab 12/21/16 1414  LIPASE 32   No results for input(s): AMMONIA in the last 168 hours. CBC: Recent Labs  Lab 12/21/16 1305 12/21/16 2118 12/22/16 0256 12/23/16 0414   WBC 14.2* 15.2* 14.3* 11.6*  NEUTROABS  --   --  10.9* 7.6  HGB 12.7 12.5 11.4* 12.5  HCT 38.9 38.9 35.8* 38.7  MCV 86.8 87.2 88.0 87.6  PLT 409* 402* 400 403*   Cardiac Enzymes: Recent Labs  Lab 12/21/16 2118 12/22/16 0003 12/22/16 0256  TROPONINI <0.03 <0.03 <0.03   BNP: Invalid input(s): POCBNP CBG: Recent Labs  Lab 12/23/16 1617 12/23/16 2023 12/24/16 0752 12/24/16 1120 12/24/16 1530  GLUCAP 117* 115* 118* 123* 90   D-Dimer No results for input(s): DDIMER in the last 72 hours. Hgb A1c No results for input(s): HGBA1C in the last 72 hours. Lipid Profile No results for input(s): CHOL, HDL, LDLCALC, TRIG, CHOLHDL, LDLDIRECT in the last 72 hours. Thyroid function studies No results for input(s): TSH, T4TOTAL, T3FREE, THYROIDAB in the last 72 hours.  Invalid input(s): FREET3 Anemia work up No results for input(s): VITAMINB12, FOLATE, FERRITIN, TIBC, IRON, RETICCTPCT in the last 72 hours. Urinalysis    Component Value Date/Time   COLORURINE YELLOW 04/24/2016 1533   APPEARANCEUR HAZY (A) 04/24/2016 1533   LABSPEC 1.027 04/24/2016 1533   PHURINE 6.0 04/24/2016 1533   GLUCOSEU NEGATIVE 04/24/2016 1533   HGBUR LARGE (A) 04/24/2016 1533   BILIRUBINUR NEGATIVE 04/24/2016 1533   KETONESUR 5 (A) 04/24/2016 1533   PROTEINUR 100 (A) 04/24/2016 1533   UROBILINOGEN 1.0 01/09/2014 1806   NITRITE NEGATIVE 04/24/2016 1533   LEUKOCYTESUR MODERATE (A) 04/24/2016 1533   Sepsis Labs Invalid input(s): PROCALCITONIN,  WBC,  LACTICIDVEN Microbiology Recent Results (from the past 240 hour(s))  MRSA PCR Screening     Status: None   Collection Time: 12/21/16  9:17 PM  Result Value Ref Range Status   MRSA by PCR NEGATIVE NEGATIVE Final    Comment:        The GeneXpert MRSA Assay (FDA approved for NASAL specimens only), is one component of a comprehensive MRSA colonization surveillance program. It is not intended to diagnose MRSA infection nor to guide or monitor treatment  for MRSA infections.     SIGNED:   Cordelia Poche, MD [facility] 12/25/2016, 2:29 PM Pager [phone]  If 7PM-7AM, please contact night-coverage [email or web address] Password [email or web address]

## 2017-02-21 ENCOUNTER — Emergency Department (HOSPITAL_COMMUNITY): Payer: Self-pay

## 2017-02-21 ENCOUNTER — Other Ambulatory Visit: Payer: Self-pay

## 2017-02-21 ENCOUNTER — Encounter (HOSPITAL_COMMUNITY): Payer: Self-pay | Admitting: Emergency Medicine

## 2017-02-21 ENCOUNTER — Emergency Department (HOSPITAL_COMMUNITY)
Admission: EM | Admit: 2017-02-21 | Discharge: 2017-02-22 | Disposition: A | Discharge status: Home / Self Care | Payer: Self-pay | Attending: Emergency Medicine | Admitting: Emergency Medicine

## 2017-02-21 DIAGNOSIS — I10 Essential (primary) hypertension: Secondary | ICD-10-CM | POA: Insufficient documentation

## 2017-02-21 DIAGNOSIS — I159 Secondary hypertension, unspecified: Secondary | ICD-10-CM

## 2017-02-21 DIAGNOSIS — R1011 Right upper quadrant pain: Secondary | ICD-10-CM | POA: Insufficient documentation

## 2017-02-21 DIAGNOSIS — R079 Chest pain, unspecified: Secondary | ICD-10-CM

## 2017-02-21 DIAGNOSIS — R109 Unspecified abdominal pain: Secondary | ICD-10-CM

## 2017-02-21 DIAGNOSIS — Z79899 Other long term (current) drug therapy: Secondary | ICD-10-CM | POA: Insufficient documentation

## 2017-02-21 DIAGNOSIS — E119 Type 2 diabetes mellitus without complications: Secondary | ICD-10-CM | POA: Insufficient documentation

## 2017-02-21 DIAGNOSIS — M79601 Pain in right arm: Secondary | ICD-10-CM

## 2017-02-21 LAB — I-STAT TROPONIN, ED: Troponin i, poc: 0 ng/mL (ref 0.00–0.08)

## 2017-02-21 LAB — CBC
HCT: 39.6 % (ref 36.0–46.0)
Hemoglobin: 12.7 g/dL (ref 12.0–15.0)
MCH: 28.1 pg (ref 26.0–34.0)
MCHC: 32.1 g/dL (ref 30.0–36.0)
MCV: 87.6 fL (ref 78.0–100.0)
PLATELETS: 475 10*3/uL — AB (ref 150–400)
RBC: 4.52 MIL/uL (ref 3.87–5.11)
RDW: 13.6 % (ref 11.5–15.5)
WBC: 13.2 10*3/uL — ABNORMAL HIGH (ref 4.0–10.5)

## 2017-02-21 LAB — BASIC METABOLIC PANEL
Anion gap: 11 (ref 5–15)
BUN: 10 mg/dL (ref 6–20)
CO2: 24 mmol/L (ref 22–32)
CREATININE: 0.8 mg/dL (ref 0.44–1.00)
Calcium: 9.4 mg/dL (ref 8.9–10.3)
Chloride: 102 mmol/L (ref 101–111)
GFR calc Af Amer: >60 mL/min (ref >60)
GFR calc non Af Amer: >60 mL/min (ref >60)
GLUCOSE: 109 mg/dL — AB (ref 65–99)
Potassium: 4.2 mmol/L (ref 3.5–5.1)
Sodium: 137 mmol/L (ref 135–145)

## 2017-02-21 LAB — HEPATIC FUNCTION PANEL
ALT: 15 U/L (ref 14–54)
AST: 16 U/L (ref 15–41)
Albumin: 3.8 g/dL (ref 3.5–5.0)
Alkaline Phosphatase: 82 U/L (ref 38–126)
BILIRUBIN TOTAL: 0.5 mg/dL (ref 0.3–1.2)
Total Protein: 7.1 g/dL (ref 6.5–8.1)

## 2017-02-21 LAB — I-STAT BETA HCG BLOOD, ED (MC, WL, AP ONLY): I-stat hCG, quantitative: <5 m[IU]/mL (ref <5)

## 2017-02-21 LAB — LIPASE, BLOOD: LIPASE: 29 U/L (ref 11–51)

## 2017-02-21 MED ORDER — LORAZEPAM 1 MG PO TABS
1.0000 mg | ORAL_TABLET | Freq: Once | ORAL | Status: AC
  Administered 2017-02-21: 1 mg via ORAL
  Filled 2017-02-21: qty 1

## 2017-02-21 NOTE — ED Provider Notes (Signed)
[facility] EMERGENCY DEPARTMENT Provider Note   CSN: [id number] Arrival date & time: 02/21/17  2131     History   Chief Complaint Chief Complaint  Patient presents with  . Chest Pain    HPI Cassandra Greer is a 42 y.o. female.  HPI Cassandra Greer is a 42 y.o. female with history of hypertension, anxiety, marijuana abuse, diabetes, presents to emergency department complaining of chest pain and right hand tingling.  Patient states that she has had these symptoms for several months, states got worse in the last few days.  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  Patient did have leaving [place] at that time.  Next step was to get nerve conduction studies.  Patient states that her symptoms somewhat improved up until a couple days ago.  She now reports constant chest pain that is all across the chest, radiating into the back and down right arm again.  She reports shortness of breath.  She reports nausea.  She states eating makes pain worse.  She has not been able to follow-up for the symptoms.  Denies fever or chills.  No upper respiratory symptoms.  No swelling in extremities.  No recent travel or surgeries.  No other complaints  Past Medical History:  Diagnosis Date  . Chest pain   . Colitis   . Hypertension   . Marijuana abuse   . Morbid obesity ([place])   . Panic attacks   . Type 2 diabetes mellitus with hyperlipidemia ([place]) 12/22/2016    Patient Active Problem List   Diagnosis Date Noted  . Generalized anxiety disorder with panic attacks 12/24/2016  . Type 2 diabetes mellitus with  hyperlipidemia ([place]) 12/22/2016  . Hyperlipidemia 12/22/2016  . Chest pain 12/21/2016  . Accelerated hypertension 12/21/2016  . Hyperglycemia 12/21/2016  . Epigastric abdominal pain 12/21/2016  . Fatty liver disease, nonalcoholic 31/54/0086  . Thrombocytosis ([place]) 12/21/2016  . Panic disorder (episodic paroxysmal anxiety) 12/21/2016  . HTN (hypertension) 03/08/2013  . Hypokalemia 03/08/2013  . Headache(784.0) 03/08/2013  . Colitis 03/05/2013  . Leukocytosis 03/05/2013  . Sinusitis 03/05/2013    Past Surgical History:  Procedure Laterality Date  . TUBAL LIGATION      OB History    No data available       Home Medications    Prior to Admission medications   Medication Sig Start Date End Date Taking? Authorizing Provider  FLUoxetine (PROZAC) 10 MG capsule Take 10 mg daily by mouth.   Yes [provider]  lisinopril (PRINIVIL,ZESTRIL) 20 MG tablet Take 20 mg daily by mouth.   Yes [provider]  QUEtiapine (SEROQUEL) 50 MG tablet Take 50 mg by mouth at bedtime.   Yes [provider]    Family History Family History  Problem Relation Age of Onset  . Hypertension Other   . Diabetes Mellitus II Other   . Bipolar disorder Mother   . Other Father        unaware of his PMH    Social History Social History   Tobacco Use  . Smoking status: Never Smoker  . Smokeless tobacco: Never Used  Substance Use Topics  . Alcohol use:  No  . Drug use: Yes    Types: Marijuana    Comment: 2-3 x/month     Allergies   Patient has no known allergies.   Review of Systems Review of Systems  Constitutional: Negative for chills and fever.  Respiratory: Positive for chest tightness and shortness of breath. Negative for cough.   Cardiovascular: Positive for chest pain. Negative for palpitations and leg swelling.  Gastrointestinal: Positive for abdominal pain and nausea. Negative for diarrhea and vomiting.  Genitourinary: Negative for dysuria, flank pain  and pelvic pain.  Musculoskeletal: Negative for arthralgias, myalgias, neck pain and neck stiffness.  Skin: Negative for rash.  Neurological: Negative for dizziness, weakness and headaches.  All other systems reviewed and are negative.    Physical Exam Updated Vital Signs BP (!) 163/92   Pulse 94   Temp 98.3 F (36.8 C) (Oral)   Resp 15   Ht 6' (1.829 m)   Wt 113.4 kg (250 lb)   SpO2 98%   BMI 33.91 kg/m   Physical Exam  Constitutional: She appears well-developed and well-nourished.  Appears anxious  HENT:  Head: Normocephalic.  Eyes: Conjunctivae are normal.  Neck: Neck supple.  Cardiovascular: Normal rate, regular rhythm and normal heart sounds.  Pulmonary/Chest: Effort normal and breath sounds normal. No respiratory distress. She has no wheezes. She has no rales.  Abdominal: Soft. Bowel sounds are normal. There is tenderness. There is no rebound.  Right upper quadrant and epigastric tenderness  Musculoskeletal: She exhibits no edema.  Neurological: She is alert.  Skin: Skin is warm and dry.  Psychiatric:  Patient is very anxious, continuously taking deep breaths, hyperventilating, shaking her legs  Nursing note and vitals reviewed.    ED Treatments / Results  Labs (all labs ordered are listed, but only abnormal results are displayed) Labs Reviewed  BASIC METABOLIC PANEL - Abnormal; Notable for the following components:      Result Value   Glucose, Bld 109 (*)    All other components within normal limits  CBC - Abnormal; Notable for the following components:   WBC 13.2 (*)    Platelets 475 (*)    All other components within normal limits  HEPATIC FUNCTION PANEL  LIPASE, BLOOD  I-STAT TROPONIN, ED  I-STAT BETA HCG BLOOD, ED ([facility], [facility], [facility] ONLY)    EKG  EKG Interpretation  Date/Time:  Sunday February 21 2017 21:36:01 EST Ventricular Rate:  89 PR Interval:  182 QRS Duration: 88 QT Interval:  390 QTC Calculation: 474 R Axis:   -13 Text Interpretation:   Normal sinus rhythm Possible Left atrial enlargement Possible Anterior infarct , age undetermined Abnormal ECG SINCE LAST TRACING HEART RATE HAS INCREASED Confirmed by Davonna Belling [phone]) on 02/21/2017 9:56:10 PM       Radiology Dg Chest 2 View  Result Date: 02/21/2017 CLINICAL DATA:  42 year old female with chest pain. EXAM: CHEST  2 VIEW COMPARISON:  Chest CT dated 12/22/2016 FINDINGS: The heart size and mediastinal contours are within normal limits. Both lungs are clear. The visualized skeletal structures are unremarkable. IMPRESSION: No active cardiopulmonary disease. Electronically Signed   By: Anner Crete M.D.   On: 02/21/2017 22:23    Procedures Procedures (including critical care time)  Medications Ordered in ED Medications  LORazepam (ATIVAN) tablet 1 mg (not administered)     Initial Impression / Assessment and Plan / ED Course  I have reviewed the triage vital signs and the nursing notes.  Pertinent labs & imaging results that  were available during my care of the patient were reviewed by me and considered in my medical decision making (see chart for details).     Patient in emergency department with persistent chest pain that has been getting worse over the last few days, full workup for this pain excluding stress test month and a half ago while admitted to the hospital.  Her symptoms are atypical, chest pain is constant, at rest.  No risk factors for pulmonary embolism, negative d-dimer during last workup.  Patient is not tachycardic or hypoxic.  Not tachypneic.  She is very anxious, will give her Ativan to help with anxiety.  Patient is a chronic marijuana smoker, wondering if that could be worsening her anxiety at this time.  She is very tender in epigastric and right upper quadrant.  This could certainly be the cause of her nausea and right shoulder pain.  Will get right upper quadrant ultrasound.  We will repeat labs today.  1:07 AM Labs unremarkable other than  slightly elevated white blood cell count.  Vital signs are normal other than mild hypertension with blood pressure of 454U and 981 systolic.  Chest x-ray is negative.  Ultrasound today does show some nonspecific sludge with no definite cholelithiasis.  Her LFTs and lipase are normal.  She may benefit from outpatient HIDA scan.  I discussed this results with patient.  I do not think she needs a delta troponin given that her symptoms have been there constantly for more than a day.  I have low suspicion for PE with recent negative d-dimer and no risk factors for PE.  I have low suspicion for dissection, with symptoms for now several months.  I discussed with her close follow-up with neurology regarding her right arm tingling and pain, for possible EMG, and will have her follow-up with gastroenterology as well as primary care doctor.  I strongly encouraged her to call her psychiatrist and therapist as well.  She states that they are currently "messing around the medications and my medications are off." return precautions discussed.   Vitals:   02/21/17 2139 02/21/17 2142 02/21/17 2215 02/22/17 0045  BP: (!) 189/138  (!) 163/92 (!) 165/97  Pulse: 91  94 94  Resp: 20  15 17   Temp: 98.3 F (36.8 C)     TempSrc: Oral     SpO2: 95%  98% 98%  Weight:  113.4 kg (250 lb)    Height:  6' (1.829 m)       Final Clinical Impressions(s) / ED Diagnoses   Final diagnoses:  RUQ pain    ED Discharge Orders    None       Janee Morn 02/22/17 0116    Davonna Belling, MD 02/23/17 [phone]

## 2017-02-21 NOTE — ED Notes (Signed)
Pt appears very anxious, rocking back and forth sitting on bed

## 2017-02-21 NOTE — ED Triage Notes (Signed)
Pt c/o upper chest pressure/tightness onset 3 hours ago while watching TV, stabbing pain radiating down R arm with numbness/tingling. +shob, +nausea.  Pt states she has similar episode last year with no findings.

## 2017-02-21 NOTE — ED Notes (Signed)
Patient transported to X-ray 

## 2017-02-21 NOTE — ED Notes (Signed)
Pt to US via stretcher

## 2017-02-22 NOTE — Discharge Instructions (Signed)
Your test today did not show any explanation for your pain.  Your MRI for a month and a half ago does show that you have a mild disc disease in your neck which could potentially be causing your pain in your arm.  It is also possible that you could have carpal tunnel.  Please follow-up with either your family doctor or neurology for further evaluation and testing.  Take Tylenol or Motrin for pain.  Please follow-up with your family doctor regarding your chest pain and your abdominal pain, you can also follow-up with gastroenterology.  You may benefit for further studies on your gallbladder. Avoid any fatty foods.  Recheck BP by PCP in 1 week. Low salt diet. Return if any new concerning symptoms.

## 2017-02-22 NOTE — ED Notes (Signed)
Provider at bedside

## 2017-02-22 NOTE — ED Notes (Signed)
This RN in to d/c patient. Not noted to be gone from room. IV catheter found on bed with catheter intact on linen.

## 2018-02-19 DIAGNOSIS — F411 Generalized anxiety disorder: Secondary | ICD-10-CM | POA: Diagnosis not present

## 2018-02-19 DIAGNOSIS — E785 Hyperlipidemia, unspecified: Secondary | ICD-10-CM | POA: Insufficient documentation

## 2018-02-19 DIAGNOSIS — Z6841 Body Mass Index (BMI) 40.0 and over, adult: Secondary | ICD-10-CM | POA: Insufficient documentation

## 2018-02-19 DIAGNOSIS — Z23 Encounter for immunization: Secondary | ICD-10-CM | POA: Insufficient documentation

## 2018-02-19 DIAGNOSIS — Z79899 Other long term (current) drug therapy: Secondary | ICD-10-CM | POA: Insufficient documentation

## 2018-02-19 DIAGNOSIS — I10 Essential (primary) hypertension: Secondary | ICD-10-CM | POA: Diagnosis not present

## 2018-02-19 DIAGNOSIS — K760 Fatty (change of) liver, not elsewhere classified: Secondary | ICD-10-CM | POA: Insufficient documentation

## 2018-02-19 DIAGNOSIS — K8012 Calculus of gallbladder with acute and chronic cholecystitis without obstruction: Secondary | ICD-10-CM | POA: Diagnosis not present

## 2018-02-19 DIAGNOSIS — E1165 Type 2 diabetes mellitus with hyperglycemia: Secondary | ICD-10-CM | POA: Insufficient documentation

## 2018-02-19 DIAGNOSIS — K819 Cholecystitis, unspecified: Secondary | ICD-10-CM | POA: Diagnosis present

## 2018-02-19 DIAGNOSIS — E876 Hypokalemia: Secondary | ICD-10-CM | POA: Insufficient documentation

## 2018-02-20 ENCOUNTER — Other Ambulatory Visit: Payer: Self-pay

## 2018-02-20 ENCOUNTER — Encounter (HOSPITAL_COMMUNITY): Payer: Self-pay | Admitting: Emergency Medicine

## 2018-02-20 ENCOUNTER — Emergency Department (HOSPITAL_COMMUNITY): Payer: Medicaid Other

## 2018-02-20 ENCOUNTER — Observation Stay (HOSPITAL_COMMUNITY)
Admission: EM | Admit: 2018-02-20 | Discharge: 2018-02-22 | Disposition: A | Discharge status: Home / Self Care | Payer: Medicaid Other | Attending: Surgery | Admitting: Surgery

## 2018-02-20 DIAGNOSIS — Z419 Encounter for procedure for purposes other than remedying health state, unspecified: Secondary | ICD-10-CM

## 2018-02-20 DIAGNOSIS — K819 Cholecystitis, unspecified: Secondary | ICD-10-CM

## 2018-02-20 DIAGNOSIS — K8000 Calculus of gallbladder with acute cholecystitis without obstruction: Secondary | ICD-10-CM | POA: Diagnosis present

## 2018-02-20 LAB — TROPONIN I: Troponin I: <0.03 ng/mL (ref <0.03)

## 2018-02-20 LAB — CBC WITH DIFFERENTIAL/PLATELET
ABS IMMATURE GRANULOCYTES: 0.04 10*3/uL (ref 0.00–0.07)
Basophils Absolute: 0.1 10*3/uL (ref 0.0–0.1)
Basophils Relative: 1 %
EOS ABS: 0.5 10*3/uL (ref 0.0–0.5)
Eosinophils Relative: 4 %
HEMATOCRIT: 38 % (ref 36.0–46.0)
HEMOGLOBIN: 11.7 g/dL — AB (ref 12.0–15.0)
IMMATURE GRANULOCYTES: 0 %
LYMPHS ABS: 3.9 10*3/uL (ref 0.7–4.0)
Lymphocytes Relative: 30 %
MCH: 26.4 pg (ref 26.0–34.0)
MCHC: 30.8 g/dL (ref 30.0–36.0)
MCV: 85.8 fL (ref 80.0–100.0)
MONOS PCT: 6 %
Monocytes Absolute: 0.8 10*3/uL (ref 0.1–1.0)
NEUTROS PCT: 59 %
Neutro Abs: 7.7 10*3/uL (ref 1.7–7.7)
Platelets: 482 10*3/uL — ABNORMAL HIGH (ref 150–400)
RBC: 4.43 MIL/uL (ref 3.87–5.11)
RDW: 14.1 % (ref 11.5–15.5)
WBC: 13 10*3/uL — ABNORMAL HIGH (ref 4.0–10.5)
nRBC: 0 % (ref 0.0–0.2)

## 2018-02-20 LAB — COMPREHENSIVE METABOLIC PANEL
ALBUMIN: 3.5 g/dL (ref 3.5–5.0)
ALK PHOS: 71 U/L (ref 38–126)
ALT: 11 U/L (ref 0–44)
ANION GAP: 11 (ref 5–15)
AST: 15 U/L (ref 15–41)
BILIRUBIN TOTAL: 0.3 mg/dL (ref 0.3–1.2)
BUN: 10 mg/dL (ref 6–20)
CHLORIDE: 105 mmol/L (ref 98–111)
CO2: 25 mmol/L (ref 22–32)
Calcium: 9.3 mg/dL (ref 8.9–10.3)
Creatinine, Ser: 0.81 mg/dL (ref 0.44–1.00)
Glucose, Bld: 146 mg/dL — ABNORMAL HIGH (ref 70–99)
POTASSIUM: 4.2 mmol/L (ref 3.5–5.1)
Sodium: 141 mmol/L (ref 135–145)
Total Protein: 6.8 g/dL (ref 6.5–8.1)

## 2018-02-20 LAB — CBC
HCT: 33.8 % — ABNORMAL LOW (ref 36.0–46.0)
Hemoglobin: 10.1 g/dL — ABNORMAL LOW (ref 12.0–15.0)
MCH: 26.2 pg (ref 26.0–34.0)
MCHC: 29.9 g/dL — ABNORMAL LOW (ref 30.0–36.0)
MCV: 87.8 fL (ref 80.0–100.0)
Platelets: 384 10*3/uL (ref 150–400)
RBC: 3.85 MIL/uL — ABNORMAL LOW (ref 3.87–5.11)
RDW: 14.2 % (ref 11.5–15.5)
WBC: 10.2 10*3/uL (ref 4.0–10.5)
nRBC: 0 % (ref 0.0–0.2)

## 2018-02-20 LAB — HIV ANTIBODY (ROUTINE TESTING W REFLEX): HIV Screen 4th Generation wRfx: NONREACTIVE

## 2018-02-20 LAB — CREATININE, SERUM
Creatinine, Ser: 0.88 mg/dL (ref 0.44–1.00)
GFR calc non Af Amer: >60 mL/min (ref >60)

## 2018-02-20 LAB — SURGICAL PCR SCREEN
MRSA, PCR: NEGATIVE
Staphylococcus aureus: NEGATIVE

## 2018-02-20 MED ORDER — ALUM & MAG HYDROXIDE-SIMETH 200-200-20 MG/5ML PO SUSP
30.0000 mL | Freq: Once | ORAL | Status: DC
  Filled 2018-02-20: qty 30

## 2018-02-20 MED ORDER — QUETIAPINE FUMARATE 50 MG PO TABS
50.0000 mg | ORAL_TABLET | Freq: Every day | ORAL | Status: DC
  Administered 2018-02-20 – 2018-02-21 (×2): 50 mg via ORAL
  Filled 2018-02-20 (×2): qty 1

## 2018-02-20 MED ORDER — PROMETHAZINE HCL 25 MG/ML IJ SOLN
12.5000 mg | Freq: Once | INTRAMUSCULAR | Status: AC
  Administered 2018-02-20: 12.5 mg via INTRAVENOUS
  Filled 2018-02-20: qty 1

## 2018-02-20 MED ORDER — MORPHINE SULFATE (PF) 2 MG/ML IV SOLN
2.0000 mg | INTRAVENOUS | Status: DC | PRN
  Administered 2018-02-20: 2 mg via INTRAVENOUS
  Filled 2018-02-20: qty 1

## 2018-02-20 MED ORDER — DIPHENHYDRAMINE HCL 25 MG PO CAPS: 25.0000 mg | ORAL_CAPSULE | Freq: Four times a day (QID) | ORAL | Status: DC | PRN

## 2018-02-20 MED ORDER — POTASSIUM CHLORIDE IN NACL 20-0.9 MEQ/L-% IV SOLN
INTRAVENOUS | Status: DC
  Administered 2018-02-20 – 2018-02-21 (×4): via INTRAVENOUS
  Filled 2018-02-20 (×4): qty 1000

## 2018-02-20 MED ORDER — ONDANSETRON HCL 4 MG/2ML IJ SOLN
4.0000 mg | Freq: Four times a day (QID) | INTRAMUSCULAR | Status: DC | PRN
  Administered 2018-02-20 – 2018-02-21 (×3): 4 mg via INTRAVENOUS
  Filled 2018-02-20 (×2): qty 2

## 2018-02-20 MED ORDER — ACETAMINOPHEN 650 MG RE SUPP: 650.0000 mg | Freq: Four times a day (QID) | RECTAL | Status: DC | PRN

## 2018-02-20 MED ORDER — INFLUENZA VAC SPLIT QUAD 0.5 ML IM SUSY: 0.5000 mL | PREFILLED_SYRINGE | INTRAMUSCULAR | Status: DC

## 2018-02-20 MED ORDER — LIDOCAINE VISCOUS HCL 2 % MT SOLN
15.0000 mL | Freq: Once | OROMUCOSAL | Status: DC
  Filled 2018-02-20: qty 15

## 2018-02-20 MED ORDER — HYDROMORPHONE HCL 1 MG/ML IJ SOLN
0.5000 mg | Freq: Once | INTRAMUSCULAR | Status: AC
  Administered 2018-02-20: 0.5 mg via INTRAVENOUS
  Filled 2018-02-20: qty 1

## 2018-02-20 MED ORDER — DIPHENHYDRAMINE HCL 50 MG/ML IJ SOLN: 25.0000 mg | Freq: Four times a day (QID) | INTRAMUSCULAR | Status: DC | PRN

## 2018-02-20 MED ORDER — LISINOPRIL 20 MG PO TABS
20.0000 mg | ORAL_TABLET | Freq: Every day | ORAL | Status: DC
  Administered 2018-02-20 – 2018-02-22 (×3): 20 mg via ORAL
  Filled 2018-02-20 (×3): qty 1

## 2018-02-20 MED ORDER — SODIUM CHLORIDE 0.9 % IV SOLN
2.0000 g | INTRAVENOUS | Status: DC
  Administered 2018-02-20 – 2018-02-21 (×2): 2 g via INTRAVENOUS
  Filled 2018-02-20 (×2): qty 20

## 2018-02-20 MED ORDER — ONDANSETRON 4 MG PO TBDP
4.0000 mg | ORAL_TABLET | Freq: Once | ORAL | Status: AC
  Administered 2018-02-20: 4 mg via ORAL
  Filled 2018-02-20: qty 1

## 2018-02-20 MED ORDER — ACETAMINOPHEN 325 MG PO TABS
650.0000 mg | ORAL_TABLET | Freq: Four times a day (QID) | ORAL | Status: DC | PRN
  Administered 2018-02-21 – 2018-02-22 (×3): 650 mg via ORAL
  Filled 2018-02-20 (×3): qty 2

## 2018-02-20 MED ORDER — HYDROMORPHONE HCL 1 MG/ML IJ SOLN
1.0000 mg | INTRAMUSCULAR | Status: DC | PRN
  Administered 2018-02-20 (×4): 1 mg via INTRAVENOUS
  Filled 2018-02-20 (×4): qty 1

## 2018-02-20 MED ORDER — ONDANSETRON 4 MG PO TBDP: 4.0000 mg | ORAL_TABLET | Freq: Four times a day (QID) | ORAL | Status: DC | PRN

## 2018-02-20 MED ORDER — FLUOXETINE HCL 10 MG PO CAPS
10.0000 mg | ORAL_CAPSULE | Freq: Every day | ORAL | Status: DC
  Administered 2018-02-20 – 2018-02-22 (×3): 10 mg via ORAL
  Filled 2018-02-20 (×3): qty 1

## 2018-02-20 MED ORDER — ENOXAPARIN SODIUM 40 MG/0.4ML ~~LOC~~ SOLN
40.0000 mg | SUBCUTANEOUS | Status: DC
  Administered 2018-02-20 – 2018-02-22 (×2): 40 mg via SUBCUTANEOUS
  Filled 2018-02-20 (×2): qty 0.4

## 2018-02-20 NOTE — ED Notes (Signed)
Patient transported to Ultrasound 

## 2018-02-20 NOTE — ED Notes (Signed)
Pt reports blurred vision and headaches for the past couple of days. Shari-PA made aware

## 2018-02-20 NOTE — ED Notes (Signed)
Patient ambulated to the bathroom with steady gait noted.

## 2018-02-20 NOTE — H&P (Signed)
Cassandra Greer is an 43 y.o. female.   Chief Complaint: RUQ pain HPI: This is a 43 year old female who presents with approximately 18 months of intermittent RUQ/ R flank pain.  This is exacerbated by eating greasy, spicy foods.  Symptoms associated with nausea, vomiting, diarrhea, abdominal bloating.  She has had several ED visits and has been ruled out for cardiac disease.  This most recent episode began a few days ago.  Previous ultrasounds showed only some sludge in the GB and some fatty liver.  Past Medical History:  Diagnosis Date  . Chest pain   . Colitis   . Hypertension   . Marijuana abuse   . Morbid obesity ([place])   . Panic attacks   . Type 2 diabetes mellitus with hyperlipidemia ([place]) 12/22/2016    Past Surgical History:  Procedure Laterality Date  . TUBAL LIGATION      Family History  Problem Relation Age of Onset  . Hypertension Other   . Diabetes Mellitus II Other   . Bipolar disorder Mother   . Other Father        unaware of his PMH   Social History:  reports that she has never smoked. She has never used smokeless tobacco. She reports current drug use. Drug: Marijuana. She reports that she does not drink alcohol.  Allergies: No Known Allergies  Prior to Admission medications   Medication Sig Start Date End Date Taking? Authorizing Provider  FLUoxetine (PROZAC) 10 MG capsule Take 10 mg daily by mouth.    [provider]  lisinopril (PRINIVIL,ZESTRIL) 20 MG tablet Take 20 mg daily by mouth.    [provider]  QUEtiapine (SEROQUEL) 50 MG tablet Take 50 mg by mouth at bedtime.    [provider]    Results for orders placed or performed during the hospital encounter of 02/20/18 (from the past 48 hour(s))  CBC with Differential     Status: Abnormal   Collection Time: 02/20/18 12:23 AM  Result Value Ref Range   WBC 13.0 (H) 4.0 - 10.5 K/uL   RBC 4.43 3.87 - 5.11 MIL/uL   Hemoglobin 11.7 (L) 12.0 - 15.0 g/dL   HCT 38.0 36.0 - 46.0  %   MCV 85.8 80.0 - 100.0 fL   MCH 26.4 26.0 - 34.0 pg   MCHC 30.8 30.0 - 36.0 g/dL   RDW 14.1 11.5 - 15.5 %   Platelets 482 (H) 150 - 400 K/uL   nRBC 0.0 0.0 - 0.2 %   Neutrophils Relative % 59 %   Neutro Abs 7.7 1.7 - 7.7 K/uL   Lymphocytes Relative 30 %   Lymphs Abs 3.9 0.7 - 4.0 K/uL   Monocytes Relative 6 %   Monocytes Absolute 0.8 0.1 - 1.0 K/uL   Eosinophils Relative 4 %   Eosinophils Absolute 0.5 0.0 - 0.5 K/uL   Basophils Relative 1 %   Basophils Absolute 0.1 0.0 - 0.1 K/uL   Immature Granulocytes 0 %   Abs Immature Granulocytes 0.04 0.00 - 0.07 K/uL    Comment: Performed at [facility], [address], [place], [place] [address]  Troponin I - Once     Status: None   Collection Time: 02/20/18 12:23 AM  Result Value Ref Range   Troponin I <0.03 <0.03 ng/mL    Comment: Performed at [facility] [address] [place], [place] [address]  Comprehensive metabolic panel     Status: Abnormal   Collection  Time: 02/20/18 12:23 AM  Result Value Ref Range   Sodium 141 135 - 145 mmol/L   Potassium 4.2 3.5 - 5.1 mmol/L   Chloride 105 98 - 111 mmol/L   CO2 25 22 - 32 mmol/L   Glucose, Bld 146 (H) 70 - 99 mg/dL   BUN 10 6 - 20 mg/dL   Creatinine, Ser 0.81 0.44 - 1.00 mg/dL   Calcium 9.3 8.9 - 10.3 mg/dL   Total Protein 6.8 6.5 - 8.1 g/dL   Albumin 3.5 3.5 - 5.0 g/dL   AST 15 15 - 41 U/L   ALT 11 0 - 44 U/L   Alkaline Phosphatase 71 38 - 126 U/L   Total Bilirubin 0.3 0.3 - 1.2 mg/dL   GFR calc non Af Amer >60 >60 mL/min   GFR calc Af Amer >60 >60 mL/min   Anion gap 11 5 - 15    Comment: Performed at [facility], [address] [place], [place] [address]   Dg Chest 2 View  Result Date: 02/20/2018 CLINICAL DATA:  Left chest pain radiating to the central chest and left shoulder since yesterday. Shortness of breath worse on exertion. Nausea. Cough. Double vision and seeing great spots. EXAM: CHEST - 2 VIEW COMPARISON:  03/11/2017 FINDINGS: The heart size  and mediastinal contours are within normal limits. Both lungs are clear. The visualized skeletal structures are unremarkable. IMPRESSION: No active cardiopulmonary disease. Electronically Signed   By: Lucienne Capers M.D.   On: 02/20/2018 01:52   US Abdomen Limited  Result Date: 02/20/2018 CLINICAL DATA:  Right upper quadrant pain. EXAM: ULTRASOUND ABDOMEN LIMITED RIGHT UPPER QUADRANT COMPARISON:  02/21/2017 FINDINGS: Gallbladder: Gallbladder is contracted with stones and sludge visible. Murphy's sign is positive. Mild gallbladder wall thickening. Common bile duct: Diameter: 2.4 mm, normal Liver: Increased parenchymal echotexture suggesting diffuse fatty infiltration. No focal lesions identified. Portal vein is patent on color Doppler imaging with normal direction of blood flow towards the liver. IMPRESSION: Contracted gallbladder with cholelithiasis and positive Murphy's sign. Changes are consistent with acute cholecystitis in the appropriate clinical setting. Electronically Signed   By: Lucienne Capers M.D.   On: 02/20/2018 01:45    Review of Systems  Constitutional: Negative for weight loss.  HENT: Negative for ear discharge, ear pain, hearing loss and tinnitus.   Eyes: Negative for blurred vision, double vision, photophobia and pain.  Respiratory: Negative for cough, sputum production and shortness of breath.   Cardiovascular: Negative for chest pain.  Gastrointestinal: Positive for abdominal pain, diarrhea, nausea and vomiting.  Genitourinary: Positive for flank pain. Negative for dysuria, frequency and urgency.  Musculoskeletal: Negative for back pain, falls, joint pain, myalgias and neck pain.  Neurological: Negative for dizziness, tingling, sensory change, focal weakness, loss of consciousness and headaches.  Endo/Heme/Allergies: Does not bruise/bleed easily.  Psychiatric/Behavioral: Negative for depression, memory loss and substance abuse. The patient is not nervous/anxious.     Blood  pressure (!) 142/74, pulse 95, temperature 98.9 F (37.2 C), temperature source Oral, resp. rate (!) 31, last menstrual period 01/27/2018, SpO2 97 %. Physical Exam  WDWN in NAD Eyes:  Pupils equal, round; sclera anicteric HENT:  Oral mucosa moist; good dentition  Neck:  No masses palpated, no thyromegaly Lungs:  CTA bilaterally; normal respiratory effort CV:  Regular rate and rhythm; no murmurs; extremities well-perfused with no edema Abd:  +bowel sounds, obese, soft, tender in RUQ and epigastrium, no palpable organomegaly; no palpable hernias Skin:  Warm, dry; no sign  of jaundice Psychiatric - alert and oriented x 4; calm mood and affect  Assessment/Plan Acute cholecystitis - likely secondary to GB sludge Patient has been drinking liquids - including during my examination.  Admit for IV antibiotics Plan laparoscopic cholecystectomy with IOC this admission.  The surgical procedure has been discussed with the patient.  Potential risks, benefits, alternative treatments, and expected outcomes have been explained.  All of the patient's questions at this time have been answered.  The likelihood of reaching the patient's treatment goal is good.  The patient understand the proposed surgical procedure and wishes to proceed.   Maia Petties, MD 02/20/2018, 4:26 AM

## 2018-02-20 NOTE — ED Provider Notes (Signed)
[facility] EMERGENCY DEPARTMENT Provider Note   CSN: [id number] Arrival date & time: 02/19/18  2358     History   Chief Complaint Chief Complaint  Patient presents with  . Chest Pain    HPI Cassandra Greer is a 43 y.o. female.  Patient with a history of diet controlled DM, HTN presents with bilateral, anterior chest pain radiating to shoulders and into right abdomen. The pain started yesterday and is associated with SOB and nausea. No aggravating or alleviating factors, including eating/drinking, position, activity/rest. No cough or fever. She has experienced similar pain in the past but reports no diagnosis was made at that time. On chart review, the patient was seen one year ago for chest pain with an extensive workup including CT chest, abdomen, pelvis, echocardiogram, multiple serial troponins, negative d-dimer, had a cardiology consult, had a neurologist consult, MRI of cervical spine with no significant abnormalities. She also had an abdominal US that showed gall bladder sludge without convincing evidence of cholelithiasis/cholecystitis.   The history is provided by the patient. No language interpreter was used.    Past Medical History:  Diagnosis Date  . Chest pain   . Colitis   . Hypertension   . Marijuana abuse   . Morbid obesity ([place])   . Panic attacks   . Type 2 diabetes mellitus with hyperlipidemia ([place]) 12/22/2016    Patient Active Problem List   Diagnosis Date Noted  . Generalized anxiety disorder with panic attacks 12/24/2016  . Type 2 diabetes mellitus with hyperlipidemia ([place]) 12/22/2016  . Hyperlipidemia 12/22/2016  . Chest pain 12/21/2016  . Accelerated hypertension 12/21/2016  . Hyperglycemia 12/21/2016  . Epigastric abdominal pain 12/21/2016  . Fatty liver disease, nonalcoholic 62/22/9798  . Thrombocytosis ([place]) 12/21/2016  . Panic disorder (episodic paroxysmal anxiety) 12/21/2016  . HTN (hypertension) 03/08/2013  .  Hypokalemia 03/08/2013  . Headache(784.0) 03/08/2013  . Colitis 03/05/2013  . Leukocytosis 03/05/2013  . Sinusitis 03/05/2013    Past Surgical History:  Procedure Laterality Date  . TUBAL LIGATION       OB History   No obstetric history on file.      Home Medications    Prior to Admission medications   Medication Sig Start Date End Date Taking? Authorizing Provider  FLUoxetine (PROZAC) 10 MG capsule Take 10 mg daily by mouth.    [provider]  lisinopril (PRINIVIL,ZESTRIL) 20 MG tablet Take 20 mg daily by mouth.    [provider]  QUEtiapine (SEROQUEL) 50 MG tablet Take 50 mg by mouth at bedtime.    [provider]    Family History Family History  Problem Relation Age of Onset  . Hypertension Other   . Diabetes Mellitus II Other   . Bipolar disorder Mother   . Other Father        unaware of his PMH    Social History Social History   Tobacco Use  . Smoking status: Never Smoker  . Smokeless tobacco: Never Used  Substance Use Topics  . Alcohol use: No  . Drug use: Yes    Types: Marijuana    Comment: 2-3 x/month     Allergies   Patient has no known allergies.   Review of Systems Review of Systems  Constitutional: Negative for chills and fever.  HENT: Negative.   Respiratory: Positive for shortness of breath. Negative for cough.   Cardiovascular: Positive for chest pain.  Gastrointestinal: Positive for abdominal pain and nausea.  Genitourinary: Negative.  Musculoskeletal: Negative.   Skin: Negative.      Physical Exam Updated Vital Signs Pulse 88   Temp 98.9 F (37.2 C) (Oral)   Resp (!) 26   LMP 01/27/2018   Physical Exam Constitutional:      Appearance: She is well-developed. She is not toxic-appearing.     Comments: Uncomfortable appearing.  HENT:     Head: Normocephalic.  Neck:     Musculoskeletal: Normal range of motion and neck supple.  Cardiovascular:     Rate and Rhythm: Normal rate and regular  rhythm.     Heart sounds: No murmur.  Pulmonary:     Effort: Pulmonary effort is normal.     Breath sounds: Normal breath sounds. No wheezing, rhonchi or rales.  Chest:     Chest wall: Tenderness (anterior chest wall tender to palpation.) present.  Abdominal:     General: Bowel sounds are normal.     Palpations: Abdomen is soft.     Tenderness: There is abdominal tenderness (Tender to RUQ abdomen). There is no guarding or rebound.  Musculoskeletal: Normal range of motion.  Skin:    General: Skin is warm and dry.     Findings: No rash.  Neurological:     Mental Status: She is alert and oriented to person, place, and time.      ED Treatments / Results  Labs (all labs ordered are listed, but only abnormal results are displayed) Labs Reviewed  CBC WITH DIFFERENTIAL/PLATELET - Abnormal; Notable for the following components:      Result Value   WBC 13.0 (*)    Hemoglobin 11.7 (*)    Platelets 482 (*)    All other components within normal limits  TROPONIN I  COMPREHENSIVE METABOLIC PANEL   Results for orders placed or performed during the hospital encounter of 02/20/18  CBC with Differential  Result Value Ref Range   WBC 13.0 (H) 4.0 - 10.5 K/uL   RBC 4.43 3.87 - 5.11 MIL/uL   Hemoglobin 11.7 (L) 12.0 - 15.0 g/dL   HCT 38.0 36.0 - 46.0 %   MCV 85.8 80.0 - 100.0 fL   MCH 26.4 26.0 - 34.0 pg   MCHC 30.8 30.0 - 36.0 g/dL   RDW 14.1 11.5 - 15.5 %   Platelets 482 (H) 150 - 400 K/uL   nRBC 0.0 0.0 - 0.2 %   Neutrophils Relative % 59 %   Neutro Abs 7.7 1.7 - 7.7 K/uL   Lymphocytes Relative 30 %   Lymphs Abs 3.9 0.7 - 4.0 K/uL   Monocytes Relative 6 %   Monocytes Absolute 0.8 0.1 - 1.0 K/uL   Eosinophils Relative 4 %   Eosinophils Absolute 0.5 0.0 - 0.5 K/uL   Basophils Relative 1 %   Basophils Absolute 0.1 0.0 - 0.1 K/uL   Immature Granulocytes 0 %   Abs Immature Granulocytes 0.04 0.00 - 0.07 K/uL  Troponin I - Once  Result Value Ref Range   Troponin I <0.03 <0.03 ng/mL   Comprehensive metabolic panel  Result Value Ref Range   Sodium 141 135 - 145 mmol/L   Potassium 4.2 3.5 - 5.1 mmol/L   Chloride 105 98 - 111 mmol/L   CO2 25 22 - 32 mmol/L   Glucose, Bld 146 (H) 70 - 99 mg/dL   BUN 10 6 - 20 mg/dL   Creatinine, Ser 0.81 0.44 - 1.00 mg/dL   Calcium 9.3 8.9 - 10.3 mg/dL   Total Protein 6.8 6.5 -  8.1 g/dL   Albumin 3.5 3.5 - 5.0 g/dL   AST 15 15 - 41 U/L   ALT 11 0 - 44 U/L   Alkaline Phosphatase 71 38 - 126 U/L   Total Bilirubin 0.3 0.3 - 1.2 mg/dL   GFR calc non Af Amer >60 >60 mL/min   GFR calc Af Amer >60 >60 mL/min   Anion gap 11 5 - 15    EKG EKG Interpretation  Date/Time:  Sunday February 20 2018 00:09:12 EST Ventricular Rate:  90 PR Interval:    QRS Duration: 94 QT Interval:  393 QTC Calculation: 481 R Axis:   4 Text Interpretation:  Sinus rhythm Atrial premature complexes No significant change since last tracing Confirmed by Pryor Curia [phone]) on 02/20/2018 12:14:19 AM   Radiology No results found.  Procedures Procedures (including critical care time)  Medications Ordered in ED Medications  alum & mag hydroxide-simeth (MAALOX/MYLANTA) 200-200-20 MG/5ML suspension 30 mL (has no administration in time range)    And  lidocaine (XYLOCAINE) 2 % viscous mouth solution 15 mL (has no administration in time range)     Initial Impression / Assessment and Plan / ED Course  I have reviewed the triage vital signs and the nursing notes.  Pertinent labs & imaging results that were available during my care of the patient were reviewed by me and considered in my medical decision making (see chart for details).     Patient to ED with c/o chest pain across anterior chest into shoulders and right side abdomen. She denies fevers, cough but reports SOB. No modifying factors.   Chest tender on palpation, normal troponin, non-acute EKG. Doubt ACS. She is significantly tender to RUQ abdomen. [place] ordered as this could potentially cause  radiating pain to shoulder/chest. No elevated liver functions. She does have a mild leukocytosis.   RUQ US show cholelithiasis and +Murphy's. Given tenderness on exam, 13 WBC's, nausea/vomiting, feel she clinically has cholecystitis.   Discussed with Dr. Gershon Crane (Gen Surg) who will see the patient in evaluation for admission.   Final Clinical Impressions(s) / ED Diagnoses   Final diagnoses:  None   1. Acute cholecystitis  ED Discharge Orders    None       Charlann Lange, PA-C 02/20/18 [place], Delice Bison, DO 02/20/18 [phone]

## 2018-02-20 NOTE — ED Notes (Signed)
Pt had 2 episodes of emesis after administration of PO zofran, Shari, PA made aware

## 2018-02-20 NOTE — Progress Notes (Signed)
   Subjective/Chief Complaint: Still having RUQ pain. Drinking a lot of water   Objective: Vital signs in last 24 hours: Temp:  [97.9 F (36.6 C)-98.9 F (37.2 C)] 97.9 F (36.6 C) (01/12 0507) Pulse Rate:  [79-95] 80 (01/12 0507) Resp:  [15-31] 18 (01/12 0507) BP: (142-164)/(74-111) 164/103 (01/12 0507) SpO2:  [96 %-100 %] 100 % (01/12 0507) Weight:  [110.2 kg] 110.2 kg (01/12 0507)    Intake/Output from previous day: No intake/output data recorded. Intake/Output this shift: No intake/output data recorded.  General appearance: alert and cooperative Resp: clear to auscultation bilaterally Cardio: regular rate and rhythm GI: soft, mod RUQ tenderness  Lab Results:  Recent Labs    02/20/18 0023 02/20/18 0443  WBC 13.0* 10.2  HGB 11.7* 10.1*  HCT 38.0 33.8*  PLT 482* 384   BMET Recent Labs    02/20/18 0023 02/20/18 0443  NA 141  --   K 4.2  --   CL 105  --   CO2 25  --   GLUCOSE 146*  --   BUN 10  --   CREATININE 0.81 0.88  CALCIUM 9.3  --    PT/INR No results for input(s): LABPROT, INR in the last 72 hours. ABG No results for input(s): PHART, HCO3 in the last 72 hours.  Invalid input(s): PCO2, PO2  Studies/Results: Dg Chest 2 View  Result Date: 02/20/2018 CLINICAL DATA:  Left chest pain radiating to the central chest and left shoulder since yesterday. Shortness of breath worse on exertion. Nausea. Cough. Double vision and seeing great spots. EXAM: CHEST - 2 VIEW COMPARISON:  03/11/2017 FINDINGS: The heart size and mediastinal contours are within normal limits. Both lungs are clear. The visualized skeletal structures are unremarkable. IMPRESSION: No active cardiopulmonary disease. Electronically Signed   By: Lucienne Capers M.D.   On: 02/20/2018 01:52   US Abdomen Limited  Result Date: 02/20/2018 CLINICAL DATA:  Right upper quadrant pain. EXAM: ULTRASOUND ABDOMEN LIMITED RIGHT UPPER QUADRANT COMPARISON:  02/21/2017 FINDINGS: Gallbladder: Gallbladder is  contracted with stones and sludge visible. Murphy's sign is positive. Mild gallbladder wall thickening. Common bile duct: Diameter: 2.4 mm, normal Liver: Increased parenchymal echotexture suggesting diffuse fatty infiltration. No focal lesions identified. Portal vein is patent on color Doppler imaging with normal direction of blood flow towards the liver. IMPRESSION: Contracted gallbladder with cholelithiasis and positive Murphy's sign. Changes are consistent with acute cholecystitis in the appropriate clinical setting. Electronically Signed   By: Lucienne Capers M.D.   On: 02/20/2018 01:45    Anti-infectives: Anti-infectives (From admission, onward)   Start     Dose/Rate Route Frequency Ordered Stop   02/20/18 0500  cefTRIAXone (ROCEPHIN) 2 g in sodium chloride 0.9 % 100 mL IVPB     2 g 200 mL/hr over 30 Minutes Intravenous Every 24 hours 02/20/18 0434        Assessment/Plan: s/p * No surgery found * allow clears today. npo after midnight Plan for lap chole tomorrow  LOS: 0 days    Autumn Messing III 02/20/2018

## 2018-02-20 NOTE — Progress Notes (Signed)
Received report from [facility] in ED. Patient on way up to unit. Currently hypertensive, patient has history of HTN Dr Georgette Dover aware.

## 2018-02-20 NOTE — ED Triage Notes (Addendum)
Pt reports L side CP radiating to central chest and L shoulder since yesterday. Pt reports pain to her R side. Pt reports SHOB, worsening on exertion. Pt reports nausea with no vomiting. Pt reports non-productive cough. Pt reports double vision and seeing gray spots everywhere

## 2018-02-21 ENCOUNTER — Encounter (HOSPITAL_COMMUNITY): Payer: Self-pay | Admitting: Anesthesiology

## 2018-02-21 ENCOUNTER — Encounter (HOSPITAL_COMMUNITY)
Admission: EM | Disposition: A | Discharge status: Home / Self Care | Payer: Self-pay | Source: Home / Self Care | Attending: Emergency Medicine

## 2018-02-21 ENCOUNTER — Observation Stay (HOSPITAL_COMMUNITY): Payer: Medicaid Other

## 2018-02-21 ENCOUNTER — Observation Stay (HOSPITAL_COMMUNITY): Payer: Medicaid Other | Admitting: Anesthesiology

## 2018-02-21 DIAGNOSIS — E1165 Type 2 diabetes mellitus with hyperglycemia: Secondary | ICD-10-CM | POA: Diagnosis not present

## 2018-02-21 DIAGNOSIS — K8012 Calculus of gallbladder with acute and chronic cholecystitis without obstruction: Secondary | ICD-10-CM | POA: Diagnosis not present

## 2018-02-21 DIAGNOSIS — I10 Essential (primary) hypertension: Secondary | ICD-10-CM | POA: Diagnosis not present

## 2018-02-21 DIAGNOSIS — Z23 Encounter for immunization: Secondary | ICD-10-CM | POA: Diagnosis not present

## 2018-02-21 HISTORY — PX: CHOLECYSTECTOMY: SHX55

## 2018-02-21 LAB — GLUCOSE, CAPILLARY
Glucose-Capillary: 104 mg/dL — ABNORMAL HIGH (ref 70–99)
Glucose-Capillary: 148 mg/dL — ABNORMAL HIGH (ref 70–99)

## 2018-02-21 SURGERY — LAPAROSCOPIC CHOLECYSTECTOMY WITH INTRAOPERATIVE CHOLANGIOGRAM
Anesthesia: General | Site: Abdomen

## 2018-02-21 MED ORDER — FENTANYL CITRATE (PF) 100 MCG/2ML IJ SOLN
25.0000 ug | INTRAMUSCULAR | Status: DC | PRN
  Administered 2018-02-21 (×2): 25 ug via INTRAVENOUS

## 2018-02-21 MED ORDER — MIDAZOLAM HCL 2 MG/2ML IJ SOLN
INTRAMUSCULAR | Status: AC
  Filled 2018-02-21: qty 2

## 2018-02-21 MED ORDER — BUPIVACAINE-EPINEPHRINE 0.25% -1:200000 IJ SOLN
INTRAMUSCULAR | Status: DC | PRN
  Administered 2018-02-21: 20 mL

## 2018-02-21 MED ORDER — KETOROLAC TROMETHAMINE 30 MG/ML IJ SOLN
30.0000 mg | Freq: Once | INTRAMUSCULAR | Status: AC
  Administered 2018-02-21: 30 mg via INTRAVENOUS

## 2018-02-21 MED ORDER — ONDANSETRON HCL 4 MG/2ML IJ SOLN
INTRAMUSCULAR | Status: AC
  Filled 2018-02-21: qty 2

## 2018-02-21 MED ORDER — 0.9 % SODIUM CHLORIDE (POUR BTL) OPTIME
TOPICAL | Status: DC | PRN
  Administered 2018-02-21: 1000 mL

## 2018-02-21 MED ORDER — ACETAMINOPHEN 10 MG/ML IV SOLN
INTRAVENOUS | Status: AC
  Administered 2018-02-21: 1000 mg via INTRAVENOUS
  Filled 2018-02-21: qty 100

## 2018-02-21 MED ORDER — PROPOFOL 10 MG/ML IV BOLUS
INTRAVENOUS | Status: AC
  Filled 2018-02-21: qty 20

## 2018-02-21 MED ORDER — SODIUM CHLORIDE 0.9 % IV SOLN
INTRAVENOUS | Status: DC | PRN
  Administered 2018-02-21: 100 mL

## 2018-02-21 MED ORDER — OXYCODONE HCL 5 MG PO TABS: 5.0000 mg | ORAL_TABLET | Freq: Once | ORAL | Status: DC | PRN

## 2018-02-21 MED ORDER — PROPOFOL 10 MG/ML IV BOLUS
INTRAVENOUS | Status: DC | PRN
  Administered 2018-02-21: 200 mg via INTRAVENOUS

## 2018-02-21 MED ORDER — SUGAMMADEX SODIUM 200 MG/2ML IV SOLN
INTRAVENOUS | Status: DC | PRN
  Administered 2018-02-21: 200 mg via INTRAVENOUS

## 2018-02-21 MED ORDER — OXYCODONE HCL 5 MG PO TABS
5.0000 mg | ORAL_TABLET | ORAL | Status: DC | PRN
  Administered 2018-02-21 (×2): 10 mg via ORAL
  Administered 2018-02-22 (×4): 5 mg via ORAL
  Filled 2018-02-21: qty 1
  Filled 2018-02-21: qty 2
  Filled 2018-02-21 (×2): qty 1
  Filled 2018-02-21: qty 2
  Filled 2018-02-21: qty 1

## 2018-02-21 MED ORDER — FENTANYL CITRATE (PF) 100 MCG/2ML IJ SOLN
INTRAMUSCULAR | Status: DC | PRN
  Administered 2018-02-21: 100 ug via INTRAVENOUS
  Administered 2018-02-21 (×3): 50 ug via INTRAVENOUS

## 2018-02-21 MED ORDER — ROCURONIUM BROMIDE 50 MG/5ML IV SOSY
PREFILLED_SYRINGE | INTRAVENOUS | Status: DC | PRN
  Administered 2018-02-21: 50 mg via INTRAVENOUS
  Administered 2018-02-21: 15 mg via INTRAVENOUS

## 2018-02-21 MED ORDER — FENTANYL CITRATE (PF) 100 MCG/2ML IJ SOLN
INTRAMUSCULAR | Status: AC
  Administered 2018-02-21: 25 ug via INTRAVENOUS
  Filled 2018-02-21: qty 2

## 2018-02-21 MED ORDER — BUPIVACAINE-EPINEPHRINE (PF) 0.25% -1:200000 IJ SOLN
INTRAMUSCULAR | Status: AC
  Filled 2018-02-21: qty 30

## 2018-02-21 MED ORDER — IOPAMIDOL (ISOVUE-300) INJECTION 61%
INTRAVENOUS | Status: AC
  Filled 2018-02-21: qty 50

## 2018-02-21 MED ORDER — LACTATED RINGERS IV SOLN
INTRAVENOUS | Status: DC | PRN
  Administered 2018-02-21: 07:00:00 via INTRAVENOUS

## 2018-02-21 MED ORDER — MIDAZOLAM HCL 5 MG/5ML IJ SOLN
INTRAMUSCULAR | Status: DC | PRN
  Administered 2018-02-21: 2 mg via INTRAVENOUS

## 2018-02-21 MED ORDER — KETOROLAC TROMETHAMINE 30 MG/ML IJ SOLN
INTRAMUSCULAR | Status: AC
  Filled 2018-02-21: qty 1

## 2018-02-21 MED ORDER — OXYCODONE HCL 5 MG/5ML PO SOLN: 5.0000 mg | Freq: Once | ORAL | Status: DC | PRN

## 2018-02-21 MED ORDER — PHENYLEPHRINE 40 MCG/ML (10ML) SYRINGE FOR IV PUSH (FOR BLOOD PRESSURE SUPPORT)
PREFILLED_SYRINGE | INTRAVENOUS | Status: AC
  Filled 2018-02-21: qty 10

## 2018-02-21 MED ORDER — DEXAMETHASONE SODIUM PHOSPHATE 10 MG/ML IJ SOLN
INTRAMUSCULAR | Status: AC
  Filled 2018-02-21: qty 1

## 2018-02-21 MED ORDER — LIDOCAINE 2% (20 MG/ML) 5 ML SYRINGE
INTRAMUSCULAR | Status: AC
  Filled 2018-02-21: qty 5

## 2018-02-21 MED ORDER — ACETAMINOPHEN 10 MG/ML IV SOLN
1000.0000 mg | Freq: Four times a day (QID) | INTRAVENOUS | Status: DC
  Administered 2018-02-21: 1000 mg via INTRAVENOUS

## 2018-02-21 MED ORDER — DEXAMETHASONE SODIUM PHOSPHATE 10 MG/ML IJ SOLN
INTRAMUSCULAR | Status: DC | PRN
  Administered 2018-02-21: 10 mg via INTRAVENOUS

## 2018-02-21 MED ORDER — IBUPROFEN 600 MG PO TABS: 600.0000 mg | ORAL_TABLET | Freq: Four times a day (QID) | ORAL | Status: DC | PRN

## 2018-02-21 MED ORDER — KETOROLAC TROMETHAMINE 30 MG/ML IJ SOLN: 30.0000 mg | Freq: Four times a day (QID) | INTRAMUSCULAR | Status: DC | PRN

## 2018-02-21 MED ORDER — FENTANYL CITRATE (PF) 250 MCG/5ML IJ SOLN
INTRAMUSCULAR | Status: AC
  Filled 2018-02-21: qty 5

## 2018-02-21 MED ORDER — ONDANSETRON HCL 4 MG/2ML IJ SOLN
INTRAMUSCULAR | Status: AC
  Administered 2018-02-21: 4 mg via INTRAVENOUS
  Filled 2018-02-21: qty 2

## 2018-02-21 MED ORDER — ROCURONIUM BROMIDE 50 MG/5ML IV SOSY
PREFILLED_SYRINGE | INTRAVENOUS | Status: AC
  Filled 2018-02-21: qty 5

## 2018-02-21 MED ORDER — LIDOCAINE 2% (20 MG/ML) 5 ML SYRINGE
INTRAMUSCULAR | Status: DC | PRN
  Administered 2018-02-21: 80 mg via INTRAVENOUS

## 2018-02-21 MED ORDER — PHENYLEPHRINE 40 MCG/ML (10ML) SYRINGE FOR IV PUSH (FOR BLOOD PRESSURE SUPPORT)
PREFILLED_SYRINGE | INTRAVENOUS | Status: DC | PRN
  Administered 2018-02-21 (×2): 80 ug via INTRAVENOUS

## 2018-02-21 MED ORDER — PROMETHAZINE HCL 25 MG/ML IJ SOLN: 6.2500 mg | INTRAMUSCULAR | Status: DC | PRN

## 2018-02-21 MED ORDER — SODIUM CHLORIDE 0.9 % IR SOLN
Status: DC | PRN
  Administered 2018-02-21: 1000 mL

## 2018-02-21 MED ORDER — HYDROMORPHONE HCL 1 MG/ML IJ SOLN: 1.0000 mg | INTRAMUSCULAR | Status: DC | PRN

## 2018-02-21 MED ORDER — ONDANSETRON HCL 4 MG/2ML IJ SOLN
INTRAMUSCULAR | Status: DC | PRN
  Administered 2018-02-21: 4 mg via INTRAVENOUS

## 2018-02-21 SURGICAL SUPPLY — 47 items
APPLIER CLIP ROT 10 11.4 M/L (STAPLE) ×3
BENZOIN TINCTURE PRP APPL 2/3 (GAUZE/BANDAGES/DRESSINGS) ×3 IMPLANT
BLADE CLIPPER SURG (BLADE) IMPLANT
CANISTER SUCT 3000ML PPV (MISCELLANEOUS) ×3 IMPLANT
CHLORAPREP W/TINT 26ML (MISCELLANEOUS) ×3 IMPLANT
CLIP APPLIE ROT 10 11.4 M/L (STAPLE) ×1 IMPLANT
CLOSURE WOUND 1/2 X4 (GAUZE/BANDAGES/DRESSINGS) ×1
COVER MAYO STAND STRL (DRAPES) ×3 IMPLANT
COVER SURGICAL LIGHT HANDLE (MISCELLANEOUS) ×3 IMPLANT
COVER WAND RF STERILE (DRAPES) ×3 IMPLANT
DERMABOND ADVANCED (GAUZE/BANDAGES/DRESSINGS) ×2
DERMABOND ADVANCED .7 DNX12 (GAUZE/BANDAGES/DRESSINGS) ×1 IMPLANT
DRAPE C-ARM 42X72 X-RAY (DRAPES) ×3 IMPLANT
DRSG TEGADERM 2-3/8X2-3/4 SM (GAUZE/BANDAGES/DRESSINGS) ×3 IMPLANT
DRSG TEGADERM 4X4.75 (GAUZE/BANDAGES/DRESSINGS) ×3 IMPLANT
ELECT REM PT RETURN 9FT ADLT (ELECTROSURGICAL) ×3
ELECTRODE REM PT RTRN 9FT ADLT (ELECTROSURGICAL) ×1 IMPLANT
FILTER SMOKE EVAC LAPAROSHD (FILTER) ×3 IMPLANT
GAUZE SPONGE 2X2 8PLY STRL LF (GAUZE/BANDAGES/DRESSINGS) ×1 IMPLANT
GLOVE BIO SURGEON STRL SZ7 (GLOVE) ×3 IMPLANT
GLOVE BIOGEL PI IND STRL 7.5 (GLOVE) ×1 IMPLANT
GLOVE BIOGEL PI INDICATOR 7.5 (GLOVE) ×2
GOWN STRL REUS W/ TWL LRG LVL3 (GOWN DISPOSABLE) ×3 IMPLANT
GOWN STRL REUS W/TWL LRG LVL3 (GOWN DISPOSABLE) ×6
KIT BASIN OR (CUSTOM PROCEDURE TRAY) ×3 IMPLANT
KIT TURNOVER KIT B (KITS) ×3 IMPLANT
NS IRRIG 1000ML POUR BTL (IV SOLUTION) ×3 IMPLANT
PAD ARMBOARD 7.5X6 YLW CONV (MISCELLANEOUS) ×3 IMPLANT
POUCH RETRIEVAL ECOSAC 10 (ENDOMECHANICALS) IMPLANT
POUCH RETRIEVAL ECOSAC 10MM (ENDOMECHANICALS)
POUCH SPECIMEN RETRIEVAL 10MM (ENDOMECHANICALS) ×3 IMPLANT
SCISSORS LAP 5X35 DISP (ENDOMECHANICALS) ×3 IMPLANT
SET CHOLANGIOGRAPH 5 50 .035 (SET/KITS/TRAYS/PACK) ×3 IMPLANT
SET IRRIG TUBING LAPAROSCOPIC (IRRIGATION / IRRIGATOR) ×3 IMPLANT
SET TUBE SMOKE EVAC HIGH FLOW (TUBING) ×3 IMPLANT
SLEEVE ENDOPATH XCEL 5M (ENDOMECHANICALS) ×3 IMPLANT
SPECIMEN JAR SMALL (MISCELLANEOUS) ×3 IMPLANT
SPONGE GAUZE 2X2 STER 10/PKG (GAUZE/BANDAGES/DRESSINGS) ×2
STRIP CLOSURE SKIN 1/2X4 (GAUZE/BANDAGES/DRESSINGS) ×2 IMPLANT
SUT MNCRL AB 4-0 PS2 18 (SUTURE) ×3 IMPLANT
TOWEL OR 17X24 6PK STRL BLUE (TOWEL DISPOSABLE) ×3 IMPLANT
TOWEL OR 17X26 10 PK STRL BLUE (TOWEL DISPOSABLE) ×3 IMPLANT
TRAY LAPAROSCOPIC MC (CUSTOM PROCEDURE TRAY) ×3 IMPLANT
TROCAR XCEL BLUNT TIP 100MML (ENDOMECHANICALS) ×3 IMPLANT
TROCAR XCEL NON-BLD 11X100MML (ENDOMECHANICALS) ×3 IMPLANT
TROCAR XCEL NON-BLD 5MMX100MML (ENDOMECHANICALS) ×3 IMPLANT
WATER STERILE IRR 1000ML POUR (IV SOLUTION) ×3 IMPLANT

## 2018-02-21 NOTE — Anesthesia Preprocedure Evaluation (Addendum)
Anesthesia Evaluation  Patient identified by MRN, date of birth, ID band Patient awake    Reviewed: Allergy & Precautions, NPO status , Patient's Chart, lab work & pertinent test results  History of Anesthesia Complications Negative for: history of anesthetic complications  Airway Mallampati: III   Neck ROM: Full    Dental  (+) Dental Advisory Given, Teeth Intact   Pulmonary   High clinical suspicion for undiagnosed OSA    breath sounds clear to auscultation       Cardiovascular hypertension (denies), Pt. on medications  Rhythm:Regular Rate:Normal   '18 TTE - Mild concentric LVH. EF 55% to 60%. Mild MR. Trivial TR. A trivial pericardial effusion was identified.    Neuro/Psych  Headaches, PSYCHIATRIC DISORDERS Anxiety    GI/Hepatic negative GI ROS, (+)     substance abuse  marijuana use,   Endo/Other  diabetes (denies), Type 2Morbid obesity  Renal/GU negative Renal ROS     Musculoskeletal negative musculoskeletal ROS (+)   Abdominal (+) + obese,   Peds  Hematology  (+) anemia ,   Anesthesia Other Findings   Reproductive/Obstetrics                            Anesthesia Physical Anesthesia Plan  ASA: III  Anesthesia Plan: General   Post-op Pain Management:    Induction: Intravenous  PONV Risk Score and Plan: 4 or greater and Treatment may vary due to age or medical condition, Ondansetron, Scopolamine patch - Pre-op, Midazolam and Dexamethasone  Airway Management Planned: Oral ETT and Video Laryngoscope Planned  Additional Equipment: None  Intra-op Plan:   Post-operative Plan: Extubation in OR  Informed Consent: I have reviewed the patients History and Physical, chart, labs and discussed the procedure including the risks, benefits and alternatives for the proposed anesthesia with the patient or authorized representative who has indicated his/her understanding and acceptance.    Dental advisory given  Plan Discussed with: CRNA and Anesthesiologist  Anesthesia Plan Comments:        Anesthesia Quick Evaluation

## 2018-02-21 NOTE — Progress Notes (Signed)
Day of Surgery   Subjective/Chief Complaint: Still with some RUQ discomfort   Objective: Vital signs in last 24 hours: Temp:  [97.5 F (36.4 C)-97.8 F (36.6 C)] 97.6 F (36.4 C) (01/13 0419) Pulse Rate:  [78-83] 81 (01/13 0419) Resp:  [16-20] 20 (01/13 0419) BP: (120-153)/(56-96) 136/91 (01/13 0419) SpO2:  [95 %-99 %] 95 % (01/13 0419)    Intake/Output from previous day: 01/12 0701 - 01/13 0700 In: 2452.1 [P.O.:890; I.V.:1462.1; IV Piggyback:100] Out: 850 [Urine:850] Intake/Output this shift: No intake/output data recorded.  General appearance: alert and cooperative GI: soft,obese, RUQ tenderness  Lab Results:  Recent Labs    02/20/18 0023 02/20/18 0443  WBC 13.0* 10.2  HGB 11.7* 10.1*  HCT 38.0 33.8*  PLT 482* 384   BMET Recent Labs    02/20/18 0023 02/20/18 0443  NA 141  --   K 4.2  --   CL 105  --   CO2 25  --   GLUCOSE 146*  --   BUN 10  --   CREATININE 0.81 0.88  CALCIUM 9.3  --    PT/INR No results for input(s): LABPROT, INR in the last 72 hours. ABG No results for input(s): PHART, HCO3 in the last 72 hours.  Invalid input(s): PCO2, PO2  Studies/Results: Dg Chest 2 View  Result Date: 02/20/2018 CLINICAL DATA:  Left chest pain radiating to the central chest and left shoulder since yesterday. Shortness of breath worse on exertion. Nausea. Cough. Double vision and seeing great spots. EXAM: CHEST - 2 VIEW COMPARISON:  03/11/2017 FINDINGS: The heart size and mediastinal contours are within normal limits. Both lungs are clear. The visualized skeletal structures are unremarkable. IMPRESSION: No active cardiopulmonary disease. Electronically Signed   By: Lucienne Capers M.D.   On: 02/20/2018 01:52   US Abdomen Limited  Result Date: 02/20/2018 CLINICAL DATA:  Right upper quadrant pain. EXAM: ULTRASOUND ABDOMEN LIMITED RIGHT UPPER QUADRANT COMPARISON:  02/21/2017 FINDINGS: Gallbladder: Gallbladder is contracted with stones and sludge visible. Murphy's  sign is positive. Mild gallbladder wall thickening. Common bile duct: Diameter: 2.4 mm, normal Liver: Increased parenchymal echotexture suggesting diffuse fatty infiltration. No focal lesions identified. Portal vein is patent on color Doppler imaging with normal direction of blood flow towards the liver. IMPRESSION: Contracted gallbladder with cholelithiasis and positive Murphy's sign. Changes are consistent with acute cholecystitis in the appropriate clinical setting. Electronically Signed   By: Lucienne Capers M.D.   On: 02/20/2018 01:45    Anti-infectives: Anti-infectives (From admission, onward)   Start     Dose/Rate Route Frequency Ordered Stop   02/20/18 0500  [MAR Hold]  cefTRIAXone (ROCEPHIN) 2 g in sodium chloride 0.9 % 100 mL IVPB     (MAR Hold since Mon 02/21/2018 at 0656. Reason: Transfer to a Procedural area.)   2 g 200 mL/hr over 30 Minutes Intravenous Every 24 hours 02/20/18 0434        Assessment/Plan: Acute cholecystitis  Lap chole with IOC today.  The surgical procedure has been discussed with the patient.  Potential risks, benefits, alternative treatments, and expected outcomes have been explained.  All of the patient's questions at this time have been answered.  The likelihood of reaching the patient's treatment goal is good.  The patient understand the proposed surgical procedure and wishes to proceed.   LOS: 0 days    Cassandra Greer 02/21/2018

## 2018-02-21 NOTE — Anesthesia Postprocedure Evaluation (Signed)
Anesthesia Post Note  Patient: Cassandra Greer  Procedure(s) Performed: LAPAROSCOPIC CHOLECYSTECTOMY WITH INTRAOPERATIVE CHOLANGIOGRAM (N/A Abdomen)     Patient location during evaluation: PACU Anesthesia Type: General Level of consciousness: awake and alert Pain management: pain level controlled Vital Signs Assessment: post-procedure vital signs reviewed and stable Respiratory status: spontaneous breathing, nonlabored ventilation, respiratory function stable and patient connected to nasal cannula oxygen Cardiovascular status: blood pressure returned to baseline and stable Postop Assessment: no apparent nausea or vomiting Anesthetic complications: no    Last Vitals:  Vitals:   02/21/18 1110 02/21/18 1138  BP: (!) 151/89 (!) 149/97  Pulse: 81 82  Resp: 19   Temp:  (!) 36.3 C  SpO2: 97% 97%    Last Pain:  Vitals:   02/21/18 1138  TempSrc: Oral  PainSc:                  [name]

## 2018-02-21 NOTE — Anesthesia Procedure Notes (Signed)
Procedure Name: Intubation Date/Time: 02/21/2018 7:42 AM Performed by: Kyung Rudd, CRNA Pre-anesthesia Checklist: Patient identified, Emergency Drugs available, Suction available, Patient being monitored and Timeout performed Patient Re-evaluated:Patient Re-evaluated prior to induction Oxygen Delivery Method: Circle system utilized Preoxygenation: Pre-oxygenation with 100% oxygen Induction Type: IV induction Ventilation: Mask ventilation without difficulty and Oral airway inserted - appropriate to patient size Laryngoscope Size: Glidescope and 3 Tube type: Oral Tube size: 7.0 mm Number of attempts: 1 Airway Equipment and Method: Stylet and Video-laryngoscopy Placement Confirmation: ETT inserted through vocal cords under direct vision,  positive ETCO2 and breath sounds checked- equal and bilateral Secured at: 21 cm Tube secured with: Tape Dental Injury: Teeth and Oropharynx as per pre-operative assessment

## 2018-02-21 NOTE — Op Note (Signed)
Laparoscopic Cholecystectomy with IOC Procedure Note  Indications: This patient presents with symptomatic gallbladder disease and will undergo laparoscopic cholecystectomy.  Pre-operative Diagnosis: Calculus of gallbladder with other cholecystitis, without mention of obstruction  Post-operative Diagnosis: Same  Surgeon: Maia Petties   Assistants: none  Anesthesia: General endotracheal anesthesia  ASA Class: 2  Procedure Details  The patient was seen again in the Holding Room. The risks, benefits, complications, treatment options, and expected outcomes were discussed with the patient. The possibilities of reaction to medication, pulmonary aspiration, perforation of viscus, bleeding, recurrent infection, finding a normal gallbladder, the need for additional procedures, failure to diagnose a condition, the possible need to convert to an open procedure, and creating a complication requiring transfusion or operation were discussed with the patient. The likelihood of improving the patient's symptoms with return to their baseline status is good.  The patient and/or family concurred with the proposed plan, giving informed consent. The site of surgery properly noted. The patient was taken to Operating Room, identified as Cassandra Greer and the procedure verified as Laparoscopic Cholecystectomy with Intraoperative Cholangiogram. A Time Out was held and the above information confirmed.  Prior to the induction of general anesthesia, antibiotic prophylaxis was administered. General endotracheal anesthesia was then administered and tolerated well. After the induction, the abdomen was prepped with Chloraprep and draped in the sterile fashion. The patient was positioned in the supine position.  Local anesthetic agent was injected into the skin above the umbilicus and an incision made. We dissected down to the abdominal fascia with blunt dissection.  The fascia was incised vertically and we entered the  peritoneal cavity bluntly.  A pursestring suture of 0-Vicryl was placed around the fascial opening.  The Hasson cannula was inserted and secured with the stay suture.  Pneumoperitoneum was then created with CO2 and tolerated well without any adverse changes in the patient's vital signs. An 11-mm port was placed in the subxiphoid position.  Two 5-mm ports were placed in the right upper quadrant. All skin incisions were infiltrated with a local anesthetic agent before making the incision and placing the trocars.   We positioned the patient in reverse Trendelenburg, tilted slightly to the patient's left.  The gallbladder was identified, the fundus grasped and retracted cephalad. Adhesions were lysed bluntly and with the electrocautery where indicated, taking care not to injure any adjacent organs or viscus. The infundibulum was grasped and retracted laterally, exposing the peritoneum overlying the triangle of Calot. This was then divided and exposed in a blunt fashion. A critical view of the cystic duct and cystic artery was obtained.  The cystic duct was clearly identified and bluntly dissected circumferentially. The cystic duct was ligated with a clip distally.   An incision was made in the cystic duct and the [facility] cholangiogram catheter introduced. The catheter was secured using a clip. A cholangiogram was then obtained which showed good visualization of the distal and proximal biliary tree with no sign of filling defects or obstruction.  Contrast flowed easily into the duodenum. The catheter was then removed.   The cystic duct was then ligated with clips and divided. The cystic artery was identified, dissected free, ligated with clips and divided as well.   The gallbladder was dissected from the liver bed in retrograde fashion with the electrocautery. The gallbladder was removed and placed in an Endocatch sac. The liver bed was irrigated and inspected. Hemostasis was achieved with the electrocautery. Copious  irrigation was utilized and was repeatedly aspirated until  clear.  The gallbladder and Endocatch sac were then removed through the umbilical port site.  The pursestring suture was used to close the umbilical fascia.    We again inspected the right upper quadrant for hemostasis.  Pneumoperitoneum was released as we removed the trocars.  4-0 Monocryl was used to close the skin.   Benzoin, steri-strips, and clean dressings were applied. The patient was then extubated and brought to the recovery room in stable condition. Instrument, sponge, and needle counts were correct at closure and at the conclusion of the case.   Findings: Cholecystitis with Cholelithiasis  Estimated Blood Loss: Minimal         Drains: none         Specimens: Gallbladder           Complications: None; patient tolerated the procedure well.         Disposition: PACU - hemodynamically stable.         Condition: stable  Cassandra Greer. Cassandra Dover, MD, [facility]  General/ Trauma Surgery Beeper [phone]  02/21/2018 8:53 AM

## 2018-02-21 NOTE — Plan of Care (Signed)

## 2018-02-21 NOTE — Transfer of Care (Signed)
Immediate Anesthesia Transfer of Care Note  Patient: Cassandra Greer  Procedure(s) Performed: LAPAROSCOPIC CHOLECYSTECTOMY WITH INTRAOPERATIVE CHOLANGIOGRAM (N/A Abdomen)  Patient Location: PACU  Anesthesia Type:General  Level of Consciousness: awake, alert  and oriented  Airway & Oxygen Therapy: Patient Spontanous Breathing and Patient connected to face mask oxygen  Post-op Assessment: Report given to RN, Post -op Vital signs reviewed and stable and Patient moving all extremities X 4  Post vital signs: Reviewed and stable  Last Vitals:  Vitals Value Taken Time  BP 154/100 02/21/2018  9:10 AM  Temp    Pulse 90 02/21/2018  9:13 AM  Resp 16 02/21/2018  9:13 AM  SpO2 87 % 02/21/2018  9:13 AM  Vitals shown include unvalidated device data.  Last Pain:  Vitals:   02/21/18 0419  TempSrc: Oral  PainSc:          Complications: No apparent anesthesia complications

## 2018-02-21 NOTE — Discharge Instructions (Signed)
[facility] ° °Please arrive at least 30 min before your appointment to complete your check in paperwork.  If you are unable to arrive 30 min prior to your appointment time we may have to cancel or reschedule you. °LAPAROSCOPIC SURGERY: POST OP INSTRUCTIONS °Always review your discharge instruction sheet given to you by the facility where your surgery was performed. °IF YOU HAVE DISABILITY OR FAMILY LEAVE FORMS, YOU MUST BRING THEM TO THE OFFICE FOR PROCESSING.   °DO NOT GIVE THEM TO YOUR DOCTOR. ° °PAIN CONTROL ° °1. First take acetaminophen (Tylenol) AND/or ibuprofen (Advil) to control your pain after surgery.  Follow directions on package.  Taking acetaminophen (Tylenol) and/or ibuprofen (Advil) regularly after surgery will help to control your pain and lower the amount of prescription pain medication you may need.  You should not take more than 4,000 mg (4 grams) of acetaminophen (Tylenol) in 24 hours.  You should not take ibuprofen (Advil), aleve, motrin, naprosyn or other NSAIDS if you have a history of stomach ulcers or chronic kidney disease.  °2. A prescription for pain medication may be given to you upon discharge.  Take your pain medication as prescribed, if you still have uncontrolled pain after taking acetaminophen (Tylenol) or ibuprofen (Advil). °3. Use ice packs to help control pain. °4. If you need a refill on your pain medication, please contact your pharmacy.  They will contact our office to request authorization. Prescriptions will not be filled after 5pm or on week-ends. ° °HOME MEDICATIONS °5. Take your usually prescribed medications unless otherwise directed. ° °DIET °6. You should follow a light diet the first few days after arrival home.  Be sure to include lots of fluids daily. Avoid fatty, fried foods.  ° °CONSTIPATION °7. It is common to experience some constipation after surgery and if you are taking pain medication.  Increasing fluid intake and taking a stool  softener (such as Colace) will usually help or prevent this problem from occurring.  A mild laxative (Milk of Magnesia or Miralax) should be taken according to package instructions if there are no bowel movements after 48 hours. ° °WOUND/INCISION CARE °8. Most patients will experience some swelling and bruising in the area of the incisions.  Ice packs will help.  Swelling and bruising can take several days to resolve.  °9. Unless discharge instructions indicate otherwise, follow guidelines below  °a. STERI-STRIPS - you may remove your outer bandages 48 hours after surgery, and you may shower at that time.  You have steri-strips (small skin tapes) in place directly over the incision.  These strips should be left on the skin for 7-10 days.   °b. DERMABOND/SKIN GLUE - you may shower in 24 hours.  The glue will flake off over the next 2-3 weeks. °10. Any sutures or staples will be removed at the office during your follow-up visit. ° °ACTIVITIES °11. You may resume regular (light) daily activities beginning the next day--such as daily self-care, walking, climbing stairs--gradually increasing activities as tolerated.  You may have sexual intercourse when it is comfortable.  Refrain from any heavy lifting or straining until approved by your doctor. °a. You may drive when you are no longer taking prescription pain medication, you can comfortably wear a seatbelt, and you can safely maneuver your car and apply brakes. ° °FOLLOW-UP °12. You should see your doctor in the office for a follow-up appointment approximately 2-3 weeks after your surgery.  You should have been given your post-op/follow-up appointment when   your surgery was scheduled.  If you did not receive a post-op/follow-up appointment, make sure that you call for this appointment within a day or two after you arrive home to insure a convenient appointment time. ° °OTHER INSTRUCTIONS ° °WHEN TO CALL YOUR DOCTOR: °1. Fever over 101.0 °2. Inability to  urinate °3. Continued bleeding from incision. °4. Increased pain, redness, or drainage from the incision. °5. Increasing abdominal pain ° °The clinic staff is available to answer your questions during regular business hours.  Please don’t hesitate to call and ask to speak to one of the nurses for clinical concerns.  If you have a medical emergency, go to the nearest emergency room or call 911.  A surgeon from [facility] is always on call at the hospital. °[address], [address], [place], [place]  [address] ? [address] [address], [place], [place]   [address] °[phone] ? [phone] ? FAX [phone] ° ° ° °

## 2018-02-22 ENCOUNTER — Encounter (HOSPITAL_COMMUNITY): Payer: Self-pay | Admitting: Surgery

## 2018-02-22 LAB — COMPREHENSIVE METABOLIC PANEL
ALT: 30 U/L (ref 0–44)
AST: 33 U/L (ref 15–41)
Albumin: 3.2 g/dL — ABNORMAL LOW (ref 3.5–5.0)
Alkaline Phosphatase: 74 U/L (ref 38–126)
Anion gap: 10 (ref 5–15)
BUN: 7 mg/dL (ref 6–20)
CO2: 24 mmol/L (ref 22–32)
Calcium: 8.8 mg/dL — ABNORMAL LOW (ref 8.9–10.3)
Chloride: 105 mmol/L (ref 98–111)
Creatinine, Ser: 0.72 mg/dL (ref 0.44–1.00)
GFR calc Af Amer: >60 mL/min (ref >60)
GFR calc non Af Amer: >60 mL/min (ref >60)
GLUCOSE: 135 mg/dL — AB (ref 70–99)
Potassium: 3.9 mmol/L (ref 3.5–5.1)
Sodium: 139 mmol/L (ref 135–145)
Total Bilirubin: 0.3 mg/dL (ref 0.3–1.2)
Total Protein: 6.6 g/dL (ref 6.5–8.1)

## 2018-02-22 LAB — CBC
HCT: 33.4 % — ABNORMAL LOW (ref 36.0–46.0)
Hemoglobin: 10.5 g/dL — ABNORMAL LOW (ref 12.0–15.0)
MCH: 26.9 pg (ref 26.0–34.0)
MCHC: 31.4 g/dL (ref 30.0–36.0)
MCV: 85.4 fL (ref 80.0–100.0)
Platelets: 422 10*3/uL — ABNORMAL HIGH (ref 150–400)
RBC: 3.91 MIL/uL (ref 3.87–5.11)
RDW: 14.1 % (ref 11.5–15.5)
WBC: 17.7 10*3/uL — ABNORMAL HIGH (ref 4.0–10.5)
nRBC: 0 % (ref 0.0–0.2)

## 2018-02-22 MED ORDER — INFLUENZA VAC SPLIT HIGH-DOSE 0.5 ML IM SUSY
0.5000 mL | PREFILLED_SYRINGE | Freq: Once | INTRAMUSCULAR | Status: AC
  Administered 2018-02-22: 0.5 mL via INTRAMUSCULAR
  Filled 2018-02-22: qty 0.5

## 2018-02-22 MED ORDER — POLYETHYLENE GLYCOL 3350 17 G PO PACK: 17.0000 g | PACK | Freq: Every day | ORAL | 0 refills | Status: DC | PRN

## 2018-02-22 MED ORDER — ONDANSETRON 4 MG PO TBDP: 4.0000 mg | ORAL_TABLET | Freq: Four times a day (QID) | ORAL | 0 refills | Status: DC | PRN

## 2018-02-22 MED ORDER — OXYCODONE HCL 5 MG PO TABS: 5.0000 mg | ORAL_TABLET | Freq: Four times a day (QID) | ORAL | 0 refills | Status: DC | PRN

## 2018-02-22 NOTE — Progress Notes (Signed)
Patient ID: Cassandra Greer, female   DOB: 13-Apr-1975, 43 y.o.   MRN: [id number]    1 Day Post-Op  Subjective: Reports some pain this morning. Only taking po pain medication. Some nausea without emesis. No flatus or BM. Voiding. Ambulating well and using IS.   Objective: Vital signs in last 24 hours: Temp:  [97.4 F (36.3 C)-98.8 F (37.1 C)] 98.4 F (36.9 C) (01/14 0513) Pulse Rate:  [76-98] 80 (01/14 0513) Resp:  [13-19] 19 (01/14 0513) BP: (133-170)/(80-109) 133/80 (01/14 0513) SpO2:  [95 %-98 %] 98 % (01/14 0513) Last BM Date: (Patient unsure)  Intake/Output from previous day: 01/13 0701 - 01/14 0700 In: 1417.8 [I.V.:1417.8] Out: 50 [Blood:50] Intake/Output this shift: No intake/output data recorded.  PE: Heart: RRR Lungs: CTA b/l Abd: Soft, ND, appropriately tender. +BS. Tegederm intake. No surrounding erythema or drainage.   Lab Results:  Recent Labs    02/20/18 0443 02/22/18 0306  WBC 10.2 17.7*  HGB 10.1* 10.5*  HCT 33.8* 33.4*  PLT 384 422*   BMET Recent Labs    02/20/18 0023 02/20/18 0443 02/22/18 0306  NA 141  --  139  K 4.2  --  3.9  CL 105  --  105  CO2 25  --  24  GLUCOSE 146*  --  135*  BUN 10  --  7  CREATININE 0.81 0.88 0.72  CALCIUM 9.3  --  8.8*   PT/INR No results for input(s): LABPROT, INR in the last 72 hours. CMP     Component Value Date/Time   NA 139 02/22/2018 0306   K 3.9 02/22/2018 0306   CL 105 02/22/2018 0306   CO2 24 02/22/2018 0306   GLUCOSE 135 (H) 02/22/2018 0306   BUN 7 02/22/2018 0306   CREATININE 0.72 02/22/2018 0306   CALCIUM 8.8 (L) 02/22/2018 0306   PROT 6.6 02/22/2018 0306   ALBUMIN 3.2 (L) 02/22/2018 0306   AST 33 02/22/2018 0306   ALT 30 02/22/2018 0306   ALKPHOS 74 02/22/2018 0306   BILITOT 0.3 02/22/2018 0306   GFRNONAA >60 02/22/2018 0306   GFRAA >60 02/22/2018 0306   Lipase     Component Value Date/Time   LIPASE 29 02/21/2017 2139       Studies/Results: Dg Cholangiogram  Operative  Result Date: 02/21/2018 CLINICAL DATA:  Laparoscopic cholecystectomy and gallstones EXAM: INTRAOPERATIVE CHOLANGIOGRAM TECHNIQUE: Cholangiographic images from the C-arm fluoroscopic device were submitted for interpretation post-operatively. Please see the procedural report for the amount of contrast and the fluoroscopy time utilized. COMPARISON:  None. FINDINGS: Contrast fills the biliary tree and duodenum without filling defects in the common bile duct. IMPRESSION: Patent biliary tree. Electronically Signed   By: Marybelle Killings M.D.   On: 02/21/2018 08:44    Anti-infectives: Anti-infectives (From admission, onward)   Start     Dose/Rate Route Frequency Ordered Stop   02/20/18 0500  cefTRIAXone (ROCEPHIN) 2 g in sodium chloride 0.9 % 100 mL IVPB  Status:  Discontinued     2 g 200 mL/hr over 30 Minutes Intravenous Every 24 hours 02/20/18 0434 02/21/18 1144       Assessment/Plan POD 1, s/p lap chole w. IOC for Cholecystitis, Dr. Georgette Dover (1/13) -Pain controlled with PO pain medication. No emesis. Some nausea -WBC elevated post surgery. No fevers -Tolerating diet.  -Voiding -Mobilize and IS -Possible d/c later today. Will recheck this afternoon.   HTN -Continue home medications.  -Elevated at times here. Will need to follow up with pcp  as outpatient.   FEN - Soft VTE - Lovenox ID - Rocephin 1/12-1/13   LOS: 0 days    Jillyn Ledger , [facility] 02/22/2018, 10:08 AM Pager: [phone]

## 2018-02-22 NOTE — Progress Notes (Signed)
RN checked on pt.- bed alarm went off; 2nd time pt. found with bed linens all over the bed and her gown and everything off; pt. seemed confused again; standby assist to bathroom; c/o's abdominal pain and informed too early to give Oxyir again- will give Tylenol.

## 2018-02-22 NOTE — Discharge Summary (Signed)
    Patient ID: Cassandra Greer [id number] 10-08-1975 43 y.o.  Admit date: 02/20/2018 Discharge date: 02/22/2018  Admitting Diagnosis: Acute cholecystitis   Discharge Diagnosis Patient Active Problem List   Diagnosis Date Noted  . Acute cholecystitis due to biliary calculus 02/20/2018  . Generalized anxiety disorder with panic attacks 12/24/2016  . Type 2 diabetes mellitus with hyperlipidemia ([place]) 12/22/2016  . Hyperlipidemia 12/22/2016  . Chest pain 12/21/2016  . Accelerated hypertension 12/21/2016  . Hyperglycemia 12/21/2016  . Epigastric abdominal pain 12/21/2016  . Fatty liver disease, nonalcoholic 40/98/1191  . Thrombocytosis ([place]) 12/21/2016  . Panic disorder (episodic paroxysmal anxiety) 12/21/2016  . HTN (hypertension) 03/08/2013  . Hypokalemia 03/08/2013  . Headache(784.0) 03/08/2013  . Colitis 03/05/2013  . Leukocytosis 03/05/2013  . Sinusitis 03/05/2013    Consultants None  Reason for Admission: 43 year old female who presents with approximately 18 months of intermittent RUQ/ R flank pain.  This is exacerbated by eating greasy, spicy foods.  Symptoms associated with nausea, vomiting, diarrhea, abdominal bloating.  She has had several ED visits and has been ruled out for cardiac disease.  This most recent episode began a few days ago.  Previous ultrasounds showed only some sludge in the GB and some fatty liver.  Procedures Laparoscopic Cholecystectomy with [facility]   Hospital Course:  The patient was admitted and underwent a laparoscopic cholecystectomy with IOC.  The patient tolerated the procedure well.  On POD 1, the patient was tolerating a regular diet, voiding well, mobilizing, and pain was controlled with oral pain medications.  The patient was stable for DC home at this time with appropriate follow up made.  Physical Exam: Heart: RRR Lungs: CTA b/l Abd: Soft, ND, appropriately tender. +BS. Tegederm intake. No surrounding erythema or drainage.    Allergies as of 02/22/2018   No Known Allergies     Medication List    TAKE these medications   lisinopril 20 MG tablet Commonly known as:  PRINIVIL,ZESTRIL Take 20 mg daily by mouth.   ondansetron 4 MG disintegrating tablet Commonly known as:  ZOFRAN-ODT Take 1 tablet (4 mg total) by mouth every 6 (six) hours as needed for nausea.   oxyCODONE 5 MG immediate release tablet Commonly known as:  Oxy IR/ROXICODONE Take 1 tablet (5 mg total) by mouth every 6 (six) hours as needed.   polyethylene glycol packet Commonly known as:  MIRALAX / GLYCOLAX Take 17 g by mouth daily as needed for mild constipation or moderate constipation.   PROZAC 10 MG capsule Generic drug:  FLUoxetine Take 10 mg daily by mouth.   QUEtiapine 50 MG tablet Commonly known as:  SEROQUEL Take 50 mg by mouth at bedtime.        Follow-up [facility], [place]. Go on 03/08/2018.   Specialty:  General Surgery Why:  Appointment scheduled for 9:45 AM.  Please arrive 30 minutes early to check in and fill out paperwork. Bring photo ID and insurance information. Contact information: [address] [place] [phone]       Aldona Bar, MD Follow up.   Specialty:  Internal Medicine Why:  For your elevated blood pressure Contact information: [place] [address] [phone]           Signed: Alferd Apa, [facility] 02/22/2018, 3:00 PM Pager: [phone]

## 2018-02-22 NOTE — Progress Notes (Signed)
RN heard IV pump beeping and checked on pt.; pt. seemed confused and had pulled IV out; think she forgot where she's at; pt. reoriented quickly. This is the 3rd IV site pt. has had, and doesn't want another one placed at this time. Pain med given- Oxyir 5mg  given instead of 10mg .

## 2018-02-22 NOTE — Plan of Care (Signed)
  Problem: Education: Goal: Knowledge of General Education information will improve Description Including pain rating scale, medication(s)/side effects and non-pharmacologic comfort measures Outcome: Progressing Note:  POC and pain management reviewed with pt.   

## 2018-02-22 NOTE — Progress Notes (Signed)
Patient discharged to home. Alert and oriented. Discharge Instructions were explained and patient showed understanding.

## 2018-03-14 ENCOUNTER — Other Ambulatory Visit: Payer: Self-pay

## 2018-03-14 ENCOUNTER — Inpatient Hospital Stay (HOSPITAL_COMMUNITY)
Admission: EM | Admit: 2018-03-14 | Discharge: 2018-03-17 | DRG: 392 | Disposition: A | Discharge status: Home / Self Care | Payer: Medicaid Other | Source: Ambulatory Visit | Attending: Family Medicine | Admitting: Family Medicine

## 2018-03-14 ENCOUNTER — Encounter (HOSPITAL_COMMUNITY): Payer: Self-pay | Admitting: *Deleted

## 2018-03-14 DIAGNOSIS — D72829 Elevated white blood cell count, unspecified: Secondary | ICD-10-CM | POA: Diagnosis present

## 2018-03-14 DIAGNOSIS — Z6838 Body mass index (BMI) 38.0-38.9, adult: Secondary | ICD-10-CM

## 2018-03-14 DIAGNOSIS — K760 Fatty (change of) liver, not elsewhere classified: Secondary | ICD-10-CM | POA: Diagnosis present

## 2018-03-14 DIAGNOSIS — R197 Diarrhea, unspecified: Secondary | ICD-10-CM

## 2018-03-14 DIAGNOSIS — Z79899 Other long term (current) drug therapy: Secondary | ICD-10-CM

## 2018-03-14 DIAGNOSIS — F410 Panic disorder [episodic paroxysmal anxiety] without agoraphobia: Secondary | ICD-10-CM | POA: Diagnosis present

## 2018-03-14 DIAGNOSIS — I10 Essential (primary) hypertension: Secondary | ICD-10-CM | POA: Diagnosis present

## 2018-03-14 DIAGNOSIS — Z8249 Family history of ischemic heart disease and other diseases of the circulatory system: Secondary | ICD-10-CM

## 2018-03-14 DIAGNOSIS — D473 Essential (hemorrhagic) thrombocythemia: Secondary | ICD-10-CM | POA: Diagnosis present

## 2018-03-14 DIAGNOSIS — E876 Hypokalemia: Secondary | ICD-10-CM | POA: Diagnosis present

## 2018-03-14 DIAGNOSIS — R42 Dizziness and giddiness: Secondary | ICD-10-CM | POA: Diagnosis present

## 2018-03-14 DIAGNOSIS — E785 Hyperlipidemia, unspecified: Secondary | ICD-10-CM | POA: Diagnosis present

## 2018-03-14 DIAGNOSIS — I160 Hypertensive urgency: Secondary | ICD-10-CM | POA: Diagnosis present

## 2018-03-14 DIAGNOSIS — E1169 Type 2 diabetes mellitus with other specified complication: Secondary | ICD-10-CM | POA: Diagnosis present

## 2018-03-14 DIAGNOSIS — Z833 Family history of diabetes mellitus: Secondary | ICD-10-CM

## 2018-03-14 DIAGNOSIS — R1084 Generalized abdominal pain: Principal | ICD-10-CM | POA: Diagnosis present

## 2018-03-14 DIAGNOSIS — R109 Unspecified abdominal pain: Secondary | ICD-10-CM

## 2018-03-14 DIAGNOSIS — D75839 Thrombocytosis, unspecified: Secondary | ICD-10-CM | POA: Diagnosis present

## 2018-03-14 DIAGNOSIS — Z9049 Acquired absence of other specified parts of digestive tract: Secondary | ICD-10-CM

## 2018-03-14 DIAGNOSIS — R112 Nausea with vomiting, unspecified: Secondary | ICD-10-CM | POA: Diagnosis present

## 2018-03-14 DIAGNOSIS — R51 Headache: Secondary | ICD-10-CM | POA: Diagnosis present

## 2018-03-14 LAB — COMPREHENSIVE METABOLIC PANEL
ALT: 11 U/L (ref 0–44)
AST: 15 U/L (ref 15–41)
Albumin: 3.5 g/dL (ref 3.5–5.0)
Alkaline Phosphatase: 79 U/L (ref 38–126)
Anion gap: 11 (ref 5–15)
BUN: 12 mg/dL (ref 6–20)
CALCIUM: 9 mg/dL (ref 8.9–10.3)
CO2: 25 mmol/L (ref 22–32)
Chloride: 104 mmol/L (ref 98–111)
Creatinine, Ser: 0.73 mg/dL (ref 0.44–1.00)
GFR calc Af Amer: >60 mL/min (ref >60)
Glucose, Bld: 121 mg/dL — ABNORMAL HIGH (ref 70–99)
Potassium: 4.2 mmol/L (ref 3.5–5.1)
Sodium: 140 mmol/L (ref 135–145)
TOTAL PROTEIN: 6.6 g/dL (ref 6.5–8.1)
Total Bilirubin: 0.4 mg/dL (ref 0.3–1.2)

## 2018-03-14 LAB — CBC
HCT: 35.5 % — ABNORMAL LOW (ref 36.0–46.0)
Hemoglobin: 11.2 g/dL — ABNORMAL LOW (ref 12.0–15.0)
MCH: 26.9 pg (ref 26.0–34.0)
MCHC: 31.5 g/dL (ref 30.0–36.0)
MCV: 85.3 fL (ref 80.0–100.0)
Platelets: 444 10*3/uL — ABNORMAL HIGH (ref 150–400)
RBC: 4.16 MIL/uL (ref 3.87–5.11)
RDW: 14.2 % (ref 11.5–15.5)
WBC: 15.5 10*3/uL — ABNORMAL HIGH (ref 4.0–10.5)
nRBC: 0 % (ref 0.0–0.2)

## 2018-03-14 LAB — I-STAT BETA HCG BLOOD, ED (MC, WL, AP ONLY)

## 2018-03-14 LAB — LIPASE, BLOOD: Lipase: 31 U/L (ref 11–51)

## 2018-03-14 MED ORDER — SODIUM CHLORIDE 0.9% FLUSH: 3.0000 mL | Freq: Once | INTRAVENOUS | Status: DC

## 2018-03-14 NOTE — ED Triage Notes (Signed)
Pt reports hx of colitis. Having lower abd pain and bleeding since Friday. Having nausea and weakness.

## 2018-03-15 ENCOUNTER — Emergency Department (HOSPITAL_COMMUNITY): Payer: Medicaid Other

## 2018-03-15 DIAGNOSIS — I160 Hypertensive urgency: Secondary | ICD-10-CM | POA: Diagnosis not present

## 2018-03-15 LAB — CBC WITH DIFFERENTIAL/PLATELET
Abs Immature Granulocytes: 0.07 10*3/uL (ref 0.00–0.07)
Basophils Absolute: 0 10*3/uL (ref 0.0–0.1)
Basophils Relative: 0 %
Eosinophils Absolute: 0 10*3/uL (ref 0.0–0.5)
Eosinophils Relative: 0 %
HCT: 36.9 % (ref 36.0–46.0)
Hemoglobin: 11.5 g/dL — ABNORMAL LOW (ref 12.0–15.0)
Immature Granulocytes: 1 %
Lymphocytes Relative: 8 %
Lymphs Abs: 1.2 10*3/uL (ref 0.7–4.0)
MCH: 25.8 pg — ABNORMAL LOW (ref 26.0–34.0)
MCHC: 31.2 g/dL (ref 30.0–36.0)
MCV: 82.9 fL (ref 80.0–100.0)
Monocytes Absolute: 0.7 10*3/uL (ref 0.1–1.0)
Monocytes Relative: 5 %
Neutro Abs: 12.4 10*3/uL — ABNORMAL HIGH (ref 1.7–7.7)
Neutrophils Relative %: 86 %
Platelets: 454 10*3/uL — ABNORMAL HIGH (ref 150–400)
RBC: 4.45 MIL/uL (ref 3.87–5.11)
RDW: 14.2 % (ref 11.5–15.5)
WBC: 14.5 10*3/uL — ABNORMAL HIGH (ref 4.0–10.5)
nRBC: 0 % (ref 0.0–0.2)

## 2018-03-15 LAB — URINALYSIS, COMPLETE (UACMP) WITH MICROSCOPIC
Bilirubin Urine: NEGATIVE
Glucose, UA: NEGATIVE mg/dL
Ketones, ur: NEGATIVE mg/dL
Leukocytes, UA: NEGATIVE
Nitrite: NEGATIVE
Protein, ur: NEGATIVE mg/dL
Specific Gravity, Urine: 1.011 (ref 1.005–1.030)
pH: 7 (ref 5.0–8.0)

## 2018-03-15 LAB — COMPREHENSIVE METABOLIC PANEL
ALT: 57 U/L — ABNORMAL HIGH (ref 0–44)
AST: 104 U/L — ABNORMAL HIGH (ref 15–41)
Albumin: 3.7 g/dL (ref 3.5–5.0)
Alkaline Phosphatase: 114 U/L (ref 38–126)
Anion gap: 10 (ref 5–15)
BUN: 9 mg/dL (ref 6–20)
CO2: 26 mmol/L (ref 22–32)
Calcium: 9.1 mg/dL (ref 8.9–10.3)
Chloride: 101 mmol/L (ref 98–111)
Creatinine, Ser: 0.69 mg/dL (ref 0.44–1.00)
GFR calc Af Amer: >60 mL/min (ref >60)
GFR calc non Af Amer: >60 mL/min (ref >60)
Glucose, Bld: 174 mg/dL — ABNORMAL HIGH (ref 70–99)
Potassium: 3.7 mmol/L (ref 3.5–5.1)
Sodium: 137 mmol/L (ref 135–145)
Total Bilirubin: 0.6 mg/dL (ref 0.3–1.2)
Total Protein: 7.1 g/dL (ref 6.5–8.1)

## 2018-03-15 LAB — URINALYSIS, ROUTINE W REFLEX MICROSCOPIC
Bilirubin Urine: NEGATIVE
GLUCOSE, UA: NEGATIVE mg/dL
Ketones, ur: NEGATIVE mg/dL
Leukocytes, UA: NEGATIVE
NITRITE: NEGATIVE
Protein, ur: NEGATIVE mg/dL
Specific Gravity, Urine: 1.021 (ref 1.005–1.030)
pH: 7 (ref 5.0–8.0)

## 2018-03-15 LAB — GLUCOSE, CAPILLARY
Glucose-Capillary: 161 mg/dL — ABNORMAL HIGH (ref 70–99)
Glucose-Capillary: 148 mg/dL — ABNORMAL HIGH (ref 70–99)
Glucose-Capillary: 143 mg/dL — ABNORMAL HIGH (ref 70–99)

## 2018-03-15 LAB — TSH: TSH: 1.191 u[IU]/mL (ref 0.350–4.500)

## 2018-03-15 LAB — MAGNESIUM: Magnesium: 1.7 mg/dL (ref 1.7–2.4)

## 2018-03-15 LAB — I-STAT TROPONIN, ED: Troponin i, poc: 0.02 ng/mL (ref 0.00–0.08)

## 2018-03-15 MED ORDER — ONDANSETRON HCL 4 MG/2ML IJ SOLN
4.0000 mg | Freq: Four times a day (QID) | INTRAMUSCULAR | Status: DC | PRN
  Administered 2018-03-15 – 2018-03-17 (×4): 4 mg via INTRAVENOUS
  Filled 2018-03-15 (×5): qty 2

## 2018-03-15 MED ORDER — ONDANSETRON HCL 4 MG/2ML IJ SOLN
4.0000 mg | Freq: Once | INTRAMUSCULAR | Status: AC
  Administered 2018-03-15: 4 mg via INTRAVENOUS
  Filled 2018-03-15: qty 2

## 2018-03-15 MED ORDER — METOCLOPRAMIDE HCL 5 MG/ML IJ SOLN
10.0000 mg | Freq: Once | INTRAMUSCULAR | Status: AC
  Administered 2018-03-15: 10 mg via INTRAVENOUS
  Filled 2018-03-15: qty 2

## 2018-03-15 MED ORDER — LISINOPRIL 20 MG PO TABS
20.0000 mg | ORAL_TABLET | Freq: Every day | ORAL | Status: DC
  Administered 2018-03-16 – 2018-03-17 (×2): 20 mg via ORAL
  Filled 2018-03-15 (×2): qty 1

## 2018-03-15 MED ORDER — QUETIAPINE FUMARATE 25 MG PO TABS
50.0000 mg | ORAL_TABLET | Freq: Every day | ORAL | Status: DC
  Administered 2018-03-15 – 2018-03-16 (×2): 50 mg via ORAL
  Filled 2018-03-15 (×2): qty 1

## 2018-03-15 MED ORDER — INSULIN ASPART 100 UNIT/ML ~~LOC~~ SOLN: 0.0000 [IU] | Freq: Every day | SUBCUTANEOUS | Status: DC

## 2018-03-15 MED ORDER — HYDROMORPHONE HCL 1 MG/ML IJ SOLN
1.0000 mg | Freq: Once | INTRAMUSCULAR | Status: AC
  Administered 2018-03-15: 1 mg via INTRAVENOUS
  Filled 2018-03-15: qty 1

## 2018-03-15 MED ORDER — HYDRALAZINE HCL 20 MG/ML IJ SOLN: 10.0000 mg | INTRAMUSCULAR | Status: DC | PRN

## 2018-03-15 MED ORDER — OXYCODONE HCL 5 MG PO TABS
5.0000 mg | ORAL_TABLET | ORAL | Status: DC | PRN
  Administered 2018-03-15 – 2018-03-17 (×10): 5 mg via ORAL
  Filled 2018-03-15 (×10): qty 1

## 2018-03-15 MED ORDER — AMLODIPINE BESYLATE 10 MG PO TABS
10.0000 mg | ORAL_TABLET | Freq: Every day | ORAL | Status: DC
  Administered 2018-03-16 – 2018-03-17 (×2): 10 mg via ORAL
  Filled 2018-03-15 (×2): qty 1

## 2018-03-15 MED ORDER — AMLODIPINE BESYLATE 5 MG PO TABS
10.0000 mg | ORAL_TABLET | Freq: Once | ORAL | Status: AC
  Administered 2018-03-15: 10 mg via ORAL
  Filled 2018-03-15: qty 2

## 2018-03-15 MED ORDER — LISINOPRIL 20 MG PO TABS
20.0000 mg | ORAL_TABLET | Freq: Once | ORAL | Status: AC
  Administered 2018-03-15: 20 mg via ORAL
  Filled 2018-03-15: qty 1

## 2018-03-15 MED ORDER — HYDROCHLOROTHIAZIDE 25 MG PO TABS
25.0000 mg | ORAL_TABLET | Freq: Once | ORAL | Status: AC
  Administered 2018-03-15: 25 mg via ORAL
  Filled 2018-03-15: qty 1

## 2018-03-15 MED ORDER — DIPHENHYDRAMINE HCL 50 MG/ML IJ SOLN
25.0000 mg | Freq: Once | INTRAMUSCULAR | Status: AC
  Administered 2018-03-15: 25 mg via INTRAVENOUS
  Filled 2018-03-15: qty 1

## 2018-03-15 MED ORDER — IOHEXOL 300 MG/ML  SOLN
100.0000 mL | Freq: Once | INTRAMUSCULAR | Status: AC | PRN
  Administered 2018-03-15: 100 mL via INTRAVENOUS

## 2018-03-15 MED ORDER — HYDRALAZINE HCL 20 MG/ML IJ SOLN
10.0000 mg | Freq: Once | INTRAMUSCULAR | Status: AC
  Administered 2018-03-15: 10 mg via INTRAVENOUS
  Filled 2018-03-15: qty 1

## 2018-03-15 MED ORDER — SODIUM CHLORIDE 0.9 % IV BOLUS (SEPSIS)
1000.0000 mL | Freq: Once | INTRAVENOUS | Status: AC
  Administered 2018-03-15: 1000 mL via INTRAVENOUS

## 2018-03-15 MED ORDER — INSULIN ASPART 100 UNIT/ML ~~LOC~~ SOLN
0.0000 [IU] | Freq: Three times a day (TID) | SUBCUTANEOUS | Status: DC
  Administered 2018-03-15: 2 [IU] via SUBCUTANEOUS
  Administered 2018-03-15: 1 [IU] via SUBCUTANEOUS

## 2018-03-15 MED ORDER — METRONIDAZOLE 500 MG PO TABS
500.0000 mg | ORAL_TABLET | Freq: Three times a day (TID) | ORAL | Status: DC
  Administered 2018-03-15 – 2018-03-17 (×5): 500 mg via ORAL
  Filled 2018-03-15 (×5): qty 1

## 2018-03-15 MED ORDER — HYDROCHLOROTHIAZIDE 25 MG PO TABS
25.0000 mg | ORAL_TABLET | Freq: Every day | ORAL | Status: DC
  Administered 2018-03-16 – 2018-03-17 (×2): 25 mg via ORAL
  Filled 2018-03-15 (×2): qty 1

## 2018-03-15 MED ORDER — CIPROFLOXACIN HCL 500 MG PO TABS
500.0000 mg | ORAL_TABLET | Freq: Two times a day (BID) | ORAL | Status: DC
  Administered 2018-03-15 – 2018-03-17 (×4): 500 mg via ORAL
  Filled 2018-03-15 (×4): qty 1

## 2018-03-15 NOTE — ED Notes (Signed)
Returned from ct scan 

## 2018-03-15 NOTE — ED Provider Notes (Signed)
TIME SEEN: 5:21 AM  CHIEF COMPLAINT: Abdominal pain  HPI: Patient is a 43 year old female with history of hypertension, diabetes, obesity, colitis who presents to the emergency department with abdominal pain.  States diffuse sharp, crampy abdominal pain started on Thursday 30th and progressively worsening.  The next day she began having bloody diarrhea.  Was seen by her primary care provider and was told to come to the emergency department as she developed nausea and dizziness.  No vomiting.  She is status post BTL and cholecystectomy.  No recent travel, antibiotic use.  No sick contacts.  She denies history of ulcerative colitis or Crohn's disease.  Denies dysuria, hematuria, vaginal bleeding or discharge.  ROS: See HPI Constitutional: no fever  Eyes: no drainage  ENT: no runny nose   Cardiovascular:  no chest pain  Resp: no SOB  GI: no vomiting GU: no dysuria Integumentary: no rash  Allergy: no hives  Musculoskeletal: no leg swelling  Neurological: no slurred speech ROS otherwise negative  PAST MEDICAL HISTORY/PAST SURGICAL HISTORY:  Past Medical History:  Diagnosis Date  . Chest pain   . Colitis   . Hypertension   . Marijuana abuse   . Morbid obesity ([place])   . Panic attacks   . Type 2 diabetes mellitus with hyperlipidemia ([place]) 12/22/2016    MEDICATIONS:  Prior to Admission medications   Medication Sig Start Date End Date Taking? Authorizing Provider  FLUoxetine (PROZAC) 10 MG capsule Take 10 mg daily by mouth.    [provider]  lisinopril (PRINIVIL,ZESTRIL) 20 MG tablet Take 20 mg daily by mouth.    [provider]  ondansetron (ZOFRAN-ODT) 4 MG disintegrating tablet Take 1 tablet (4 mg total) by mouth every 6 (six) hours as needed for nausea. 02/22/18   Maczis, Barth Kirks, PA-C  oxyCODONE (OXY IR/ROXICODONE) 5 MG immediate release tablet Take 1 tablet (5 mg total) by mouth every 6 (six) hours as needed. 02/22/18   Maczis, Barth Kirks, PA-C  polyethylene  glycol (MIRALAX / GLYCOLAX) packet Take 17 g by mouth daily as needed for mild constipation or moderate constipation. 02/22/18   Maczis, Barth Kirks, PA-C  QUEtiapine (SEROQUEL) 50 MG tablet Take 50 mg by mouth at bedtime.    [provider]    ALLERGIES:  No Known Allergies  SOCIAL HISTORY:  Social History   Tobacco Use  . Smoking status: Never Smoker  . Smokeless tobacco: Never Used  Substance Use Topics  . Alcohol use: No    FAMILY HISTORY: Family History  Problem Relation Age of Onset  . Hypertension Other   . Diabetes Mellitus II Other   . Bipolar disorder Mother   . Other Father        unaware of his PMH    EXAM: BP (!) 210/124 (BP Location: Right Arm)   Pulse 86   Temp 98.4 F (36.9 C) (Oral)   Resp 20   LMP 02/27/2018   SpO2 97%  CONSTITUTIONAL: Alert and oriented and responds appropriately to questions.  Appears uncomfortable, hypertensive appears anxious HEAD: Normocephalic EYES: Conjunctivae clear, pupils appear equal, EOMI ENT: normal nose; moist mucous membranes NECK: Supple, no meningismus, no nuchal rigidity, no LAD  CARD: RRR; S1 and S2 appreciated; no murmurs, no clicks, no rubs, no gallops RESP: Normal chest excursion without splinting or tachypnea; breath sounds clear and equal bilaterally; no wheezes, no rhonchi, no rales, no hypoxia or respiratory distress, speaking full sentences ABD/GI: Normal bowel sounds; non-distended; soft, diffusely tender throughout the  abdomen especially throughout the lower abdomen, no rebound, no guarding, no peritoneal signs, no hepatosplenomegaly RECTAL:  Normal rectal tone, no gross blood or melena, no hemorrhoids appreciated, patient has diffuse tenderness on rectal exam, no fecal impaction BACK:  The back appears normal and is non-tender to palpation, there is no CVA tenderness EXT: Normal ROM in all joints; non-tender to palpation; no edema; normal capillary refill; no cyanosis, no calf tenderness or swelling     SKIN: Normal color for age and race; warm; no rash NEURO: Moves all extremities equally normal sensation diffusely, cranial nerves II through XII intact, normal speech PSYCH: The patient's mood and manner are appropriate. Grooming and personal hygiene are appropriate.  MEDICAL DECISION MAKING: Patient here with lower abdominal pain.  She is concerned that she could have colitis again.  Differential also includes appendicitis, diverticulitis, bowel obstruction, perforation.  She has no GU symptoms today.  She is quite tender throughout her lower abdomen.  She has a leukocytosis with left shift.  Afebrile.  Extremely hypertensive here which I think is secondary to pain and anxiety.  Will give Dilaudid for pain control, Zofran.  Will obtain CT of the abdomen pelvis.  ED PROGRESS: CT abdomen and pelvis shows no acute abnormality.  Patient continues to be very hypertensive which appears to be something that she does have intermittently although back into 2017.  She reports she has been compliant with her lisinopril, HCTZ and amlodipine over the past several days but does not always take it regularly.  States she last took these medications at 5:30 AM yesterday.  Will reorder her home medications today.  We will also give IV hydralazine.  She is complaining of a diffuse throbbing headache and states she has had frequent headaches similar to this in the past.  No vision changes.  No numbness or weakness.  Reports episode of sharp central chest pain and shortness of breath in the waiting room that have resolved.  Will obtain CT of her head given significant hypertension with headache.  Will also obtain EKG and troponin.  7:35 AM Signed out to Dr. Darl Householder who will follow up with patient CT head, EKG and troponin.  He will also reassess patient's blood pressure after meds.   I reviewed all nursing notes, vitals, pertinent previous records, EKGs, lab and urine results, imaging (as available).   CRITICAL  CARE Performed by: Pryor Curia   Total critical care time: 45 minutes  Critical care time was exclusive of separately billable procedures and treating other patients.  Critical care was necessary to treat or prevent imminent or life-threatening deterioration.  Critical care was time spent personally by me on the following activities: development of treatment plan with patient and/or surrogate as well as nursing, discussions with consultants, evaluation of patient's response to treatment, examination of patient, obtaining history from patient or surrogate, ordering and performing treatments and interventions, ordering and review of laboratory studies, ordering and review of radiographic studies, pulse oximetry and re-evaluation of patient's condition.    , Delice Bison, DO 03/15/18 [phone]

## 2018-03-15 NOTE — ED Notes (Signed)
Patient transported to CT 

## 2018-03-15 NOTE — ED Notes (Signed)
Patient transported to X-ray 

## 2018-03-15 NOTE — Progress Notes (Deleted)
Pt states she wants to rest since she didn't sleep in the ED. RN unable to complete admission assessment at this time.

## 2018-03-15 NOTE — Plan of Care (Signed)

## 2018-03-15 NOTE — H&P (Signed)
History and Physical  Cassandra Greer SEG:[id number] DOB: 21-Mar-1975 DOA: 03/14/2018  Referring physician: ER physician PCP: Aldona Bar, MD  Outpatient Specialists:    Patient coming from: Home  Chief Complaint: Elevated blood pressure, abdominal pain and diarrhea.  HPI: Patient is a 43 year old Caucasian female, with past medical history significant for diabetes mellitus type 2, panic attacks, hyperlipidemia, morbid obesity, hypertension, colitis, chest pain and marijuana abuse.  Apparently, patient's problem started 5 days prior to presentation with lower abdominal pain.  Patient developed bloody diarrhea the following day (4 days ago).  Patient was seen by the primary care provider, and was advised to come to the hospital for further assessment and management.  CT scan of the abdomen and pelvis with IV contrast done on presentation to the hospital was nonrevealing.  Leukocytosis of 15.5 was noted.  Last diarrhea stool was yesterday.  Patient reports feeling cold with sweats today, but no documented fever.  Associated nausea, headache and dizziness.  Dizziness was further described as "fuzzy feeling".  No neck pain, no shortness of breath, no urinary symptoms.  During evaluation in the ER, patient's systolic blood pressure was noted to be 210/124 mmHg, necessitating the call for the hospitalist team to observe patient in the hospital.  Blood pressure is currently down to 162/86 mmHg.  On further questioning, patient tells me that she was diagnosed with hypertension about a year ago, and takes Novast, lisinopril and a diuretic.  Further management will depend on hospital course.  ED Course: Patient has been worked up for abdominal pain and diarrhea.  Last diarrhea stool was last night.  Abdominal pain has improved significantly.  CT scan of the abdomen and pelvis is nonrevealing.  Patient has been treated with antihypertensives. Pertinent labs: Chemistry reveals sodium of 140, potassium of 4.2,  chloride 104, CO2 25, BUN of 12 and creatinine of 0.73 with blood sugar of 121.  AST is 15, ALT of 11 with albumin of 3.5.  Troponin of 0.02.  CBC reveals WBC of 15.5, hemoglobin of 11.2, hematocrit of 35.5, MCV of 85.3 platelet count of 444.  No repeat lab work for today. EKG: Independently reviewed.  Imaging: independently reviewed.   Review of Systems:  Negative for fever, visual changes, sore throat, rash, new muscle aches, chest pain, SOB, dysuria, bleeding.  Past Medical History:  Diagnosis Date  . Chest pain   . Colitis   . Hypertension   . Marijuana abuse   . Morbid obesity ([place])   . Panic attacks   . Type 2 diabetes mellitus with hyperlipidemia ([employer]) 12/22/2016    Past Surgical History:  Procedure Laterality Date  . CHOLECYSTECTOMY N/A 02/21/2018   Procedure: LAPAROSCOPIC CHOLECYSTECTOMY WITH INTRAOPERATIVE CHOLANGIOGRAM;  Surgeon: Donnie Mesa, MD;  Location: [facility];  Service: General;  Laterality: N/A;  . TUBAL LIGATION       reports that she has never smoked. She has never used smokeless tobacco. She reports current drug use. Drug: Marijuana. She reports that she does not drink alcohol.  No Known Allergies  Family History  Problem Relation Age of Onset  . Hypertension Other   . Diabetes Mellitus II Other   . Bipolar disorder Mother   . Other Father        unaware of his PMH     Prior to Admission medications   Medication Sig Start Date End Date Taking? Authorizing Provider  amLODipine (NORVASC) 5 MG tablet Take 10 mg by mouth daily.   Yes [provider]  hydrochlorothiazide (HYDRODIURIL) 25 MG tablet Take 25 mg by mouth daily.   Yes [provider]  lisinopril (PRINIVIL,ZESTRIL) 20 MG tablet Take 20 mg daily by mouth.   Yes [provider]  ondansetron (ZOFRAN-ODT) 4 MG disintegrating tablet Take 1 tablet (4 mg total) by mouth every 6 (six) hours as needed for nausea. 02/22/18  Yes Maczis, Barth Kirks, PA-C  oxyCODONE (OXY  IR/ROXICODONE) 5 MG immediate release tablet Take 1 tablet (5 mg total) by mouth every 6 (six) hours as needed. Patient taking differently: Take 5 mg by mouth every 6 (six) hours as needed for moderate pain.  02/22/18  Yes Maczis, Barth Kirks, PA-C  polyethylene glycol (MIRALAX / GLYCOLAX) packet Take 17 g by mouth daily as needed for mild constipation or moderate constipation. 02/22/18  Yes Maczis, Barth Kirks, PA-C  QUEtiapine (SEROQUEL) 50 MG tablet Take 50 mg by mouth at bedtime.   Yes [provider]    Physical Exam: Vitals:   03/15/18 0602 03/15/18 0645 03/15/18 0730 03/15/18 0815  BP: (!) 184/116 (!) 204/113 (!) 181/133 (!) 160/94  Pulse: 71 79 81 61  Resp:  16 16 16   Temp:      TempSrc:      SpO2: 93% 98% 96% 100%    Constitutional:  . Appears calm and comfortable.  Patient is obese. Eyes:  . No pallor. No jaundice.  ENMT:  . external ears, nose appear normal Neck:  . Neck is supple. No JVD Respiratory:  . CTA bilaterally, no w/r/r.  . Respiratory effort normal. No retractions or accessory muscle use Cardiovascular:  . S1S2 . No LE extremity edema   Abdomen:  . Abdomen is morbidly obese, vague discomfort and soft.  Organs are difficult to assess. Neurologic:  . Awake and alert. . Moves all limbs.  Wt Readings from Last 3 Encounters:  02/20/18 110.2 kg  02/21/17 113.4 kg  12/24/16 111.3 kg    I have personally reviewed following labs and imaging studies  Labs on Admission:  CBC: Recent Labs  Lab 03/14/18 1829  WBC 15.5*  HGB 11.2*  HCT 35.5*  MCV 85.3  PLT 585*   Basic Metabolic Panel: Recent Labs  Lab 03/14/18 1829  NA 140  K 4.2  CL 104  CO2 25  GLUCOSE 121*  BUN 12  CREATININE 0.73  CALCIUM 9.0   Liver Function Tests: Recent Labs  Lab 03/14/18 1829  AST 15  ALT 11  ALKPHOS 79  BILITOT 0.4  PROT 6.6  ALBUMIN 3.5   Recent Labs  Lab 03/14/18 1829  LIPASE 31   No results for input(s): AMMONIA in the last 168  hours. Coagulation Profile: No results for input(s): INR, PROTIME in the last 168 hours. Cardiac Enzymes: No results for input(s): CKTOTAL, CKMB, CKMBINDEX, TROPONINI in the last 168 hours. BNP (last 3 results) No results for input(s): PROBNP in the last 8760 hours. HbA1C: No results for input(s): HGBA1C in the last 72 hours. CBG: No results for input(s): GLUCAP in the last 168 hours. Lipid Profile: No results for input(s): CHOL, HDL, LDLCALC, TRIG, CHOLHDL, LDLDIRECT in the last 72 hours. Thyroid Function Tests: No results for input(s): TSH, T4TOTAL, FREET4, T3FREE, THYROIDAB in the last 72 hours. Anemia Panel: No results for input(s): VITAMINB12, FOLATE, FERRITIN, TIBC, IRON, RETICCTPCT in the last 72 hours. Urine analysis:    Component Value Date/Time   COLORURINE YELLOW 03/15/2018 0043   APPEARANCEUR HAZY (A) 03/15/2018 0043   LABSPEC 1.021 03/15/2018 0043  PHURINE 7.0 03/15/2018 0043   GLUCOSEU NEGATIVE 03/15/2018 0043   HGBUR SMALL (A) 03/15/2018 0043   BILIRUBINUR NEGATIVE 03/15/2018 0043   KETONESUR NEGATIVE 03/15/2018 0043   PROTEINUR NEGATIVE 03/15/2018 0043   UROBILINOGEN 1.0 01/09/2014 1806   NITRITE NEGATIVE 03/15/2018 0043   LEUKOCYTESUR NEGATIVE 03/15/2018 0043   Sepsis Labs: @LABRCNTIP (procalcitonin:4,lacticidven:4) )No results found for this or any previous visit (from the past 240 hour(s)).    Radiological Exams on Admission: Dg Chest 2 View  Result Date: 03/15/2018 CLINICAL DATA:  Chest pain and shortness of breath EXAM: CHEST - 2 VIEW COMPARISON:  February 20, 2018 FINDINGS: Lungs are clear. Heart size and pulmonary vascularity are normal. No adenopathy. No pneumothorax. No bone lesions. IMPRESSION: No edema or consolidation. Electronically Signed   By: Lowella Grip III M.D.   On: 03/15/2018 08:00   Ct Head Wo Contrast  Result Date: 03/15/2018 CLINICAL DATA:  Right frontotemporal headache.  Hypertension. EXAM: CT HEAD WITHOUT CONTRAST TECHNIQUE:  Contiguous axial images were obtained from the base of the skull through the vertex without intravenous contrast. Note that contrast was administered earlier in the day for CT abdomen and pelvis. COMPARISON:  None. FINDINGS: Brain: Ventricles are normal in size and configuration. There is no intracranial mass, hemorrhage, extra-axial fluid collection, or midline shift. Brain parenchyma appears unremarkable. No evident acute infarct. Vascular: No hyperdense vessel appreciable. There is no appreciable vascular calcification. Skull: The bony calvarium appears intact. Sinuses/Orbits: There is mucosal thickening in several ethmoid air cells. Other paranasal sinuses are clear. Orbits appear symmetric bilaterally. Other: There is opacification in several mastoid air cells inferiorly on each side. Mastoids elsewhere clear. IMPRESSION: Mucosal thickening in several ethmoid air cells. Inferior mastoid air cell disease bilaterally. Study otherwise unremarkable. Electronically Signed   By: Lowella Grip III M.D.   On: 03/15/2018 08:17   Ct Abdomen Pelvis W Contrast  Result Date: 03/15/2018 CLINICAL DATA:  Lower abdominal pain, nausea and diarrhea, 6 days duration. EXAM: CT ABDOMEN AND PELVIS WITH CONTRAST TECHNIQUE: Multidetector CT imaging of the abdomen and pelvis was performed using the standard protocol following bolus administration of intravenous contrast. CONTRAST:  17mL OMNIPAQUE IOHEXOL 300 MG/ML  SOLN COMPARISON:  Ultrasound 02/20/2018.  CT 12/22/2016 FINDINGS: Lower chest: Normal Hepatobiliary: Liver parenchyma is normal. Previous cholecystectomy. Pancreas: Normal Spleen: Normal Adrenals/Urinary Tract: Adrenal glands are normal. Kidneys are normal. Bladder is normal. Stomach/Bowel: Stomach, small intestine and large intestine all appear normal. No evidence of ileus, obstruction, inflammatory disease or mass. Specifically, no evidence of diverticulitis or diverticulosis. Vascular/Lymphatic: Early  atherosclerotic changes of the aorta and iliac arteries. IVC is normal. No retroperitoneal adenopathy. Reproductive: Normal Other: No free fluid or air. Musculoskeletal: Bilateral sacroiliac degenerative changes. Lumbar spine appears negative. IMPRESSION: No cause of the presenting symptoms is identified. No evidence bowel pathology. Aortic and iliac atherosclerosis, premature for age. Bilateral sacroiliac osteoarthritis. Electronically Signed   By: Nelson Chimes M.D.   On: 03/15/2018 06:49    EKG: Independently reviewed.   Active Problems:   * No active hospital problems. *   Assessment/Plan Hypertensive urgency: Gradually optimize. Resume home medications. Further management will depend on hospital course.  Diarrhea/abdominal pain: Resolving. Continue to monitor supportively. Repeat labs for today.  Leukocytosis: This could be reactive. Repeat CBC today.  Diabetes mellitus: Monitor blood sugar. Optimize.  DVT prophylaxis: SCD Code Status: Full Family Communication:  Disposition Plan: Home in the morning Consults called: None Admission status: Observation  Time spent: 65 minutes  Dana Allan,  MD  [facility] Pager #: [phone] 7PM-7AM contact night coverage as above  03/15/2018, 9:04 AM

## 2018-03-16 ENCOUNTER — Observation Stay (HOSPITAL_COMMUNITY): Payer: Medicaid Other

## 2018-03-16 DIAGNOSIS — Z8249 Family history of ischemic heart disease and other diseases of the circulatory system: Secondary | ICD-10-CM | POA: Diagnosis not present

## 2018-03-16 DIAGNOSIS — R1032 Left lower quadrant pain: Secondary | ICD-10-CM

## 2018-03-16 DIAGNOSIS — R109 Unspecified abdominal pain: Secondary | ICD-10-CM | POA: Diagnosis present

## 2018-03-16 DIAGNOSIS — R42 Dizziness and giddiness: Secondary | ICD-10-CM | POA: Diagnosis present

## 2018-03-16 DIAGNOSIS — E1169 Type 2 diabetes mellitus with other specified complication: Secondary | ICD-10-CM | POA: Diagnosis present

## 2018-03-16 DIAGNOSIS — R112 Nausea with vomiting, unspecified: Secondary | ICD-10-CM | POA: Diagnosis present

## 2018-03-16 DIAGNOSIS — F410 Panic disorder [episodic paroxysmal anxiety] without agoraphobia: Secondary | ICD-10-CM | POA: Diagnosis present

## 2018-03-16 DIAGNOSIS — R197 Diarrhea, unspecified: Secondary | ICD-10-CM | POA: Diagnosis present

## 2018-03-16 DIAGNOSIS — I160 Hypertensive urgency: Secondary | ICD-10-CM | POA: Diagnosis present

## 2018-03-16 DIAGNOSIS — Z833 Family history of diabetes mellitus: Secondary | ICD-10-CM | POA: Diagnosis not present

## 2018-03-16 DIAGNOSIS — K760 Fatty (change of) liver, not elsewhere classified: Secondary | ICD-10-CM | POA: Diagnosis present

## 2018-03-16 DIAGNOSIS — E785 Hyperlipidemia, unspecified: Secondary | ICD-10-CM | POA: Diagnosis present

## 2018-03-16 DIAGNOSIS — D72829 Elevated white blood cell count, unspecified: Secondary | ICD-10-CM | POA: Diagnosis present

## 2018-03-16 DIAGNOSIS — E876 Hypokalemia: Secondary | ICD-10-CM | POA: Diagnosis present

## 2018-03-16 DIAGNOSIS — Z6838 Body mass index (BMI) 38.0-38.9, adult: Secondary | ICD-10-CM | POA: Diagnosis not present

## 2018-03-16 DIAGNOSIS — I10 Essential (primary) hypertension: Secondary | ICD-10-CM

## 2018-03-16 DIAGNOSIS — R1084 Generalized abdominal pain: Secondary | ICD-10-CM | POA: Diagnosis not present

## 2018-03-16 DIAGNOSIS — I159 Secondary hypertension, unspecified: Secondary | ICD-10-CM | POA: Diagnosis not present

## 2018-03-16 DIAGNOSIS — R51 Headache: Secondary | ICD-10-CM | POA: Diagnosis present

## 2018-03-16 DIAGNOSIS — Z9049 Acquired absence of other specified parts of digestive tract: Secondary | ICD-10-CM | POA: Diagnosis not present

## 2018-03-16 DIAGNOSIS — Z79899 Other long term (current) drug therapy: Secondary | ICD-10-CM | POA: Diagnosis not present

## 2018-03-16 LAB — BASIC METABOLIC PANEL
Anion gap: 9 (ref 5–15)
BUN: 7 mg/dL (ref 6–20)
CO2: 28 mmol/L (ref 22–32)
Calcium: 9.2 mg/dL (ref 8.9–10.3)
Chloride: 101 mmol/L (ref 98–111)
Creatinine, Ser: 0.82 mg/dL (ref 0.44–1.00)
GFR calc Af Amer: >60 mL/min (ref >60)
GFR calc non Af Amer: >60 mL/min (ref >60)
Glucose, Bld: 98 mg/dL (ref 70–99)
Potassium: 3.2 mmol/L — ABNORMAL LOW (ref 3.5–5.1)
Sodium: 138 mmol/L (ref 135–145)

## 2018-03-16 LAB — GLUCOSE, CAPILLARY
Glucose-Capillary: 113 mg/dL — ABNORMAL HIGH (ref 70–99)
Glucose-Capillary: 95 mg/dL (ref 70–99)
Glucose-Capillary: 112 mg/dL — ABNORMAL HIGH (ref 70–99)
Glucose-Capillary: 138 mg/dL — ABNORMAL HIGH (ref 70–99)

## 2018-03-16 LAB — CBC
HCT: 37.2 % (ref 36.0–46.0)
Hemoglobin: 11.5 g/dL — ABNORMAL LOW (ref 12.0–15.0)
MCH: 25.8 pg — ABNORMAL LOW (ref 26.0–34.0)
MCHC: 30.9 g/dL (ref 30.0–36.0)
MCV: 83.6 fL (ref 80.0–100.0)
Platelets: 470 10*3/uL — ABNORMAL HIGH (ref 150–400)
RBC: 4.45 MIL/uL (ref 3.87–5.11)
RDW: 14.3 % (ref 11.5–15.5)
WBC: 18.3 10*3/uL — ABNORMAL HIGH (ref 4.0–10.5)
nRBC: 0 % (ref 0.0–0.2)

## 2018-03-16 MED ORDER — SODIUM CHLORIDE 0.9 % IV SOLN
INTRAVENOUS | Status: AC
  Administered 2018-03-16: 13:00:00 via INTRAVENOUS

## 2018-03-16 MED ORDER — ONDANSETRON HCL 4 MG/2ML IJ SOLN
4.0000 mg | INTRAMUSCULAR | Status: AC
  Administered 2018-03-16 – 2018-03-17 (×4): 4 mg via INTRAVENOUS
  Filled 2018-03-16 (×3): qty 2

## 2018-03-16 MED ORDER — MAGNESIUM SULFATE 2 GM/50ML IV SOLN
2.0000 g | Freq: Once | INTRAVENOUS | Status: AC
  Administered 2018-03-16: 2 g via INTRAVENOUS
  Filled 2018-03-16: qty 50

## 2018-03-16 MED ORDER — HYDROXYZINE HCL 25 MG PO TABS: 25.0000 mg | ORAL_TABLET | Freq: Three times a day (TID) | ORAL | Status: DC | PRN

## 2018-03-16 MED ORDER — POTASSIUM CHLORIDE CRYS ER 20 MEQ PO TBCR
40.0000 meq | EXTENDED_RELEASE_TABLET | ORAL | Status: AC
  Administered 2018-03-16 (×2): 40 meq via ORAL
  Filled 2018-03-16 (×2): qty 2

## 2018-03-16 NOTE — Progress Notes (Addendum)
[facility]/Trauma Progress Note      Assessment/Plan  Abdominal pain and bloody diarrhea - recommend GI panel and C. Diff - may need GI consult in setting of bloody diarrhea  S/P lap chole with IOC, 01/13, Dr. Georgette Dover - no signs of bile leak on [place] or CT - CT scan of the abdomen and pelvis and RUQ [place] is nonrevealing. - no elevation in Tbili and upon arrival LFT's were WNL - do not believe the pt's symptoms are related to recent lap chole  Plan: No surgical intervention indicated at this time. Please page [place] if you have any further needs or concerns.    LOS: 0 days    Subjective: CC: abdominal pain and diarrhea  Patient is a 43 year old Caucasian female, with past medical history significant for diabetes mellitus type 2, panic attacks, hyperlipidemia, morbid obesity, hypertension, colitis, chest pain and marijuana abuse who presented to ED yesterday. Pt states she was feeling good since her lap chole until last Thursday when she developed crampy, intermittent, lower abdominal pain with associated bloody, foul smelling, watery diarrhea and nausea. She saw her PCP who recommended a GI follow up but then told pt to come to the ED. She is feeling better today. No vomiting.   Objective: Vital signs in last 24 hours: Temp:  [97.8 F (36.6 C)-99.1 F (37.3 C)] 98.2 F (36.8 C) (02/05 1225) Pulse Rate:  [74-90] 90 (02/05 1225) Resp:  [16-20] 18 (02/05 1225) BP: (110-141)/(62-97) 129/92 (02/05 1225) SpO2:  [94 %-99 %] 94 % (02/05 1225) Last BM Date: 03/15/18  Intake/Output from previous day: No intake/output data recorded. Intake/Output this shift: No intake/output data recorded.  PE: Gen:  Alert, NAD, pleasant, cooperative Card:  RRR, no M/G/R heard Pulm:  CTA, no W/R/R, rate and effort normal Abd: Soft, ND, obese, +BS, port sites are well healed, lower abdominal pain with mild guarding, no peritonitis  Skin: no rashes noted, warm and  dry   Anti-infectives: Anti-infectives (From admission, onward)   Start     Dose/Rate Route Frequency Ordered Stop   03/15/18 2000  ciprofloxacin (CIPRO) tablet 500 mg     500 mg Oral 2 times daily 03/15/18 1722     03/15/18 1815  metroNIDAZOLE (FLAGYL) tablet 500 mg     500 mg Oral Every 8 hours 03/15/18 1722        Lab Results:  Recent Labs    03/15/18 1205 03/16/18 0150  WBC 14.5* 18.3*  HGB 11.5* 11.5*  HCT 36.9 37.2  PLT 454* 470*   BMET Recent Labs    03/15/18 1205 03/16/18 0150  NA 137 138  K 3.7 3.2*  CL 101 101  CO2 26 28  GLUCOSE 174* 98  BUN 9 7  CREATININE 0.69 0.82  CALCIUM 9.1 9.2   PT/INR No results for input(s): LABPROT, INR in the last 72 hours. CMP     Component Value Date/Time   NA 138 03/16/2018 0150   K 3.2 (L) 03/16/2018 0150   CL 101 03/16/2018 0150   CO2 28 03/16/2018 0150   GLUCOSE 98 03/16/2018 0150   BUN 7 03/16/2018 0150   CREATININE 0.82 03/16/2018 0150   CALCIUM 9.2 03/16/2018 0150   PROT 7.1 03/15/2018 1205   ALBUMIN 3.7 03/15/2018 1205   AST 104 (H) 03/15/2018 1205   ALT 57 (H) 03/15/2018 1205   ALKPHOS 114 03/15/2018 1205   BILITOT 0.6 03/15/2018 1205   GFRNONAA >60 03/16/2018 0150   GFRAA >60 03/16/2018  0150   Lipase     Component Value Date/Time   LIPASE 31 03/14/2018 1829    Studies/Results: Dg Chest 2 View  Result Date: 03/15/2018 CLINICAL DATA:  Chest pain and shortness of breath EXAM: CHEST - 2 VIEW COMPARISON:  February 20, 2018 FINDINGS: Lungs are clear. Heart size and pulmonary vascularity are normal. No adenopathy. No pneumothorax. No bone lesions. IMPRESSION: No edema or consolidation. Electronically Signed   By: Lowella Grip III M.D.   On: 03/15/2018 08:00   Ct Head Wo Contrast  Result Date: 03/15/2018 CLINICAL DATA:  Right frontotemporal headache.  Hypertension. EXAM: CT HEAD WITHOUT CONTRAST TECHNIQUE: Contiguous axial images were obtained from the base of the skull through the vertex without  intravenous contrast. Note that contrast was administered earlier in the day for CT abdomen and pelvis. COMPARISON:  None. FINDINGS: Brain: Ventricles are normal in size and configuration. There is no intracranial mass, hemorrhage, extra-axial fluid collection, or midline shift. Brain parenchyma appears unremarkable. No evident acute infarct. Vascular: No hyperdense vessel appreciable. There is no appreciable vascular calcification. Skull: The bony calvarium appears intact. Sinuses/Orbits: There is mucosal thickening in several ethmoid air cells. Other paranasal sinuses are clear. Orbits appear symmetric bilaterally. Other: There is opacification in several mastoid air cells inferiorly on each side. Mastoids elsewhere clear. IMPRESSION: Mucosal thickening in several ethmoid air cells. Inferior mastoid air cell disease bilaterally. Study otherwise unremarkable. Electronically Signed   By: Lowella Grip III M.D.   On: 03/15/2018 08:17   Ct Abdomen Pelvis W Contrast  Result Date: 03/15/2018 CLINICAL DATA:  Lower abdominal pain, nausea and diarrhea, 6 days duration. EXAM: CT ABDOMEN AND PELVIS WITH CONTRAST TECHNIQUE: Multidetector CT imaging of the abdomen and pelvis was performed using the standard protocol following bolus administration of intravenous contrast. CONTRAST:  157mL OMNIPAQUE IOHEXOL 300 MG/ML  SOLN COMPARISON:  Ultrasound 02/20/2018.  CT 12/22/2016 FINDINGS: Lower chest: Normal Hepatobiliary: Liver parenchyma is normal. Previous cholecystectomy. Pancreas: Normal Spleen: Normal Adrenals/Urinary Tract: Adrenal glands are normal. Kidneys are normal. Bladder is normal. Stomach/Bowel: Stomach, small intestine and large intestine all appear normal. No evidence of ileus, obstruction, inflammatory disease or mass. Specifically, no evidence of diverticulitis or diverticulosis. Vascular/Lymphatic: Early atherosclerotic changes of the aorta and iliac arteries. IVC is normal. No retroperitoneal adenopathy.  Reproductive: Normal Other: No free fluid or air. Musculoskeletal: Bilateral sacroiliac degenerative changes. Lumbar spine appears negative. IMPRESSION: No cause of the presenting symptoms is identified. No evidence bowel pathology. Aortic and iliac atherosclerosis, premature for age. Bilateral sacroiliac osteoarthritis. Electronically Signed   By: Nelson Chimes M.D.   On: 03/15/2018 06:49   US Abdomen Limited Ruq  Result Date: 03/16/2018 CLINICAL DATA:  Right upper quadrant pain, 6 days duration. Previous cholecystectomy. EXAM: ULTRASOUND ABDOMEN LIMITED RIGHT UPPER QUADRANT COMPARISON:  CT yesterday. FINDINGS: Gallbladder: Surgically absent. Common bile duct: Diameter: 3.7 mm.  Normal. Liver: Slightly increased echogenicity suggesting mild fatty change. No focal lesion or ductal dilatation. Portal vein is patent on color Doppler imaging with normal direction of blood flow towards the liver. IMPRESSION: Negative other than mild fatty change of the liver. Previous cholecystectomy. Electronically Signed   By: Nelson Chimes M.D.   On: 03/16/2018 11:05      Kalman Drape , [facility] 03/16/2018, 1:02 PM  Pager: [phone] Mon-Wed, Friday 7:00am-4:30pm Thurs 7am-11:30am  Consults: [phone]

## 2018-03-16 NOTE — Progress Notes (Signed)
[facility] PROGRESS NOTE  Cassandra Greer JYN:[id number] DOB: 1975-07-24 DOA: 03/14/2018 PCP: Aldona Bar, MD  Assessment/Plan: Hypertensive urgency: resolved this am. Recently patient not consistent with anti-hypertensive meds since surgery 3 weeks ago and pain contributing as well.  -continue home medications. -monitor -close OP follow up  Diarrhea/abdominal pain: Lap chole 02/22/18.  Hx colitis. no further loose stools but continues with abdominal pain. Worse after eating. Nausea with vomiting as well this am. Emesis yellowish. RUQ [place] negative other than mild fatty change of the liver -cipro and flagyl day #2. -scheduled zofran -clear liquid diet -IV fluids -stool study. -general surgery consult  Leukocytosis/thrombocytosis: both trending up somewhat. Chart review indicates hx of same but not to current degree.  ? Reactive. No fever and patient non-toxic appearing. .  Diabetes mellitus: serum glucose 174 on admission. 98 this am. Diet controlled -SSI for optimal control -obtain HgA1c.  Hypokalemia/hypomagnesemia. Related to #1.  -replete -recheck in am  Code Status: full Family Communication: none present Disposition Plan: home hopefully tomorrow   Consultants:  General surgery  Procedures:    Antibiotics:  cipro 03/15/18>>  Flagyl 03/15/18>>  HPI/Subjective: Sitting in chair dry heaving. Reports continued pain/nausea worse with eating.   43 yo admitted intractable abdominal pain/nausea/diarrhea. No further diarrhea. Eating makes pain/nausea worse. Lap chole 3 weeks ago.   Objective: Vitals:   03/16/18 0909 03/16/18 1225  BP: 136/89 (!) 129/92  Pulse: 86 90  Resp:  18  Temp: 98.4 F (36.9 C) 98.2 F (36.8 C)  SpO2: 97% 94%   No intake or output data in the 24 hours ending 03/16/18 1257 There were no vitals filed for this visit.  Exam:   General:  Obese sitting in chair dry heaving  Cardiovascular: rrr no mgr no LE  edema  Respiratory: normal effort BS clear no wheeze  Abdomen: obese soft +BS no guarding no rebounding.   Musculoskeletal: joints without swelling/erythema   Data Reviewed: Basic Metabolic Panel: Recent Labs  Lab 03/14/18 1829 03/15/18 1205 03/16/18 0150  NA 140 137 138  K 4.2 3.7 3.2*  CL 104 101 101  CO2 25 26 28   GLUCOSE 121* 174* 98  BUN 12 9 7   CREATININE 0.73 0.69 0.82  CALCIUM 9.0 9.1 9.2  MG  --  1.7  --    Liver Function Tests: Recent Labs  Lab 03/14/18 1829 03/15/18 1205  AST 15 104*  ALT 11 57*  ALKPHOS 79 114  BILITOT 0.4 0.6  PROT 6.6 7.1  ALBUMIN 3.5 3.7   Recent Labs  Lab 03/14/18 1829  LIPASE 31   No results for input(s): AMMONIA in the last 168 hours. CBC: Recent Labs  Lab 03/14/18 1829 03/15/18 1205 03/16/18 0150  WBC 15.5* 14.5* 18.3*  NEUTROABS  --  12.4*  --   HGB 11.2* 11.5* 11.5*  HCT 35.5* 36.9 37.2  MCV 85.3 82.9 83.6  PLT 444* 454* 470*   Cardiac Enzymes: No results for input(s): CKTOTAL, CKMB, CKMBINDEX, TROPONINI in the last 168 hours. BNP (last 3 results) No results for input(s): BNP in the last 8760 hours.  ProBNP (last 3 results) No results for input(s): PROBNP in the last 8760 hours.  CBG: Recent Labs  Lab 03/15/18 1245 03/15/18 1641 03/15/18 2224 03/16/18 0626 03/16/18 1145  GLUCAP 161* 148* 143* 113* 95    No results found for this or any previous visit (from the past 240 hour(s)).   Studies: Dg Chest 2 View  Result Date: 03/15/2018 CLINICAL  DATA:  Chest pain and shortness of breath EXAM: CHEST - 2 VIEW COMPARISON:  February 20, 2018 FINDINGS: Lungs are clear. Heart size and pulmonary vascularity are normal. No adenopathy. No pneumothorax. No bone lesions. IMPRESSION: No edema or consolidation. Electronically Signed   By: Lowella Grip III M.D.   On: 03/15/2018 08:00   Ct Head Wo Contrast  Result Date: 03/15/2018 CLINICAL DATA:  Right frontotemporal headache.  Hypertension. EXAM: CT HEAD WITHOUT  CONTRAST TECHNIQUE: Contiguous axial images were obtained from the base of the skull through the vertex without intravenous contrast. Note that contrast was administered earlier in the day for CT abdomen and pelvis. COMPARISON:  None. FINDINGS: Brain: Ventricles are normal in size and configuration. There is no intracranial mass, hemorrhage, extra-axial fluid collection, or midline shift. Brain parenchyma appears unremarkable. No evident acute infarct. Vascular: No hyperdense vessel appreciable. There is no appreciable vascular calcification. Skull: The bony calvarium appears intact. Sinuses/Orbits: There is mucosal thickening in several ethmoid air cells. Other paranasal sinuses are clear. Orbits appear symmetric bilaterally. Other: There is opacification in several mastoid air cells inferiorly on each side. Mastoids elsewhere clear. IMPRESSION: Mucosal thickening in several ethmoid air cells. Inferior mastoid air cell disease bilaterally. Study otherwise unremarkable. Electronically Signed   By: Lowella Grip III M.D.   On: 03/15/2018 08:17   Ct Abdomen Pelvis W Contrast  Result Date: 03/15/2018 CLINICAL DATA:  Lower abdominal pain, nausea and diarrhea, 6 days duration. EXAM: CT ABDOMEN AND PELVIS WITH CONTRAST TECHNIQUE: Multidetector CT imaging of the abdomen and pelvis was performed using the standard protocol following bolus administration of intravenous contrast. CONTRAST:  144mL OMNIPAQUE IOHEXOL 300 MG/ML  SOLN COMPARISON:  Ultrasound 02/20/2018.  CT 12/22/2016 FINDINGS: Lower chest: Normal Hepatobiliary: Liver parenchyma is normal. Previous cholecystectomy. Pancreas: Normal Spleen: Normal Adrenals/Urinary Tract: Adrenal glands are normal. Kidneys are normal. Bladder is normal. Stomach/Bowel: Stomach, small intestine and large intestine all appear normal. No evidence of ileus, obstruction, inflammatory disease or mass. Specifically, no evidence of diverticulitis or diverticulosis. Vascular/Lymphatic:  Early atherosclerotic changes of the aorta and iliac arteries. IVC is normal. No retroperitoneal adenopathy. Reproductive: Normal Other: No free fluid or air. Musculoskeletal: Bilateral sacroiliac degenerative changes. Lumbar spine appears negative. IMPRESSION: No cause of the presenting symptoms is identified. No evidence bowel pathology. Aortic and iliac atherosclerosis, premature for age. Bilateral sacroiliac osteoarthritis. Electronically Signed   By: Nelson Chimes M.D.   On: 03/15/2018 06:49   US Abdomen Limited Ruq  Result Date: 03/16/2018 CLINICAL DATA:  Right upper quadrant pain, 6 days duration. Previous cholecystectomy. EXAM: ULTRASOUND ABDOMEN LIMITED RIGHT UPPER QUADRANT COMPARISON:  CT yesterday. FINDINGS: Gallbladder: Surgically absent. Common bile duct: Diameter: 3.7 mm.  Normal. Liver: Slightly increased echogenicity suggesting mild fatty change. No focal lesion or ductal dilatation. Portal vein is patent on color Doppler imaging with normal direction of blood flow towards the liver. IMPRESSION: Negative other than mild fatty change of the liver. Previous cholecystectomy. Electronically Signed   By: Nelson Chimes M.D.   On: 03/16/2018 11:05    Scheduled Meds: . amLODipine  10 mg Oral Daily  . ciprofloxacin  500 mg Oral BID  . hydrochlorothiazide  25 mg Oral Daily  . insulin aspart  0-5 Units Subcutaneous QHS  . insulin aspart  0-9 Units Subcutaneous TID WC  . lisinopril  20 mg Oral Daily  . metroNIDAZOLE  500 mg Oral Q8H  . ondansetron (ZOFRAN) IV  4 mg Intravenous Q4H  . potassium chloride  40  mEq Oral Q4H  . QUEtiapine  50 mg Oral QHS  . sodium chloride flush  3 mL Intravenous Once   Continuous Infusions: . sodium chloride    . magnesium sulfate 1 - 4 g bolus IVPB      Principal Problem:   Abdominal pain Active Problems:   Type 2 diabetes mellitus with hyperlipidemia (HCC)   Hypertensive urgency   Hypokalemia   Fatty liver disease, nonalcoholic   Thrombocytosis (HCC)    Hypomagnesemia   Panic disorder (episodic paroxysmal anxiety)    Time spent: 45 minutes    Easley NP  [facility]  If 7PM-7AM, please contact night-coverage at [email or web address], password [facility] 03/16/2018, 12:57 PM  LOS: 0 days

## 2018-03-17 DIAGNOSIS — I159 Secondary hypertension, unspecified: Secondary | ICD-10-CM

## 2018-03-17 LAB — BASIC METABOLIC PANEL
Anion gap: 13 (ref 5–15)
BUN: 9 mg/dL (ref 6–20)
CO2: 23 mmol/L (ref 22–32)
Calcium: 8.9 mg/dL (ref 8.9–10.3)
Chloride: 103 mmol/L (ref 98–111)
Creatinine, Ser: 0.94 mg/dL (ref 0.44–1.00)
GFR calc Af Amer: >60 mL/min (ref >60)
GFR calc non Af Amer: >60 mL/min (ref >60)
Glucose, Bld: 137 mg/dL — ABNORMAL HIGH (ref 70–99)
Potassium: 3.5 mmol/L (ref 3.5–5.1)
Sodium: 139 mmol/L (ref 135–145)

## 2018-03-17 LAB — CBC
HCT: 37.2 % (ref 36.0–46.0)
Hemoglobin: 11.9 g/dL — ABNORMAL LOW (ref 12.0–15.0)
MCH: 26.9 pg (ref 26.0–34.0)
MCHC: 32 g/dL (ref 30.0–36.0)
MCV: 84.2 fL (ref 80.0–100.0)
Platelets: 449 10*3/uL — ABNORMAL HIGH (ref 150–400)
RBC: 4.42 MIL/uL (ref 3.87–5.11)
RDW: 14.4 % (ref 11.5–15.5)
WBC: 13.2 10*3/uL — ABNORMAL HIGH (ref 4.0–10.5)
nRBC: 0 % (ref 0.0–0.2)

## 2018-03-17 LAB — GLUCOSE, CAPILLARY
Glucose-Capillary: 96 mg/dL (ref 70–99)
Glucose-Capillary: 161 mg/dL — ABNORMAL HIGH (ref 70–99)

## 2018-03-17 LAB — HEMOGLOBIN A1C
Hgb A1c MFr Bld: 6.8 % — ABNORMAL HIGH (ref 4.8–5.6)
Mean Plasma Glucose: 148.46 mg/dL

## 2018-03-17 MED ORDER — HYDROCHLOROTHIAZIDE 25 MG PO TABS: 12.5000 mg | ORAL_TABLET | Freq: Every day | ORAL | 1 refills | Status: DC

## 2018-03-17 MED ORDER — HYDROXYZINE HCL 25 MG PO TABS: 25.0000 mg | ORAL_TABLET | Freq: Three times a day (TID) | ORAL | 0 refills | Status: DC | PRN

## 2018-03-17 MED ORDER — CIPROFLOXACIN HCL 500 MG PO TABS: 500.0000 mg | ORAL_TABLET | Freq: Two times a day (BID) | ORAL | 0 refills | Status: AC

## 2018-03-17 MED ORDER — POLYETHYLENE GLYCOL 3350 17 G PO PACK: 17.0000 g | PACK | Freq: Every day | ORAL | 0 refills | Status: AC | PRN

## 2018-03-17 MED ORDER — OXYCODONE HCL 5 MG PO TABS: 5.0000 mg | ORAL_TABLET | Freq: Four times a day (QID) | ORAL | 0 refills | Status: DC | PRN

## 2018-03-17 MED ORDER — METRONIDAZOLE 500 MG PO TABS: 500.0000 mg | ORAL_TABLET | Freq: Three times a day (TID) | ORAL | 0 refills | Status: AC

## 2018-03-17 MED ORDER — ONDANSETRON 4 MG PO TBDP: 4.0000 mg | ORAL_TABLET | Freq: Four times a day (QID) | ORAL | 0 refills | Status: DC | PRN

## 2018-03-17 NOTE — Discharge Instructions (Signed)
1) soft diet advised, advance diet as tolerated 2) follow-up with your general surgeon and primary care physician as previously advised for recheck 3) take medications as prescribed

## 2018-03-17 NOTE — Discharge Summary (Signed)
Cassandra Greer, is a 43 y.o. female  DOB [birth year]  MRN [id number].  Admission date:  03/14/2018  Admitting Physician  Bonnell Public, MD  Discharge Date:  03/17/2018   Primary MD  Aldona Bar, MD  Recommendations for primary care physician for things to follow:   1) soft diet advised, advance diet as tolerated 2) follow-up with your general surgeon and primary care physician as previously advised for recheck 3) take medications as prescribed    Admission Diagnosis  Generalized abdominal pain [R10.84] Essential hypertension [I10] Diarrhea, unspecified type [R19.7]   Discharge Diagnosis  Generalized abdominal pain [R10.84] Essential hypertension [I10] Diarrhea, unspecified type [R19.7]    Principal Problem:   Abdominal pain Active Problems:   HTN (hypertension)   Hypokalemia   Fatty liver disease, nonalcoholic   Thrombocytosis (HCC)   Panic disorder (episodic paroxysmal anxiety)   Type 2 diabetes mellitus with hyperlipidemia ([place])   Hypertensive urgency   Hypomagnesemia      Past Medical History:  Diagnosis Date  . Chest pain   . Colitis   . Hypertension   . Marijuana abuse   . Morbid obesity ([place])   . Panic attacks   . Type 2 diabetes mellitus with hyperlipidemia ([place]) 12/22/2016    Past Surgical History:  Procedure Laterality Date  . CHOLECYSTECTOMY N/A 02/21/2018   Procedure: LAPAROSCOPIC CHOLECYSTECTOMY WITH INTRAOPERATIVE CHOLANGIOGRAM;  Surgeon: Donnie Mesa, MD;  Location: [facility];  Service: General;  Laterality: N/A;  . TUBAL LIGATION         HPI  from the history and physical done on the day of admission:    Patient coming from: Home  Chief Complaint: Elevated blood pressure, abdominal pain and diarrhea.  HPI: Patient is a 43 year old Caucasian female, with past medical history significant for diabetes mellitus type 2, panic attacks, hyperlipidemia,  morbid obesity, hypertension, colitis, chest pain and marijuana abuse.  Apparently, patient's problem started 5 days prior to presentation with lower abdominal pain.  Patient developed bloody diarrhea the following day (4 days ago).  Patient was seen by the primary care provider, and was advised to come to the hospital for further assessment and management.  CT scan of the abdomen and pelvis with IV contrast done on presentation to the hospital was nonrevealing.  Leukocytosis of 15.5 was noted.  Last diarrhea stool was yesterday.  Patient reports feeling cold with sweats today, but no documented fever.  Associated nausea, headache and dizziness.  Dizziness was further described as "fuzzy feeling".  No neck pain, no shortness of breath, no urinary symptoms.  During evaluation in the ER, patient's systolic blood pressure was noted to be 210/124 mmHg, necessitating the call for the hospitalist team to observe patient in the hospital.  Blood pressure is currently down to 162/86 mmHg.  On further questioning, patient tells me that she was diagnosed with hypertension about a year ago, and takes Novast, lisinopril and a diuretic.  Further management will depend on hospital course.  ED Course: Patient has been worked up  for abdominal pain and diarrhea.  Last diarrhea stool was last night.  Abdominal pain has improved significantly.  CT scan of the abdomen and pelvis is nonrevealing.  Patient has been treated with antihypertensives. Pertinent labs: Chemistry reveals sodium of 140, potassium of 4.2, chloride 104, CO2 25, BUN of 12 and creatinine of 0.73 with blood sugar of 121.  AST is 15, ALT of 11 with albumin of 3.5.  Troponin of 0.02.  CBC reveals WBC of 15.5, hemoglobin of 11.2, hematocrit of 35.5, MCV of 85.3 platelet count of 444.  No repeat lab work for today. EKG: Independently reviewed.  Imaging: independently reviewed.    Hospital Course:      1) abdominal pain ---S/P lap chole with IOC, 01/13, Dr.  Georgette Dover -- no signs of bile leak on [place] or CT--- - CT scan of the abdomen and pelvis and RUQ [place] is nonrevealing. - no elevation in Tbili and upon arrival LFT's were WNL --Tolerating GI soft diet well,,,,,, no diarrhea or bowel movements since admission... Elevated WBC noted query reactive, review of records reveals history of prior elevated WBC, white count is down to 13.2 from 18.3 yesterday, no fevers no chills-- General surgery consult appreciated, may discharge home on Cipro and Flagyl if symptoms persist may need outpatient GI follow-up  2)HTN--- stable, resume preadmission medications, decrease HCTZ to 12.5 mg daily  3) diabetes mellitus----diet controlled, A1c 6.8, no medication changes  4) panic disorder----stable, continue Seroquel nightly, may use hydroxyzine as needed for anxiety  Discharge Condition: stable  Follow UP--- general surgery and PCP as advised    Consults obtained - Gen surgery  Diet and Activity recommendation:  As advised  Discharge Instructions    Discharge Instructions    Call MD for:  difficulty breathing, headache or visual disturbances   Complete by:  As directed    Call MD for:  persistant dizziness or light-headedness   Complete by:  As directed    Call MD for:  persistant nausea and vomiting   Complete by:  As directed    Call MD for:  redness, tenderness, or signs of infection (pain, swelling, redness, odor or green/yellow discharge around incision site)   Complete by:  As directed    Call MD for:  severe uncontrolled pain   Complete by:  As directed    Call MD for:  temperature >100.4   Complete by:  As directed    Diet - low sodium heart healthy   Complete by:  As directed    Discharge instructions   Complete by:  As directed    1) soft diet advised, advance diet as tolerated 2) follow-up with your general surgeon and primary care physician as previously advised for recheck 3) take medications as prescribed   Increase activity slowly    Complete by:  As directed         Discharge Medications     Allergies as of 03/17/2018   No Known Allergies     Medication List    TAKE these medications   amLODipine 5 MG tablet Commonly known as:  NORVASC Take 10 mg by mouth daily.   ciprofloxacin 500 MG tablet Commonly known as:  CIPRO Take 1 tablet (500 mg total) by mouth 2 (two) times daily for 4 days.   hydrochlorothiazide 25 MG tablet Commonly known as:  HYDRODIURIL Take 0.5 tablets (12.5 mg total) by mouth daily. What changed:  how much to take   hydrOXYzine 25 MG tablet Commonly known  as:  ATARAX/VISTARIL Take 1 tablet (25 mg total) by mouth 3 (three) times daily as needed for anxiety.   lisinopril 20 MG tablet Commonly known as:  PRINIVIL,ZESTRIL Take 20 mg daily by mouth.   metroNIDAZOLE 500 MG tablet Commonly known as:  FLAGYL Take 1 tablet (500 mg total) by mouth 3 (three) times daily for 4 days.   ondansetron 4 MG disintegrating tablet Commonly known as:  ZOFRAN-ODT Take 1 tablet (4 mg total) by mouth every 6 (six) hours as needed for nausea or vomiting. What changed:  reasons to take this   oxyCODONE 5 MG immediate release tablet Commonly known as:  Oxy IR/ROXICODONE Take 1 tablet (5 mg total) by mouth every 6 (six) hours as needed. What changed:  reasons to take this   polyethylene glycol packet Commonly known as:  MIRALAX / GLYCOLAX Take 17 g by mouth daily as needed for mild constipation or moderate constipation.   QUEtiapine 50 MG tablet Commonly known as:  SEROQUEL Take 50 mg by mouth at bedtime.       Major procedures and Radiology Reports - PLEASE review detailed and final reports for all details, in brief -   Dg Chest 2 View  Result Date: 03/15/2018 CLINICAL DATA:  Chest pain and shortness of breath EXAM: CHEST - 2 VIEW COMPARISON:  February 20, 2018 FINDINGS: Lungs are clear. Heart size and pulmonary vascularity are normal. No adenopathy. No pneumothorax. No bone lesions.  IMPRESSION: No edema or consolidation. Electronically Signed   By: Lowella Grip III M.D.   On: 03/15/2018 08:00   Dg Chest 2 View  Result Date: 02/20/2018 CLINICAL DATA:  Left chest pain radiating to the central chest and left shoulder since yesterday. Shortness of breath worse on exertion. Nausea. Cough. Double vision and seeing great spots. EXAM: CHEST - 2 VIEW COMPARISON:  03/11/2017 FINDINGS: The heart size and mediastinal contours are within normal limits. Both lungs are clear. The visualized skeletal structures are unremarkable. IMPRESSION: No active cardiopulmonary disease. Electronically Signed   By: Lucienne Capers M.D.   On: 02/20/2018 01:52   Dg Cholangiogram Operative  Result Date: 02/21/2018 CLINICAL DATA:  Laparoscopic cholecystectomy and gallstones EXAM: INTRAOPERATIVE CHOLANGIOGRAM TECHNIQUE: Cholangiographic images from the C-arm fluoroscopic device were submitted for interpretation post-operatively. Please see the procedural report for the amount of contrast and the fluoroscopy time utilized. COMPARISON:  None. FINDINGS: Contrast fills the biliary tree and duodenum without filling defects in the common bile duct. IMPRESSION: Patent biliary tree. Electronically Signed   By: Marybelle Killings M.D.   On: 02/21/2018 08:44   Ct Head Wo Contrast  Result Date: 03/15/2018 CLINICAL DATA:  Right frontotemporal headache.  Hypertension. EXAM: CT HEAD WITHOUT CONTRAST TECHNIQUE: Contiguous axial images were obtained from the base of the skull through the vertex without intravenous contrast. Note that contrast was administered earlier in the day for CT abdomen and pelvis. COMPARISON:  None. FINDINGS: Brain: Ventricles are normal in size and configuration. There is no intracranial mass, hemorrhage, extra-axial fluid collection, or midline shift. Brain parenchyma appears unremarkable. No evident acute infarct. Vascular: No hyperdense vessel appreciable. There is no appreciable vascular calcification.  Skull: The bony calvarium appears intact. Sinuses/Orbits: There is mucosal thickening in several ethmoid air cells. Other paranasal sinuses are clear. Orbits appear symmetric bilaterally. Other: There is opacification in several mastoid air cells inferiorly on each side. Mastoids elsewhere clear. IMPRESSION: Mucosal thickening in several ethmoid air cells. Inferior mastoid air cell disease bilaterally. Study otherwise unremarkable. Electronically  Signed   By: Lowella Grip III M.D.   On: 03/15/2018 08:17   Ct Abdomen Pelvis W Contrast  Result Date: 03/15/2018 CLINICAL DATA:  Lower abdominal pain, nausea and diarrhea, 6 days duration. EXAM: CT ABDOMEN AND PELVIS WITH CONTRAST TECHNIQUE: Multidetector CT imaging of the abdomen and pelvis was performed using the standard protocol following bolus administration of intravenous contrast. CONTRAST:  152mL OMNIPAQUE IOHEXOL 300 MG/ML  SOLN COMPARISON:  Ultrasound 02/20/2018.  CT 12/22/2016 FINDINGS: Lower chest: Normal Hepatobiliary: Liver parenchyma is normal. Previous cholecystectomy. Pancreas: Normal Spleen: Normal Adrenals/Urinary Tract: Adrenal glands are normal. Kidneys are normal. Bladder is normal. Stomach/Bowel: Stomach, small intestine and large intestine all appear normal. No evidence of ileus, obstruction, inflammatory disease or mass. Specifically, no evidence of diverticulitis or diverticulosis. Vascular/Lymphatic: Early atherosclerotic changes of the aorta and iliac arteries. IVC is normal. No retroperitoneal adenopathy. Reproductive: Normal Other: No free fluid or air. Musculoskeletal: Bilateral sacroiliac degenerative changes. Lumbar spine appears negative. IMPRESSION: No cause of the presenting symptoms is identified. No evidence bowel pathology. Aortic and iliac atherosclerosis, premature for age. Bilateral sacroiliac osteoarthritis. Electronically Signed   By: Nelson Chimes M.D.   On: 03/15/2018 06:49   US Abdomen Limited  Result Date:  02/20/2018 CLINICAL DATA:  Right upper quadrant pain. EXAM: ULTRASOUND ABDOMEN LIMITED RIGHT UPPER QUADRANT COMPARISON:  02/21/2017 FINDINGS: Gallbladder: Gallbladder is contracted with stones and sludge visible. Murphy's sign is positive. Mild gallbladder wall thickening. Common bile duct: Diameter: 2.4 mm, normal Liver: Increased parenchymal echotexture suggesting diffuse fatty infiltration. No focal lesions identified. Portal vein is patent on color Doppler imaging with normal direction of blood flow towards the liver. IMPRESSION: Contracted gallbladder with cholelithiasis and positive Murphy's sign. Changes are consistent with acute cholecystitis in the appropriate clinical setting. Electronically Signed   By: Lucienne Capers M.D.   On: 02/20/2018 01:45   US Abdomen Limited Ruq  Result Date: 03/16/2018 CLINICAL DATA:  Right upper quadrant pain, 6 days duration. Previous cholecystectomy. EXAM: ULTRASOUND ABDOMEN LIMITED RIGHT UPPER QUADRANT COMPARISON:  CT yesterday. FINDINGS: Gallbladder: Surgically absent. Common bile duct: Diameter: 3.7 mm.  Normal. Liver: Slightly increased echogenicity suggesting mild fatty change. No focal lesion or ductal dilatation. Portal vein is patent on color Doppler imaging with normal direction of blood flow towards the liver. IMPRESSION: Negative other than mild fatty change of the liver. Previous cholecystectomy. Electronically Signed   By: Nelson Chimes M.D.   On: 03/16/2018 11:05    Micro Results   No results found for this or any previous visit (from the past 240 hour(s)).     Today   Subjective    Lexine Moor today has no concerns, tolerating GI soft diet well, no fevers, no chills, no further emesis, abdominal pain is better, no further BM since admission, eager to go home         Patient has been seen and examined prior to discharge   Objective   Blood pressure (!) 111/96, pulse 86, temperature 98.6 F (37 C), temperature source Oral, resp. rate  18, height 5\' 5"  (1.651 m), weight 103.7 kg, last menstrual period 02/27/2018, SpO2 97 %.   Intake/Output Summary (Last 24 hours) at 03/17/2018 1150 Last data filed at 03/17/2018 1000 Gross per 24 hour  Intake 801 ml  Output -  Net 801 ml    Exam Gen:- Awake Alert, no acute distress  HEENT:- [place].AT, No sclera icterus Neck-Supple Neck,No JVD,.  Lungs-  CTAB , good air movement bilaterally  CV-  S1, S2 normal, regular Abd-  +ve B.Sounds, Abd Soft, postop wounds are clean dry and intact, no significant tenderness at this time, Extremity/Skin:- No  edema,   good pulses Psych-affect is appropriate, oriented x3 Neuro-no new focal deficits, no tremors    Data Review   CBC w Diff:  Lab Results  Component Value Date   WBC 13.2 (H) 03/17/2018   HGB 11.9 (L) 03/17/2018   HCT 37.2 03/17/2018   PLT 449 (H) 03/17/2018   LYMPHOPCT 8 03/15/2018   MONOPCT 5 03/15/2018   EOSPCT 0 03/15/2018   BASOPCT 0 03/15/2018    CMP:  Lab Results  Component Value Date   NA 139 03/17/2018   K 3.5 03/17/2018   CL 103 03/17/2018   CO2 23 03/17/2018   BUN 9 03/17/2018   CREATININE 0.94 03/17/2018   PROT 7.1 03/15/2018   ALBUMIN 3.7 03/15/2018   BILITOT 0.6 03/15/2018   ALKPHOS 114 03/15/2018   AST 104 (H) 03/15/2018   ALT 57 (H) 03/15/2018  .   Total Discharge time is about 33 minutes  Roxan Hockey M.D on 03/17/2018 at 11:50 AM  Go to [email or web address] -  for contact info  [facility] - Office  [phone]

## 2018-03-17 NOTE — Progress Notes (Signed)
Patient ready for discharge. 

## 2018-03-17 NOTE — Plan of Care (Signed)
  Problem: Education: Goal: Knowledge of General Education information will improve Description Including pain rating scale, medication(s)/side effects and non-pharmacologic comfort measures Outcome: Progressing   Problem: Health Behavior/Discharge Planning: Goal: Ability to manage health-related needs will improve Outcome: Progressing   

## 2018-03-17 NOTE — Plan of Care (Signed)
  Problem: Education: Goal: Knowledge of General Education information will improve Description Including pain rating scale, medication(s)/side effects and non-pharmacologic comfort measures 03/17/2018 1148 by Tristan Schroeder, RN Outcome: Adequate for Discharge 03/17/2018 1122 by Tristan Schroeder, RN Outcome: Progressing   Problem: Health Behavior/Discharge Planning: Goal: Ability to manage health-related needs will improve 03/17/2018 1148 by Tristan Schroeder, RN Outcome: Adequate for Discharge 03/17/2018 1122 by Tristan Schroeder, RN Outcome: Progressing

## 2018-09-28 ENCOUNTER — Encounter (HOSPITAL_COMMUNITY): Payer: Self-pay | Admitting: Emergency Medicine

## 2018-09-28 ENCOUNTER — Other Ambulatory Visit: Payer: Self-pay

## 2018-09-28 ENCOUNTER — Emergency Department (HOSPITAL_COMMUNITY)
Admission: EM | Admit: 2018-09-28 | Discharge: 2018-09-29 | Disposition: A | Discharge status: Home / Self Care | Payer: Self-pay | Attending: Emergency Medicine | Admitting: Emergency Medicine

## 2018-09-28 DIAGNOSIS — R112 Nausea with vomiting, unspecified: Secondary | ICD-10-CM | POA: Insufficient documentation

## 2018-09-28 DIAGNOSIS — R197 Diarrhea, unspecified: Secondary | ICD-10-CM | POA: Insufficient documentation

## 2018-09-28 DIAGNOSIS — R1084 Generalized abdominal pain: Secondary | ICD-10-CM | POA: Insufficient documentation

## 2018-09-28 DIAGNOSIS — E119 Type 2 diabetes mellitus without complications: Secondary | ICD-10-CM | POA: Insufficient documentation

## 2018-09-28 DIAGNOSIS — K644 Residual hemorrhoidal skin tags: Secondary | ICD-10-CM | POA: Insufficient documentation

## 2018-09-28 DIAGNOSIS — Z79899 Other long term (current) drug therapy: Secondary | ICD-10-CM | POA: Insufficient documentation

## 2018-09-28 DIAGNOSIS — I10 Essential (primary) hypertension: Secondary | ICD-10-CM | POA: Insufficient documentation

## 2018-09-28 LAB — COMPREHENSIVE METABOLIC PANEL
ALT: 18 U/L (ref 0–44)
AST: 16 U/L (ref 15–41)
Albumin: 3.5 g/dL (ref 3.5–5.0)
Alkaline Phosphatase: 88 U/L (ref 38–126)
Anion gap: 12 (ref 5–15)
BUN: 10 mg/dL (ref 6–20)
CO2: 23 mmol/L (ref 22–32)
Calcium: 9 mg/dL (ref 8.9–10.3)
Chloride: 100 mmol/L (ref 98–111)
Creatinine, Ser: 1.03 mg/dL — ABNORMAL HIGH (ref 0.44–1.00)
GFR calc Af Amer: >60 mL/min (ref >60)
GFR calc non Af Amer: >60 mL/min (ref >60)
Glucose, Bld: 179 mg/dL — ABNORMAL HIGH (ref 70–99)
Potassium: 3.1 mmol/L — ABNORMAL LOW (ref 3.5–5.1)
Sodium: 135 mmol/L (ref 135–145)
Total Bilirubin: 0.5 mg/dL (ref 0.3–1.2)
Total Protein: 6.7 g/dL (ref 6.5–8.1)

## 2018-09-28 LAB — URINALYSIS, ROUTINE W REFLEX MICROSCOPIC
Bilirubin Urine: NEGATIVE
Glucose, UA: NEGATIVE mg/dL
Ketones, ur: NEGATIVE mg/dL
Leukocytes,Ua: NEGATIVE
Nitrite: NEGATIVE
Protein, ur: NEGATIVE mg/dL
Specific Gravity, Urine: 1.015 (ref 1.005–1.030)
pH: 5 (ref 5.0–8.0)

## 2018-09-28 LAB — CBC
HCT: 37.2 % (ref 36.0–46.0)
Hemoglobin: 11.8 g/dL — ABNORMAL LOW (ref 12.0–15.0)
MCH: 27.1 pg (ref 26.0–34.0)
MCHC: 31.7 g/dL (ref 30.0–36.0)
MCV: 85.5 fL (ref 80.0–100.0)
Platelets: 489 10*3/uL — ABNORMAL HIGH (ref 150–400)
RBC: 4.35 MIL/uL (ref 3.87–5.11)
RDW: 15.1 % (ref 11.5–15.5)
WBC: 18.1 10*3/uL — ABNORMAL HIGH (ref 4.0–10.5)
nRBC: 0 % (ref 0.0–0.2)

## 2018-09-28 LAB — I-STAT BETA HCG BLOOD, ED (MC, WL, AP ONLY): I-stat hCG, quantitative: <5 m[IU]/mL (ref <5)

## 2018-09-28 LAB — LIPASE, BLOOD: Lipase: 33 U/L (ref 11–51)

## 2018-09-28 MED ORDER — SODIUM CHLORIDE 0.9% FLUSH
3.0000 mL | Freq: Once | INTRAVENOUS | Status: AC
  Administered 2018-09-29: 3 mL via INTRAVENOUS

## 2018-09-28 NOTE — ED Triage Notes (Signed)
Pt c/o abdominal pain x 2 weeks. States symptoms started with nausea/vomiting and have progressed to diarrhea.

## 2018-09-29 ENCOUNTER — Emergency Department (HOSPITAL_COMMUNITY): Payer: Self-pay

## 2018-09-29 LAB — WET PREP, GENITAL
Clue Cells Wet Prep HPF POC: NONE SEEN
Sperm: NONE SEEN
Trich, Wet Prep: NONE SEEN
Yeast Wet Prep HPF POC: NONE SEEN

## 2018-09-29 LAB — POC OCCULT BLOOD, ED: Fecal Occult Bld: NEGATIVE

## 2018-09-29 MED ORDER — CEFTRIAXONE SODIUM 250 MG IJ SOLR
250.0000 mg | Freq: Once | INTRAMUSCULAR | Status: AC
  Administered 2018-09-29: 10:00:00 250 mg via INTRAMUSCULAR
  Filled 2018-09-29: qty 250

## 2018-09-29 MED ORDER — PROMETHAZINE HCL 25 MG/ML IJ SOLN
12.5000 mg | Freq: Once | INTRAMUSCULAR | Status: AC
  Administered 2018-09-29: 10:00:00 12.5 mg via INTRAVENOUS
  Filled 2018-09-29: qty 1

## 2018-09-29 MED ORDER — MORPHINE SULFATE (PF) 4 MG/ML IV SOLN
INTRAVENOUS | Status: AC
  Filled 2018-09-29: qty 1

## 2018-09-29 MED ORDER — ONDANSETRON HCL 4 MG/2ML IJ SOLN
INTRAMUSCULAR | Status: AC
  Administered 2018-09-29: 4 mg
  Filled 2018-09-29: qty 2

## 2018-09-29 MED ORDER — LIDOCAINE HCL (PF) 1 % IJ SOLN
INTRAMUSCULAR | Status: AC
  Administered 2018-09-29: 10:00:00 1 mL
  Filled 2018-09-29: qty 5

## 2018-09-29 MED ORDER — HYDROMORPHONE HCL 1 MG/ML IJ SOLN
INTRAMUSCULAR | Status: AC
  Administered 2018-09-29: 0.5 mg
  Filled 2018-09-29: qty 1

## 2018-09-29 MED ORDER — AZITHROMYCIN 250 MG PO TABS
1000.0000 mg | ORAL_TABLET | Freq: Once | ORAL | Status: AC
  Administered 2018-09-29: 10:00:00 1000 mg via ORAL
  Filled 2018-09-29: qty 4

## 2018-09-29 MED ORDER — IOHEXOL 300 MG/ML  SOLN
100.0000 mL | Freq: Once | INTRAMUSCULAR | Status: AC | PRN
  Administered 2018-09-29: 100 mL via INTRAVENOUS

## 2018-09-29 MED ORDER — HYDROMORPHONE HCL 1 MG/ML IJ SOLN
0.5000 mg | Freq: Once | INTRAMUSCULAR | Status: AC
  Administered 2018-09-29: 0.5 mg via INTRAVENOUS
  Filled 2018-09-29: qty 1

## 2018-09-29 MED ORDER — ONDANSETRON HCL 4 MG/2ML IJ SOLN
4.0000 mg | Freq: Once | INTRAMUSCULAR | Status: AC
  Administered 2018-09-29: 07:00:00 4 mg via INTRAVENOUS
  Filled 2018-09-29: qty 2

## 2018-09-29 NOTE — Discharge Instructions (Addendum)
There are many causes of abdominal pain. Most pain is not serious and goes away, but some pain gets worse, changes, or will not go away. Please return to the emergency department or see your doctor right away if you (or your family member) experience any of the following:   Pain that gets worse or moves to just one spot.  Pain that gets worse if you cough or sneeze.  Pain with going over a bump in the road.  Pain that does not get better in 24 hours.  Inability to keep down liquids (vomiting)--especially if you are making less urine.  Fainting.  Blood in the vomit or stool.  High fever or shaking chills.  Swelling of the abdomen.  Any new or worsening problem.   Follow-up Instructions  I have included information for [facility]. Please call to schedule follow up appointment if needed. Call the women's clinic for follow up Medications  -Continue usual home medications. -You have prescription for Zofran at home.  He can take this as needed for nausea.   Additional Instructions  No alcohol.  No caffeine, aspirin, or cigarettes.

## 2018-09-29 NOTE — ED Notes (Signed)
Pt states she has had abd pain, N/V/D for 3 weeks after having sexual intercourse. Pt also states this normally happens after sexual intercourse, but typically goes away within 2-3 days.

## 2018-09-29 NOTE — ED Notes (Signed)
Patient transported to Ultrasound 

## 2018-09-29 NOTE — ED Notes (Signed)
Pt tolerating ice chips and water PO well w/o N/V

## 2018-09-29 NOTE — ED Notes (Signed)
Pt in ultrasound at this time

## 2018-09-29 NOTE — ED Provider Notes (Signed)
Care assumed from B. Morelli- PA-C.  Please see his full H&P.  In short,  Cassandra Greer is a 43 y.o. female presents for abdominal pain x3 weeks with associated nausea, vomiting, diarrhea.  Work-up started by previous provider includes labs and CT abdomen that shows slight heterogeneity of cervix with pericervical stranding.  Labs remarkable for leukocytosis of 18.1, fecal occult negative, beta-hCG negative.  Gonorrhea and chlamydia test pending.  Pelvic exam also performed and was without cervical motion tenderness, bleeding, or discharge.  [place] pending, if negative plan to discharge home with symptomatic treatment.  Physical Exam  BP 113/84   Pulse 83   Temp 98.2 F (36.8 C) (Oral)   Resp 18   LMP 08/28/2018   SpO2 96%   Physical Exam  PE: Constitutional: well-developed, well-nourished, no apparent distress HENT: normocephalic, atraumatic. no cervical adenopathy Cardiovascular: normal rate and rhythm, distal pulses intact Pulmonary/Chest: effort normal; breath sounds clear and equal bilaterally; no wheezes or rales Abdominal: soft.  Generalized tenderness, no guarding or rebound. no peritoneal signs. Musculoskeletal: full ROM, no edema Neurological: alert with goal directed thinking Skin: warm and dry, no rash, no diaphoresis Psychiatric: normal mood and affect, normal behavior    ED Course/Procedures   Clinical Course as of Sep 28 1024  Thu Sep 29, 2018  2409 CT abdomen pelvis: Slight heterogenicity of the cervix with pericervical stranding at approximately 3:00-could reflect a cervicitis correlate with pelvic symptoms and exam findings, could consider pelvic ultrasound--few hypoattenuating foci within the cervix, likely nabothian cysts.  Normal uterus, normal ovaries.--No other acute abnormalities in the abdomen or pelvis.--Normal appendix.--Diverticulosis, no diverticulitis. -Dr. Marguerita Merles   [BM]    Clinical Course User Index [BM] Deliah Boston, PA-C    Results for  orders placed or performed during the hospital encounter of 09/28/18 (from the past 24 hour(s))  Urinalysis, Routine w reflex microscopic     Status: Abnormal   Collection Time: 09/28/18  8:12 PM  Result Value Ref Range   Color, Urine YELLOW YELLOW   APPearance CLOUDY (A) CLEAR   Specific Gravity, Urine 1.015 1.005 - 1.030   pH 5.0 5.0 - 8.0   Glucose, UA NEGATIVE NEGATIVE mg/dL   Hgb urine dipstick SMALL (A) NEGATIVE   Bilirubin Urine NEGATIVE NEGATIVE   Ketones, ur NEGATIVE NEGATIVE mg/dL   Protein, ur NEGATIVE NEGATIVE mg/dL   Nitrite NEGATIVE NEGATIVE   Leukocytes,Ua NEGATIVE NEGATIVE   RBC / HPF 0-5 0 - 5 RBC/hpf   WBC, UA 0-5 0 - 5 WBC/hpf   Bacteria, UA RARE (A) NONE SEEN   Squamous Epithelial / LPF 21-50 0 - 5   Mucus PRESENT    Hyaline Casts, UA PRESENT   Lipase, blood     Status: None   Collection Time: 09/28/18  8:27 PM  Result Value Ref Range   Lipase 33 11 - 51 U/L  Comprehensive metabolic panel     Status: Abnormal   Collection Time: 09/28/18  8:27 PM  Result Value Ref Range   Sodium 135 135 - 145 mmol/L   Potassium 3.1 (L) 3.5 - 5.1 mmol/L   Chloride 100 98 - 111 mmol/L   CO2 23 22 - 32 mmol/L   Glucose, Bld 179 (H) 70 - 99 mg/dL   BUN 10 6 - 20 mg/dL   Creatinine, Ser 1.03 (H) 0.44 - 1.00 mg/dL   Calcium 9.0 8.9 - 10.3 mg/dL   Total Protein 6.7 6.5 - 8.1 g/dL   Albumin 3.5 3.5 -  5.0 g/dL   AST 16 15 - 41 U/L   ALT 18 0 - 44 U/L   Alkaline Phosphatase 88 38 - 126 U/L   Total Bilirubin 0.5 0.3 - 1.2 mg/dL   GFR calc non Af Amer >60 >60 mL/min   GFR calc Af Amer >60 >60 mL/min   Anion gap 12 5 - 15  CBC     Status: Abnormal   Collection Time: 09/28/18  8:27 PM  Result Value Ref Range   WBC 18.1 (H) 4.0 - 10.5 K/uL   RBC 4.35 3.87 - 5.11 MIL/uL   Hemoglobin 11.8 (L) 12.0 - 15.0 g/dL   HCT 37.2 36.0 - 46.0 %   MCV 85.5 80.0 - 100.0 fL   MCH 27.1 26.0 - 34.0 pg   MCHC 31.7 30.0 - 36.0 g/dL   RDW 15.1 11.5 - 15.5 %   Platelets 489 (H) 150 - 400 K/uL    nRBC 0.0 0.0 - 0.2 %  I-Stat beta hCG blood, ED     Status: None   Collection Time: 09/28/18  8:39 PM  Result Value Ref Range   I-stat hCG, quantitative <5.0 <5 mIU/mL   Comment 3          POC occult blood, ED     Status: None   Collection Time: 09/29/18  1:37 AM  Result Value Ref Range   Fecal Occult Bld NEGATIVE NEGATIVE  Wet prep, genital     Status: Abnormal   Collection Time: 09/29/18  6:14 AM   Specimen: Cervical/Vaginal swab  Result Value Ref Range   Yeast Wet Prep HPF POC NONE SEEN NONE SEEN   Trich, Wet Prep NONE SEEN NONE SEEN   Clue Cells Wet Prep HPF POC NONE SEEN NONE SEEN   WBC, Wet Prep HPF POC FEW (A) NONE SEEN   Sperm NONE SEEN    TRANSABDOMINAL AND TRANSVAGINAL ULTRASOUND OF PELVIS    DOPPLER ULTRASOUND OF OVARIES    TECHNIQUE:  Both transabdominal and transvaginal ultrasound examinations of the  pelvis were performed. Transabdominal technique was performed for  global imaging of the pelvis including uterus, ovaries, adnexal  regions, and pelvic cul-de-sac.    It was necessary to proceed with endovaginal exam following the  transabdominal exam to visualize the endometrium and adnexa. Color  and duplex Doppler ultrasound was utilized to evaluate blood flow to  the ovaries.    COMPARISON: 09/29/2026 CT abdomen/pelvis.    FINDINGS:  Uterus    Measurements: 9.4 x 4.7 x 5.9 cm = volume: 135 mL. Anteverted uterus  is normal in configuration, with no uterine fibroids. Tiny anterior  uterine body myometrial cyst measuring 3 x 5 x 3 mm.    Endometrium    Thickness: 14 mm. No endometrial cavity fluid or focal endometrial  mass.    Right ovary    Measurements: 2.3 x 2.9 x 2.5 cm = volume: 8.5 mL. Normal  appearance/no adnexal mass.    Left ovary    Measurements: 2.9 x 4.1 x 2.2 cm = volume: 13.8 mL. Normal  appearance/no adnexal mass.    Pulsed Doppler evaluation of both ovaries demonstrates normal  low-resistance arterial and venous  waveforms.    Other findings    No abnormal free fluid.    IMPRESSION:  1. No acute abnormality. Normal ovaries. No evidence of adnexal  torsion.  2. No uterine fibroids. Tiny anterior uterine body myometrial cyst,  nonspecific, cannot exclude mild uterine adenomyosis.  Electronically Signed  By: Ilona Sorrel M.D.  On: 09/29/2018 10:22     MDM   43 year old female presents with generalized abdominal pain x3 weeks.  She is overall well-appearing, no acute distress.  She is afebrile and normotensive.  On exam she has generalized abdominal tenderness, no peritoneal signs.  Work-up by previous provider during included labs, CT abdomen pelvis, pelvic exam, ultrasound.  Labs were significant for nonspecific leukocytosis of 18.1.  Is overall unremarkable.  Pelvic exam without cervical motion tenderness, doubt PID.  Ultrasound is without any acute findings.  Patient initially refused STI treatment however upon further conversation she is agreeable.  Will treat prophylactically with Rocephin and azithromycin.  Patient tolerating p.o. intake while in the emergency department and on reassessment reports feeling much better.  Abdominal pain has improved, serial abdominal exams are benign.  Patient is aware she has gonorrhea and chlamydia test pending.  She will need to inform partner if positive.  She is also been having difficult time establishing care with gynecologist.  Will give information for [facility] women's walk-in clinic. Pt is comfortable with the plan and has no further questions at this time. Strict return precautions discussed. The patient appears reasonably screened and/or stabilized for discharge and I doubt any other medical condition or other [facility] requiring further screening, evaluation, or treatment in the ED at this time prior to discharge. The patient is safe for discharge with strict return precautions discussed. Recommend pcp and gyn follow up.  This note was  prepared using Dragon voice recognition software and may include unintentional dictation errors due to the inherent limitations of voice recognition software.        Cherre Robins, PA-C 09/29/18 1031    Elnora Morrison, MD 09/29/18 [phone]

## 2018-09-29 NOTE — ED Notes (Signed)
Pt verbalized understanding of d/c instructions. D/c to home with family

## 2018-09-29 NOTE — ED Provider Notes (Signed)
[facility] EMERGENCY DEPARTMENT Provider Note   CSN: [id number] Arrival date & time: 09/28/18  1954     History   Chief Complaint Chief Complaint  Patient presents with  . Abdominal Pain    HPI Jacquese Ayliana Casciano is a 43 y.o. female with history of morbid obesity, marijuana use, colitis, type 2 diabetes, hypertension, hyperlipidemia presents today for abdominal pain, nausea, vomiting and diarrhea.  Patient reports 3 weeks of abdominal pain, she reports a generalized abdominal pain primarily in the upper abdomen occasionally radiating down to the lower abdomen.  She describes a sharp severe and constant pain worsened with eating and without alleviating factors.  Patient reports that over the first 2 weeks she had nausea with multiple episodes of nonbloody/nonbilious emesis.  She reports that nausea and vomiting ceased last week but was replaced with intermittent diarrhea, she reports her last episode of diarrhea was dark red and she is concerned for blood in her stool.  She denies fever/chills, cough/shortness of breath, chest pain, vaginal bleeding/discharge, dysuria/hematuria or any additional concerns today.     HPI  Past Medical History:  Diagnosis Date  . Chest pain   . Colitis   . Hypertension   . Marijuana abuse   . Morbid obesity ([place])   . Panic attacks   . Type 2 diabetes mellitus with hyperlipidemia ([place]) 12/22/2016    Patient Active Problem List   Diagnosis Date Noted  . Abdominal pain 03/16/2018  . Hypomagnesemia 03/16/2018  . Hypertensive urgency 03/15/2018  . Acute cholecystitis due to biliary calculus 02/20/2018  . Generalized anxiety disorder with panic attacks 12/24/2016  . Type 2 diabetes mellitus with hyperlipidemia ([place]) 12/22/2016  . Hyperlipidemia 12/22/2016  . Chest pain 12/21/2016  . Accelerated hypertension 12/21/2016  . Hyperglycemia 12/21/2016  . Epigastric abdominal pain 12/21/2016  . Fatty liver disease, nonalcoholic  44/04/4740  . Thrombocytosis ([place]) 12/21/2016  . Panic disorder (episodic paroxysmal anxiety) 12/21/2016  . HTN (hypertension) 03/08/2013  . Hypokalemia 03/08/2013  . Headache(784.0) 03/08/2013  . Colitis 03/05/2013  . Leukocytosis 03/05/2013  . Sinusitis 03/05/2013    Past Surgical History:  Procedure Laterality Date  . CHOLECYSTECTOMY N/A 02/21/2018   Procedure: LAPAROSCOPIC CHOLECYSTECTOMY WITH INTRAOPERATIVE CHOLANGIOGRAM;  Surgeon: Donnie Mesa, MD;  Location: [facility];  Service: General;  Laterality: N/A;  . TUBAL LIGATION       OB History   No obstetric history on file.      Home Medications    Prior to Admission medications   Medication Sig Start Date End Date Taking? Authorizing Provider  amLODipine (NORVASC) 5 MG tablet Take 10 mg by mouth daily.    [provider]  hydrochlorothiazide (HYDRODIURIL) 25 MG tablet Take 0.5 tablets (12.5 mg total) by mouth daily. 03/17/18   Roxan Hockey, MD  hydrOXYzine (ATARAX/VISTARIL) 25 MG tablet Take 1 tablet (25 mg total) by mouth 3 (three) times daily as needed for anxiety. 03/17/18   Roxan Hockey, MD  lisinopril (PRINIVIL,ZESTRIL) 20 MG tablet Take 20 mg daily by mouth.    [provider]  ondansetron (ZOFRAN-ODT) 4 MG disintegrating tablet Take 1 tablet (4 mg total) by mouth every 6 (six) hours as needed for nausea or vomiting. 03/17/18   Roxan Hockey, MD  oxyCODONE (OXY IR/ROXICODONE) 5 MG immediate release tablet Take 1 tablet (5 mg total) by mouth every 6 (six) hours as needed. 03/17/18   Roxan Hockey, MD  polyethylene glycol (MIRALAX / GLYCOLAX) packet Take 17 g by mouth daily as  needed for mild constipation or moderate constipation. 03/17/18   Roxan Hockey, MD  QUEtiapine (SEROQUEL) 50 MG tablet Take 50 mg by mouth at bedtime.    [provider]    Family History Family History  Problem Relation Age of Onset  . Hypertension Other   . Diabetes Mellitus II Other   . Bipolar disorder  Mother   . Other Father        unaware of his PMH    Social History Social History   Tobacco Use  . Smoking status: Never Smoker  . Smokeless tobacco: Never Used  Substance Use Topics  . Alcohol use: No  . Drug use: Yes    Types: Marijuana    Comment: 2-3 x/month     Allergies   Patient has no known allergies.   Review of Systems Review of Systems Ten systems are reviewed and are negative for acute change except as noted in the HPI  Physical Exam Updated Vital Signs BP (!) 127/94 (BP Location: Right Arm)   Pulse 96   Temp 98.2 F (36.8 C) (Oral)   Resp 18   LMP 08/28/2018   SpO2 98%   Physical Exam Constitutional:      General: She is not in acute distress.    Appearance: Normal appearance. She is well-developed. She is obese. She is not ill-appearing or diaphoretic.  HENT:     Head: Normocephalic and atraumatic.     Right Ear: External ear normal.     Left Ear: External ear normal.     Nose: Nose normal.  Eyes:     General: Vision grossly intact. Gaze aligned appropriately.     Pupils: Pupils are equal, round, and reactive to light.  Neck:     Musculoskeletal: Normal range of motion.     Trachea: Trachea and phonation normal. No tracheal deviation.  Pulmonary:     Effort: Pulmonary effort is normal. No respiratory distress.  Abdominal:     General: There is no distension.     Palpations: Abdomen is soft.     Tenderness: There is generalized abdominal tenderness. There is no guarding or rebound.  Genitourinary:    Comments: Rectal examination chaperoned by Darl Householder RN.  Normal rectal tone, small external hemorrhoid present without bleeding.  No fissures or internal hemorrhoids palpated.  No gross blood on examination.   Pelvic initially examination deferred by patient.  Exam chaperoned by Leanord Hawking RN.  Pelvic exam: normal external genitalia without evidence of trauma. VULVA: normal appearing vulva with no masses, tenderness or lesion. VAGINA:  normal appearing vagina with normal color and discharge, no lesions. CERVIX: normal appearing cervix without lesions, cervical motion tenderness absent, cervical os closed vaginal discharge white/yellow Wet prep and DNA probe for chlamydia and GC obtained.   ADNEXA: normal adnexa in size, nontender and no masses UTERUS: uterus is normal size, shape, consistency and nontender.  Musculoskeletal: Normal range of motion.  Skin:    General: Skin is warm and dry.  Neurological:     Mental Status: She is alert.     GCS: GCS eye subscore is 4. GCS verbal subscore is 5. GCS motor subscore is 6.     Comments: Speech is clear and goal oriented, follows commands Major Cranial nerves without deficit, no facial droop Moves extremities without ataxia, coordination intact  Psychiatric:        Behavior: Behavior normal.    ED Treatments / Results  Labs (all labs ordered are listed, but only  abnormal results are displayed) Labs Reviewed  COMPREHENSIVE METABOLIC PANEL - Abnormal; Notable for the following components:      Result Value   Potassium 3.1 (*)    Glucose, Bld 179 (*)    Creatinine, Ser 1.03 (*)    All other components within normal limits  CBC - Abnormal; Notable for the following components:   WBC 18.1 (*)    Hemoglobin 11.8 (*)    Platelets 489 (*)    All other components within normal limits  URINALYSIS, ROUTINE W REFLEX MICROSCOPIC - Abnormal; Notable for the following components:   APPearance CLOUDY (*)    Hgb urine dipstick SMALL (*)    Bacteria, UA RARE (*)    All other components within normal limits  LIPASE, BLOOD  I-STAT BETA HCG BLOOD, ED ([facility], [facility], [facility] ONLY)    EKG None  Radiology No results found.  Procedures Procedures (including critical care time)  Medications Ordered in ED Medications  sodium chloride flush (NS) 0.9 % injection 3 mL (3 mLs Intravenous Given 09/29/18 0052)     Initial Impression / Assessment and Plan / ED Course  I have reviewed the  triage vital signs and the nursing notes.  Pertinent labs & imaging results that were available during my care of the patient were reviewed by me and considered in my medical decision making (see chart for details).  Clinical Course as of Sep 29 602  Thu Sep 29, 2018  7341 CT abdomen pelvis: Slight heterogenicity of the cervix with pericervical stranding at approximately 3:00-could reflect a cervicitis correlate with pelvic symptoms and exam findings, could consider pelvic ultrasound--few hypoattenuating foci within the cervix, likely nabothian cysts.  Normal uterus, normal ovaries.--No other acute abnormalities in the abdomen or pelvis.--Normal appendix.--Diverticulosis, no diverticulitis. -Dr. Marguerita Merles   [BM]    Clinical Course User Index [BM] Deliah Boston, PA-C   Patient refused morphine as she reports it causes pruritus, she reports she does not have adverse reaction to Dilaudid. - CBC with leukocytosis of 18.1 Lipase within normal limits Beta-hCG negative CMP nonacute Fecal occult negative Urinalysis nonacute - Upon discussion of CT scan with the patient she consented for pelvic examination with wet prep and GC chlamydia swab.  She reports that she is only sexually active with her husband of 37 years and is not concerned for sexually transmitted diseases. She wishes to wait for rocephin/azithro until tests result. Examination limited by body habitus today, cervix without lesion, mucoid cervical discharge present.  Some tenderness on examination and palpation of the abdomen, no cervical motion tenderness.  Discussed pelvic ultrasound with the patient and she wishes to proceed at this time.  Will give additional pain and nausea medication. - Pelvic ultrasound pending, care handoff given to oncoming team Kaitlyn Albrizze PA-C.  Plan of care at this time is follow-up ultrasound, reassessment, rediscuss STD treatment with patient, disposition per oncoming team, anticipate discharge.   Patient's case discussed with Dr. Christy Gentles during this visit.  Note: Portions of this report may have been transcribed using voice recognition software. Every effort was made to ensure accuracy; however, inadvertent computerized transcription errors may still be present. Final Clinical Impressions(s) / ED Diagnoses   Final diagnoses:  None    ED Discharge Orders    None       Gari Crown 09/29/18 [id number]    Ripley Fraise, MD 09/29/18 2308

## 2018-09-29 NOTE — ED Notes (Signed)
Pt reports that she had a dark red stool today as well.

## 2018-09-30 LAB — GC/CHLAMYDIA PROBE AMP (~~LOC~~) NOT AT ARMC
Chlamydia: NEGATIVE
Neisseria Gonorrhea: NEGATIVE

## 2018-10-02 ENCOUNTER — Emergency Department: Payer: Self-pay

## 2018-10-02 ENCOUNTER — Emergency Department
Admission: EM | Admit: 2018-10-02 | Discharge: 2018-10-02 | Disposition: A | Discharge status: Home / Self Care | Payer: Self-pay | Attending: Emergency Medicine | Admitting: Emergency Medicine

## 2018-10-02 ENCOUNTER — Other Ambulatory Visit: Payer: Self-pay

## 2018-10-02 DIAGNOSIS — R112 Nausea with vomiting, unspecified: Secondary | ICD-10-CM | POA: Insufficient documentation

## 2018-10-02 DIAGNOSIS — I10 Essential (primary) hypertension: Secondary | ICD-10-CM | POA: Insufficient documentation

## 2018-10-02 DIAGNOSIS — Z79899 Other long term (current) drug therapy: Secondary | ICD-10-CM | POA: Insufficient documentation

## 2018-10-02 DIAGNOSIS — R1010 Upper abdominal pain, unspecified: Secondary | ICD-10-CM | POA: Insufficient documentation

## 2018-10-02 DIAGNOSIS — E119 Type 2 diabetes mellitus without complications: Secondary | ICD-10-CM | POA: Insufficient documentation

## 2018-10-02 LAB — URINE DRUG SCREEN, QUALITATIVE (ARMC ONLY)
Amphetamines, Ur Screen: NOT DETECTED
Barbiturates, Ur Screen: NOT DETECTED
Benzodiazepine, Ur Scrn: POSITIVE — AB
Cannabinoid 50 Ng, Ur ~~LOC~~: POSITIVE — AB
Cocaine Metabolite,Ur ~~LOC~~: NOT DETECTED
MDMA (Ecstasy)Ur Screen: NOT DETECTED
Methadone Scn, Ur: NOT DETECTED
Opiate, Ur Screen: NOT DETECTED
Phencyclidine (PCP) Ur S: NOT DETECTED
Tricyclic, Ur Screen: NOT DETECTED

## 2018-10-02 LAB — URINALYSIS, COMPLETE (UACMP) WITH MICROSCOPIC
Bilirubin Urine: NEGATIVE
Glucose, UA: NEGATIVE mg/dL
Hgb urine dipstick: NEGATIVE
Ketones, ur: NEGATIVE mg/dL
Leukocytes,Ua: NEGATIVE
Nitrite: NEGATIVE
Protein, ur: NEGATIVE mg/dL
Specific Gravity, Urine: >1.046 — ABNORMAL HIGH (ref 1.005–1.030)
pH: 7 (ref 5.0–8.0)

## 2018-10-02 LAB — COMPREHENSIVE METABOLIC PANEL
ALT: 13 U/L (ref 0–44)
AST: 14 U/L — ABNORMAL LOW (ref 15–41)
Albumin: 3.4 g/dL — ABNORMAL LOW (ref 3.5–5.0)
Alkaline Phosphatase: 80 U/L (ref 38–126)
Anion gap: 11 (ref 5–15)
BUN: 13 mg/dL (ref 6–20)
CO2: 24 mmol/L (ref 22–32)
Calcium: 8.7 mg/dL — ABNORMAL LOW (ref 8.9–10.3)
Chloride: 103 mmol/L (ref 98–111)
Creatinine, Ser: 0.69 mg/dL (ref 0.44–1.00)
GFR calc Af Amer: >60 mL/min (ref >60)
GFR calc non Af Amer: >60 mL/min (ref >60)
Glucose, Bld: 172 mg/dL — ABNORMAL HIGH (ref 70–99)
Potassium: 3.5 mmol/L (ref 3.5–5.1)
Sodium: 138 mmol/L (ref 135–145)
Total Bilirubin: 0.4 mg/dL (ref 0.3–1.2)
Total Protein: 6.8 g/dL (ref 6.5–8.1)

## 2018-10-02 LAB — CBC
HCT: 36 % (ref 36.0–46.0)
Hemoglobin: 11.5 g/dL — ABNORMAL LOW (ref 12.0–15.0)
MCH: 26.8 pg (ref 26.0–34.0)
MCHC: 31.9 g/dL (ref 30.0–36.0)
MCV: 83.9 fL (ref 80.0–100.0)
Platelets: 422 10*3/uL — ABNORMAL HIGH (ref 150–400)
RBC: 4.29 MIL/uL (ref 3.87–5.11)
RDW: 14.8 % (ref 11.5–15.5)
WBC: 11.5 10*3/uL — ABNORMAL HIGH (ref 4.0–10.5)
nRBC: 0 % (ref 0.0–0.2)

## 2018-10-02 LAB — LIPASE, BLOOD: Lipase: 40 U/L (ref 11–51)

## 2018-10-02 MED ORDER — ONDANSETRON 4 MG PO TBDP
4.0000 mg | ORAL_TABLET | Freq: Once | ORAL | Status: AC
  Administered 2018-10-02: 4 mg via ORAL
  Filled 2018-10-02: qty 1

## 2018-10-02 MED ORDER — LIDOCAINE VISCOUS HCL 2 % MT SOLN
15.0000 mL | Freq: Once | OROMUCOSAL | Status: AC
  Administered 2018-10-02: 15 mL via ORAL
  Filled 2018-10-02: qty 15

## 2018-10-02 MED ORDER — PROMETHAZINE HCL 12.5 MG PO TABS: 12.5000 mg | ORAL_TABLET | Freq: Four times a day (QID) | ORAL | 0 refills | Status: DC | PRN

## 2018-10-02 MED ORDER — HYDROMORPHONE HCL 1 MG/ML IJ SOLN
0.5000 mg | Freq: Once | INTRAMUSCULAR | Status: AC
  Administered 2018-10-02: 06:00:00 0.5 mg via INTRAVENOUS
  Filled 2018-10-02: qty 1

## 2018-10-02 MED ORDER — ONDANSETRON HCL 4 MG/2ML IJ SOLN
4.0000 mg | Freq: Once | INTRAMUSCULAR | Status: AC
  Administered 2018-10-02: 4 mg via INTRAVENOUS
  Filled 2018-10-02: qty 2

## 2018-10-02 MED ORDER — PROMETHAZINE HCL 25 MG/ML IJ SOLN
12.5000 mg | Freq: Once | INTRAMUSCULAR | Status: AC
  Administered 2018-10-02: 06:00:00 12.5 mg via INTRAVENOUS

## 2018-10-02 MED ORDER — SODIUM CHLORIDE 0.9 % IV BOLUS
500.0000 mL | Freq: Once | INTRAVENOUS | Status: AC
  Administered 2018-10-02: 500 mL via INTRAVENOUS

## 2018-10-02 MED ORDER — FAMOTIDINE 20 MG PO TABS: 20.0000 mg | ORAL_TABLET | Freq: Two times a day (BID) | ORAL | 1 refills | Status: DC

## 2018-10-02 MED ORDER — SODIUM CHLORIDE 0.9 % IV SOLN
1000.0000 mL | Freq: Once | INTRAVENOUS | Status: AC
  Administered 2018-10-02: 1000 mL via INTRAVENOUS

## 2018-10-02 MED ORDER — IOHEXOL 300 MG/ML  SOLN
100.0000 mL | Freq: Once | INTRAMUSCULAR | Status: AC | PRN
  Administered 2018-10-02: 100 mL via INTRAVENOUS

## 2018-10-02 MED ORDER — HALOPERIDOL LACTATE 5 MG/ML IJ SOLN
5.0000 mg | Freq: Once | INTRAMUSCULAR | Status: AC
  Administered 2018-10-02: 5 mg via INTRAVENOUS
  Filled 2018-10-02: qty 1

## 2018-10-02 MED ORDER — ALUM & MAG HYDROXIDE-SIMETH 200-200-20 MG/5ML PO SUSP
30.0000 mL | Freq: Once | ORAL | Status: AC
  Administered 2018-10-02: 30 mL via ORAL
  Filled 2018-10-02: qty 30

## 2018-10-02 NOTE — ED Provider Notes (Addendum)
Patient received in signout from Dr. Corky Downs.  In short she is here for generalized abdominal pain and persistent nausea and vomiting.  Does have a history of marijuana abuse as well as anxiety.  CT imaging repeated here does not show any evidence of acute abnormality.  She does not have any significant leukocytosis.  No acidosis.  Urinalysis does show some mild dehydration which was repleted with IV fluids.  Her UDS did test positive for cannabinoid.  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  She was given Haldol with improvement in symptoms.  She is currently tolerating oral hydration at this point she is stable and appropriate for outpatient follow-up.   Merlyn Lot, MD 10/02/18 [place]    Merlyn Lot, MD 10/02/18 [phone]

## 2018-10-02 NOTE — ED Notes (Signed)
Patient transported to CT 

## 2018-10-02 NOTE — ED Provider Notes (Signed)
[facility] Emergency Department Provider Note   ____________________________________________    I have reviewed the triage vital signs and the nursing notes.   HISTORY  Chief Complaint Abdominal Pain     HPI Cassandra Greer is a 43 y.o. female history of hypertension, diabetes who presents with complaints of abdominal pain.  Patient reports she has had several weeks of abdominal pain, was seen at [facility] emergency department 2 days ago had normal CT scan, unremarkable pelvic ultrasound, has not been able to follow-up since then.  Reports she continues to have primarily upper abdominal pain with radiation into her lower abdomen bilaterally.  No fevers or chills.  Positive nausea vomiting.  No diarrhea.  Has been taking Phenergan for nausea with little improvement  Past Medical History:  Diagnosis Date  . Chest pain   . Colitis   . Hypertension   . Marijuana abuse   . Morbid obesity ([place])   . Panic attacks   . Type 2 diabetes mellitus with hyperlipidemia ([place]) 12/22/2016    Patient Active Problem List   Diagnosis Date Noted  . Abdominal pain 03/16/2018  . Hypomagnesemia 03/16/2018  . Hypertensive urgency 03/15/2018  . Acute cholecystitis due to biliary calculus 02/20/2018  . Generalized anxiety disorder with panic attacks 12/24/2016  . Type 2 diabetes mellitus with hyperlipidemia ([place]) 12/22/2016  . Hyperlipidemia 12/22/2016  . Chest pain 12/21/2016  . Accelerated hypertension 12/21/2016  . Hyperglycemia 12/21/2016  . Epigastric abdominal pain 12/21/2016  . Fatty liver disease, nonalcoholic [id number]  . Thrombocytosis ([place]) 12/21/2016  . Panic disorder (episodic paroxysmal anxiety) 12/21/2016  . HTN (hypertension) 03/08/2013  . Hypokalemia 03/08/2013  . Headache(784.0) 03/08/2013  . Colitis 03/05/2013  . Leukocytosis 03/05/2013  . Sinusitis 03/05/2013    Past Surgical History:  Procedure Laterality Date  . CHOLECYSTECTOMY  N/A 02/21/2018   Procedure: LAPAROSCOPIC CHOLECYSTECTOMY WITH INTRAOPERATIVE CHOLANGIOGRAM;  Surgeon: Donnie Mesa, MD;  Location: [place];  Service: General;  Laterality: N/A;  . TUBAL LIGATION      Prior to Admission medications   Medication Sig Start Date End Date Taking? Authorizing Provider  amLODipine (NORVASC) 5 MG tablet Take 10 mg by mouth daily.    [provider]  hydrochlorothiazide (HYDRODIURIL) 25 MG tablet Take 0.5 tablets (12.5 mg total) by mouth daily. 03/17/18   Roxan Hockey, MD  hydrOXYzine (ATARAX/VISTARIL) 25 MG tablet Take 1 tablet (25 mg total) by mouth 3 (three) times daily as needed for anxiety. Patient not taking: Reported on 09/29/2018 03/17/18   Roxan Hockey, MD  lisinopril (ZESTRIL) 40 MG tablet Take 40 mg by mouth daily.     [provider]  ondansetron (ZOFRAN-ODT) 4 MG disintegrating tablet Take 1 tablet (4 mg total) by mouth every 6 (six) hours as needed for nausea or vomiting. Patient not taking: Reported on 09/29/2018 03/17/18   Roxan Hockey, MD  oxyCODONE (OXY IR/ROXICODONE) 5 MG immediate release tablet Take 1 tablet (5 mg total) by mouth every 6 (six) hours as needed. Patient not taking: Reported on 09/29/2018 03/17/18   Roxan Hockey, MD  polyethylene glycol (MIRALAX / GLYCOLAX) packet Take 17 g by mouth daily as needed for mild constipation or moderate constipation. Patient not taking: Reported on 09/29/2018 03/17/18   Roxan Hockey, MD     Allergies Patient has no known allergies.  Family History  Problem Relation Age of Onset  . Hypertension Other   . Diabetes Mellitus II Other   . Bipolar disorder Mother   .  Other Father        unaware of his PMH    Social History Social History   Tobacco Use  . Smoking status: Never Smoker  . Smokeless tobacco: Never Used  Substance Use Topics  . Alcohol use: No  . Drug use: Yes    Types: Marijuana    Comment: 2-3 x/month    Review of Systems  Constitutional: No  fever/chills Eyes: No visual changes.  ENT: No sore throat. Cardiovascular: Denies chest pain. Respiratory: Denies shortness of breath. Gastrointestinal: As above Genitourinary: Negative for dysuria. Musculoskeletal: Negative for back pain. Skin: Negative for rash. Neurological: Negative for headaches    ____________________________________________   PHYSICAL EXAM:  VITAL SIGNS: ED Triage Vitals  Enc Vitals Group     BP 10/02/18 0516 (!) 164/109     Pulse Rate 10/02/18 0519 91     Resp 10/02/18 0516 20     Temp 10/02/18 0516 98.6 F (37 C)     Temp src --      SpO2 10/02/18 0519 98 %     Weight 10/02/18 0517 95.3 kg (210 lb)     Height 10/02/18 0517 1.676 m (5\' 6" )     Head Circumference --      Peak Flow --      Pain Score 10/02/18 0517 10     Pain Loc --      Pain Edu? --      Excl. in [place]? --     Constitutional: Alert and oriented.  Eyes: Conjunctivae are normal.   Nose: No congestion/rhinnorhea. Mouth/Throat: Mucous membranes are moist.    Cardiovascular: Normal rate, regular rhythm. Grossly normal heart sounds.  Good peripheral circulation. Respiratory: Normal respiratory effort.  No retractions. Lungs CTAB. Gastrointestinal: Mild epigastric tenderness palpation, no distention, no CVA tenderness, overall reassuring exam  Musculoskeletal: .  Warm and well perfused Neurologic:  Normal speech and language. No gross focal neurologic deficits are appreciated.  Skin:  Skin is warm, dry and intact. No rash noted.   ____________________________________________   LABS (all labs ordered are listed, but only abnormal results are displayed)  Labs Reviewed  CBC - Abnormal; Notable for the following components:      Result Value   WBC 11.5 (*)    Hemoglobin 11.5 (*)    Platelets 422 (*)    All other components within normal limits  COMPREHENSIVE METABOLIC PANEL - Abnormal; Notable for the following components:   Glucose, Bld 172 (*)    Calcium 8.7 (*)     Albumin 3.4 (*)    AST 14 (*)    All other components within normal limits  LIPASE, BLOOD   ____________________________________________  EKG  None ____________________________________________  RADIOLOGY  CT abdomen pelvis pending ____________________________________________   PROCEDURES  Procedure(s) performed: No  Procedures   Critical Care performed: No ____________________________________________   INITIAL IMPRESSION / ASSESSMENT AND PLAN / ED COURSE  Pertinent labs & imaging results that were available during my care of the patient were reviewed by me and considered in my medical decision making (see chart for details).  Patient presents with nausea vomiting, abdominal cramping, primarily upper.  Had significant work-up several days ago for similar symptoms.  Reassuring CT scan, ultrasound performed at that time.  We will check labs again today, give IV fluids, IV Zofran and consider whether patient may require additional imaging.  Suspicious for gastritis versus PUD given location of discomfort and nausea  ----------------------------------------- 6:37 AM on 10/02/2018 -----------------------------------------  Patient  continued having vomiting, given IV Phenergan as well as IV Dilaudid for pain.  Patient reports that her abdominal pain is significantly worse than 2 days ago when she last had scans, she feels that something is very wrong in her abdomen and would like to obtain imaging   We will obtain repeat CT scan, not tolerating p.o. contrast. Have asked my colleague to follow up on results.     ____________________________________________   FINAL CLINICAL IMPRESSION(S) / ED DIAGNOSES  Final diagnoses:  Pain of upper abdomen        Note:  This document was prepared using Dragon voice recognition software and may include unintentional dictation errors.   Lavonia Drafts, MD 10/02/18 [phone]

## 2018-10-02 NOTE — ED Notes (Signed)
Pt drank ginger ale without vomiting.

## 2018-10-02 NOTE — Discharge Instructions (Addendum)

## 2018-10-02 NOTE — ED Triage Notes (Signed)
Patient c/o generalized abdominal pain and nausea X 2 weeks. Patient seen at [facility] ED for same yesterday. Patient reports symptoms have not resolved.

## 2018-10-06 ENCOUNTER — Other Ambulatory Visit: Payer: Self-pay

## 2018-10-06 ENCOUNTER — Emergency Department (HOSPITAL_COMMUNITY)
Admission: EM | Admit: 2018-10-06 | Discharge: 2018-10-07 | Disposition: A | Discharge status: Home / Self Care | Payer: Medicaid Other | Attending: Emergency Medicine | Admitting: Emergency Medicine

## 2018-10-06 ENCOUNTER — Encounter (HOSPITAL_COMMUNITY): Payer: Self-pay

## 2018-10-06 DIAGNOSIS — F1310 Sedative, hypnotic or anxiolytic abuse, uncomplicated: Secondary | ICD-10-CM | POA: Insufficient documentation

## 2018-10-06 DIAGNOSIS — F319 Bipolar disorder, unspecified: Secondary | ICD-10-CM | POA: Insufficient documentation

## 2018-10-06 DIAGNOSIS — Z79899 Other long term (current) drug therapy: Secondary | ICD-10-CM | POA: Insufficient documentation

## 2018-10-06 DIAGNOSIS — I10 Essential (primary) hypertension: Secondary | ICD-10-CM | POA: Insufficient documentation

## 2018-10-06 DIAGNOSIS — R45851 Suicidal ideations: Secondary | ICD-10-CM | POA: Insufficient documentation

## 2018-10-06 DIAGNOSIS — F1210 Cannabis abuse, uncomplicated: Secondary | ICD-10-CM | POA: Insufficient documentation

## 2018-10-06 DIAGNOSIS — Z20828 Contact with and (suspected) exposure to other viral communicable diseases: Secondary | ICD-10-CM | POA: Insufficient documentation

## 2018-10-06 DIAGNOSIS — F3010 Manic episode without psychotic symptoms, unspecified: Secondary | ICD-10-CM

## 2018-10-06 DIAGNOSIS — E119 Type 2 diabetes mellitus without complications: Secondary | ICD-10-CM | POA: Insufficient documentation

## 2018-10-06 LAB — COMPREHENSIVE METABOLIC PANEL
ALT: 13 U/L (ref 0–44)
AST: 14 U/L — ABNORMAL LOW (ref 15–41)
Albumin: 3.4 g/dL — ABNORMAL LOW (ref 3.5–5.0)
Alkaline Phosphatase: 87 U/L (ref 38–126)
Anion gap: 11 (ref 5–15)
BUN: 9 mg/dL (ref 6–20)
CO2: 22 mmol/L (ref 22–32)
Calcium: 8.4 mg/dL — ABNORMAL LOW (ref 8.9–10.3)
Chloride: 100 mmol/L (ref 98–111)
Creatinine, Ser: 0.84 mg/dL (ref 0.44–1.00)
GFR calc Af Amer: >60 mL/min (ref >60)
GFR calc non Af Amer: >60 mL/min (ref >60)
Glucose, Bld: 179 mg/dL — ABNORMAL HIGH (ref 70–99)
Potassium: 3.5 mmol/L (ref 3.5–5.1)
Sodium: 133 mmol/L — ABNORMAL LOW (ref 135–145)
Total Bilirubin: 0.3 mg/dL (ref 0.3–1.2)
Total Protein: 6.5 g/dL (ref 6.5–8.1)

## 2018-10-06 LAB — CBC
HCT: 34.7 % — ABNORMAL LOW (ref 36.0–46.0)
Hemoglobin: 11.2 g/dL — ABNORMAL LOW (ref 12.0–15.0)
MCH: 27.5 pg (ref 26.0–34.0)
MCHC: 32.3 g/dL (ref 30.0–36.0)
MCV: 85.3 fL (ref 80.0–100.0)
Platelets: 424 10*3/uL — ABNORMAL HIGH (ref 150–400)
RBC: 4.07 MIL/uL (ref 3.87–5.11)
RDW: 15.3 % (ref 11.5–15.5)
WBC: 14.1 10*3/uL — ABNORMAL HIGH (ref 4.0–10.5)
nRBC: 0 % (ref 0.0–0.2)

## 2018-10-06 LAB — RAPID URINE DRUG SCREEN, HOSP PERFORMED
Amphetamines: NOT DETECTED
Barbiturates: NOT DETECTED
Benzodiazepines: POSITIVE — AB
Cocaine: NOT DETECTED
Opiates: NOT DETECTED
Tetrahydrocannabinol: POSITIVE — AB

## 2018-10-06 LAB — ACETAMINOPHEN LEVEL: Acetaminophen (Tylenol), Serum: <10 ug/mL — ABNORMAL LOW (ref 10–30)

## 2018-10-06 LAB — I-STAT BETA HCG BLOOD, ED (MC, WL, AP ONLY): I-stat hCG, quantitative: <5 m[IU]/mL (ref <5)

## 2018-10-06 LAB — SALICYLATE LEVEL: Salicylate Lvl: <7 mg/dL (ref 2.8–30.0)

## 2018-10-06 LAB — ETHANOL: Alcohol, Ethyl (B): <10 mg/dL (ref <10)

## 2018-10-06 NOTE — ED Triage Notes (Signed)
Pt arrives POV for eval of SI and manic behavior. Pt w/ pressured rapid speech in triage, but please and cooperative. Pt endorses hx of same, pt is fidgety in triage and cant sit still.

## 2018-10-07 LAB — SARS CORONAVIRUS 2 (TAT 6-24 HRS): SARS Coronavirus 2: NEGATIVE

## 2018-10-07 MED ORDER — ALUM & MAG HYDROXIDE-SIMETH 200-200-20 MG/5ML PO SUSP: 30.0000 mL | Freq: Four times a day (QID) | ORAL | Status: DC | PRN

## 2018-10-07 MED ORDER — ZOLPIDEM TARTRATE 5 MG PO TABS: 5.0000 mg | ORAL_TABLET | Freq: Every evening | ORAL | Status: DC | PRN

## 2018-10-07 MED ORDER — ONDANSETRON 4 MG PO TBDP
8.0000 mg | ORAL_TABLET | Freq: Once | ORAL | Status: AC
  Administered 2018-10-07: 8 mg via ORAL
  Filled 2018-10-07: qty 2

## 2018-10-07 MED ORDER — ONDANSETRON HCL 4 MG PO TABS: 4.0000 mg | ORAL_TABLET | Freq: Three times a day (TID) | ORAL | Status: DC | PRN

## 2018-10-07 MED ORDER — FAMOTIDINE 20 MG PO TABS
20.0000 mg | ORAL_TABLET | Freq: Two times a day (BID) | ORAL | Status: DC
  Administered 2018-10-07 (×2): 20 mg via ORAL
  Filled 2018-10-07 (×2): qty 1

## 2018-10-07 MED ORDER — HALOPERIDOL LACTATE 5 MG/ML IJ SOLN
5.0000 mg | Freq: Once | INTRAMUSCULAR | Status: AC
  Administered 2018-10-07: 5 mg via INTRAMUSCULAR
  Filled 2018-10-07: qty 1

## 2018-10-07 MED ORDER — LORAZEPAM 1 MG PO TABS
1.0000 mg | ORAL_TABLET | Freq: Once | ORAL | Status: AC
  Administered 2018-10-07: 1 mg via ORAL
  Filled 2018-10-07: qty 1

## 2018-10-07 MED ORDER — IBUPROFEN 400 MG PO TABS: 600.0000 mg | ORAL_TABLET | Freq: Three times a day (TID) | ORAL | Status: DC | PRN

## 2018-10-07 MED ORDER — HYDROCHLOROTHIAZIDE 25 MG PO TABS
12.5000 mg | ORAL_TABLET | Freq: Every day | ORAL | Status: DC
  Administered 2018-10-07: 12.5 mg via ORAL
  Filled 2018-10-07: qty 1

## 2018-10-07 MED ORDER — AMLODIPINE BESYLATE 5 MG PO TABS
10.0000 mg | ORAL_TABLET | Freq: Every day | ORAL | Status: DC
  Administered 2018-10-07: 10 mg via ORAL
  Filled 2018-10-07: qty 2

## 2018-10-07 NOTE — ED Notes (Signed)
RN contacted from [facility] stating they recommend inpatient treatment. RN spoke with [place], MD regarding patient wanting to leave. Patient contracts for safety and feels safe to leave.
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
## 2018-10-07 NOTE — Progress Notes (Signed)
CSW faxed referrals to the following for placement consideration:  [place] [facility]   CSW will continue to follow for possible placement.    Netta Neat, MSW, LCSW Clinical Social Work

## 2018-10-07 NOTE — ED Provider Notes (Signed)
.  11:18 AM Nursing asked that I come reassess patient and she wants to go home now and is not under IVC.  Chart review shows that psychiatry talked the patient early this morning and felt that she could not contract for safety at that time.  They recommended inpatient management at that time.  Nursing called psychiatry to ask with their recommendations be now that she is feeling better, denying SI or HI, deny hallucinations, and feels she can contract for safety.  They felt that if we assessed her and thought that she was safe for discharge, we could do so versus IVC her for infection management.  I just went assessed the patient and she is denying any SI or HI.  She reports that she had not slept in several days and all the stresses were getting to her.  She reports that she has no intention of hurting herself as she lost her family and children.  She reports that she is going to contact [facility] and get reestablished with her psychiatry team and understands extremely strict return precautions for any new or worsening thoughts.  She says that she is not going to hurt herself and is able to contract for safety at this time.  Her speech is clear and she is making sense.  Her exam is unremarkable otherwise and she denies physical complaints.  Given her reassuring reevaluation and her ability to contract for safety, I believe the patient and think she has a for discharge home.  Psychiatry felt we could reassess and determine disposition, feel she safe for discharge home at this time.  Patient will follow-up with psychiatry and understands return precautions.  Patient discharged in good condition.   Clinical Impression: 1. Manic behavior ([place])     Disposition: Discharge  Condition: Good  I have discussed the results, Dx and Tx plan with the pt(& family if present). He/she/they expressed understanding and agree(s) with the plan. Discharge instructions discussed at great length. Strict return precautions  discussed and pt &/or family have verbalized understanding of the instructions. No further questions at time of discharge.    New Prescriptions   No medications on file    Follow Up: Your psychiatry team     [place] [id number] [phone]     [place] [address] [id number] mc [place] [phone]         [place]ton Kienle, Gwenyth Allegra, MD 10/07/18 [phone]

## 2018-10-07 NOTE — ED Notes (Signed)
Pt belongings (2 bags) inventoried and placed in locker 8. Valuables sent to security # [phone] Pt signed Medical Clearance Pt. Policy.

## 2018-10-07 NOTE — ED Notes (Signed)
Pt wanded by security at this time  ?

## 2018-10-07 NOTE — ED Notes (Signed)
Patient requesting to leave. States she is feeling much better after getting some rest. RN called patient assessment and informed. States will reassess patient soon and come up with plan of care. Patient updated and placed in recliner for comfort.

## 2018-10-07 NOTE — Discharge Instructions (Signed)
Please get some rest and follow-up with your psychiatry team in the next 24 to 48 hours.  If you develop any new or worsening thoughts including thoughts of harming herself or others, please return to the nearest emergency department immediately.

## 2018-10-07 NOTE — ED Notes (Signed)
Patient very agitated upon my arrival to room.  She states that she is feeling very panicky and needs something, she stated she was feeling nauseated.  MD notified, new orders per Dr Roxanne Mins.

## 2018-10-07 NOTE — ED Notes (Signed)
Pt in hospital attier, with sitter at bedside, pacing room. Pt states being unable to stay still.Pt. requested ativan. Pt cooperative.   Pt verbalized to nurse sometimes she hears and sees a person who talks to her. She thought it was her daughter. She answered the person but no one was their. Pt denies command hallucinations.   Pt confirms thoughts of SI without intent. States she would not want to suffer. Pt. Denies plan.

## 2018-10-07 NOTE — ED Provider Notes (Signed)
[facility]ERGENCY DEPARTMENT Provider Note   CSN: [id number] Arrival date & time: 10/06/18  2101     History   Chief Complaint Chief Complaint  Patient presents with  . Suicidal  . Manic Behavior    HPI Cassandra Greer is a 43 y.o. female.     The history is provided by the patient and medical records.     43 year old female with history of hypertension, marijuana abuse, anxiety, diabetes, presenting to the ED with manic behavior.  Patient states last week she was seen twice for abdominal pain with negative work-up both times.  States since then she felt like "she is going crazy".  States she does not feel like she is in a good place mentally at this time.  States she has been awake, pacing around her house, unable to sleep or eat for the past 3 days.  States now her hips are starting to hurt just from pacing.  States she is tried taking her anxiety medication as well as sleep aids and is still unable to rest or to calm her mind.  States she started to have suicidal thoughts because she is unable to relax and "cannot take it anymore".  She denies any specific plan.  No homicidal ideation.  No hallucination.  No recent alcohol abuse.  She does use occasional THC.  Past Medical History:  Diagnosis Date  . Chest pain   . Colitis   . Hypertension   . Marijuana abuse   . Morbid obesity ([place])   . Panic attacks   . Type 2 diabetes mellitus with hyperlipidemia ([place]) 12/22/2016    Patient Active Problem List   Diagnosis Date Noted  . Abdominal pain 03/16/2018  . Hypomagnesemia 03/16/2018  . Hypertensive urgency 03/15/2018  . Acute cholecystitis due to biliary calculus 02/20/2018  . Generalized anxiety disorder with panic attacks 12/24/2016  . Type 2 diabetes mellitus with hyperlipidemia ([place]) 12/22/2016  . Hyperlipidemia 12/22/2016  . Chest pain 12/21/2016  . Accelerated hypertension 12/21/2016  . Hyperglycemia 12/21/2016  . Epigastric abdominal pain  12/21/2016  . Fatty liver disease, nonalcoholic [id number]  . Thrombocytosis ([place]) 12/21/2016  . Panic disorder (episodic paroxysmal anxiety) 12/21/2016  . HTN (hypertension) 03/08/2013  . Hypokalemia 03/08/2013  . Headache(784.0) 03/08/2013  . Colitis 03/05/2013  . Leukocytosis 03/05/2013  . Sinusitis 03/05/2013    Past Surgical History:  Procedure Laterality Date  . CHOLECYSTECTOMY N/A 02/21/2018   Procedure: LAPAROSCOPIC CHOLECYSTECTOMY WITH INTRAOPERATIVE CHOLANGIOGRAM;  Surgeon: Donnie Mesa, MD;  Location: [facility];  Service: General;  Laterality: N/A;  . TUBAL LIGATION       OB History   No obstetric history on file.      Home Medications    Prior to Admission medications   Medication Sig Start Date End Date Taking? Authorizing Provider  amLODipine (NORVASC) 5 MG tablet Take 10 mg by mouth daily.    [provider]  famotidine (PEPCID) 20 MG tablet Take 1 tablet (20 mg total) by mouth 2 (two) times daily. 10/02/18 10/02/19  Merlyn Lot, MD  hydrochlorothiazide (HYDRODIURIL) 25 MG tablet Take 0.5 tablets (12.5 mg total) by mouth daily. 03/17/18   Roxan Hockey, MD  hydrOXYzine (ATARAX/VISTARIL) 25 MG tablet Take 1 tablet (25 mg total) by mouth 3 (three) times daily as needed for anxiety. Patient not taking: Reported on 09/29/2018 03/17/18   Roxan Hockey, MD  lisinopril (ZESTRIL) 40 MG tablet Take 40 mg by mouth daily.     [provider]  ondansetron (ZOFRAN-ODT) 4 MG disintegrating tablet Take 1 tablet (4 mg total) by mouth every 6 (six) hours as needed for nausea or vomiting. Patient not taking: Reported on 09/29/2018 03/17/18   Roxan Hockey, MD  oxyCODONE (OXY IR/ROXICODONE) 5 MG immediate release tablet Take 1 tablet (5 mg total) by mouth every 6 (six) hours as needed. Patient not taking: Reported on 09/29/2018 03/17/18   Roxan Hockey, MD  polyethylene glycol (MIRALAX / GLYCOLAX) packet Take 17 g by mouth daily as needed for mild constipation  or moderate constipation. Patient not taking: Reported on 09/29/2018 03/17/18   Roxan Hockey, MD  promethazine (PHENERGAN) 12.5 MG tablet Take 1 tablet (12.5 mg total) by mouth every 6 (six) hours as needed for nausea or vomiting. 10/02/18   Merlyn Lot, MD    Family History Family History  Problem Relation Age of Onset  . Hypertension Other   . Diabetes Mellitus II Other   . Bipolar disorder Mother   . Other Father        unaware of his PMH    Social History Social History   Tobacco Use  . Smoking status: Never Smoker  . Smokeless tobacco: Never Used  Substance Use Topics  . Alcohol use: No  . Drug use: Yes    Types: Marijuana    Comment: 2-3 x/month     Allergies   Patient has no known allergies.   Review of Systems Review of Systems  Psychiatric/Behavioral: Positive for suicidal ideas. The patient is hyperactive.   All other systems reviewed and are negative.    Physical Exam Updated Vital Signs BP (!) 153/118 (BP Location: Right Arm)   Pulse 98   Temp 98.2 F (36.8 C) (Oral)   Resp 18   Ht 5\' 6"  (1.676 m)   Wt 104.3 kg   SpO2 97%   BMI 37.12 kg/m   Physical Exam Vitals signs and nursing note reviewed.  Constitutional:      Appearance: She is well-developed.  HENT:     Head: Normocephalic and atraumatic.  Eyes:     Conjunctiva/sclera: Conjunctivae normal.     Pupils: Pupils are equal, round, and reactive to light.  Neck:     Musculoskeletal: Normal range of motion.  Cardiovascular:     Rate and Rhythm: Normal rate and regular rhythm.     Heart sounds: Normal heart sounds.  Pulmonary:     Effort: Pulmonary effort is normal.     Breath sounds: Normal breath sounds.  Abdominal:     General: Bowel sounds are normal.     Palpations: Abdomen is soft.  Musculoskeletal: Normal range of motion.  Skin:    General: Skin is warm and dry.  Neurological:     Mental Status: She is alert and oriented to person, place, and time.  Psychiatric:         Mood and Affect: Mood is anxious.     Comments: Some manic behavior observed, rapid speech, continuously pacing around the room, appears anxious      ED Treatments / Results  Labs (all labs ordered are listed, but only abnormal results are displayed) Labs Reviewed  COMPREHENSIVE METABOLIC PANEL - Abnormal; Notable for the following components:      Result Value   Sodium 133 (*)    Glucose, Bld 179 (*)    Calcium 8.4 (*)    Albumin 3.4 (*)    AST 14 (*)    All other components within normal limits  ACETAMINOPHEN  LEVEL - Abnormal; Notable for the following components:   Acetaminophen (Tylenol), Serum <10 (*)    All other components within normal limits  CBC - Abnormal; Notable for the following components:   WBC 14.1 (*)    Hemoglobin 11.2 (*)    HCT 34.7 (*)    Platelets 424 (*)    All other components within normal limits  RAPID URINE DRUG SCREEN, HOSP PERFORMED - Abnormal; Notable for the following components:   Benzodiazepines POSITIVE (*)    Tetrahydrocannabinol POSITIVE (*)    All other components within normal limits  SARS CORONAVIRUS 2 (HOSPITAL ORDER, [place] [place] [facility])  ETHANOL  SALICYLATE LEVEL  I-STAT BETA HCG BLOOD, ED ([facility], [facility], [facility] ONLY)    EKG None  Radiology No results found.  Procedures Procedures (including critical care time)  Medications Ordered in ED Medications  ibuprofen (ADVIL) tablet 600 mg (has no administration in time range)  zolpidem (AMBIEN) tablet 5 mg (has no administration in time range)  alum & mag hydroxide-simeth (MAALOX/MYLANTA) 200-200-20 MG/5ML suspension 30 mL (has no administration in time range)  ondansetron (ZOFRAN) tablet 4 mg (has no administration in time range)  amLODipine (NORVASC) tablet 10 mg (has no administration in time range)  famotidine (PEPCID) tablet 20 mg (20 mg Oral Given 10/07/18 0029)  hydrochlorothiazide (HYDRODIURIL) tablet 12.5 mg (has no administration in time range)  LORazepam  (ATIVAN) tablet 1 mg (1 mg Oral Given 10/07/18 0029)  haloperidol lactate (HALDOL) injection 5 mg (5 mg Intramuscular Given 10/07/18 0339)  ondansetron (ZOFRAN-ODT) disintegrating tablet 8 mg (8 mg Oral Given 10/07/18 0339)     Initial Impression / Assessment and Plan / ED Course  I have reviewed the triage vital signs and the nursing notes.  Pertinent labs & imaging results that were available during my care of the patient were reviewed by me and considered in my medical decision making (see chart for details).  43 year old female presenting to the ED with manic behavior and suicidal ideation.  States she has felt extremely anxious, jittery, and "out of her mind" for the past 3 days.  States she is not been to sleep, eating, or been able to stop pacing around for about 3 days.  States she is not sure why she feels this way.  She was having some abdominal pain previously and was evaluated twice with negative work-ups including labs and CT scan.  States she was told nothing was wrong and it makes her feel like "I am going crazy".  On exam she is pacing around the room, she does appear anxious, speech is very rapid and pressured.  She reports taking her own anxiety medication and sleep aid without any improvement.  In regards to suicidal ideation she does not give any specific plan but states "I cannot take it anymore".  Her screening labs are overall reassuring, UDS is positive for benzodiazepines and THC.  Mild hyperglycemia but no evidence of DKA.  Medically cleared.  TTS will be consulted, home meds have been ordered.  She was given dose of ativan to help with anxiety.  TTS has evaluated, recommends inpatient placement and will seek placement.  COVID screen has been ordered.  Of note, patient's husband and family does not know that she is here in the ER and she does not want her medical care disclosed to them.  Final Clinical Impressions(s) / ED Diagnoses   Final diagnoses:  Manic behavior [facility])     ED Discharge Orders  None       Larene Pickett, PA-C 10/07/18 0600    Cardama, Grayce Sessions, MD 10/08/18 [phone]

## 2018-10-07 NOTE — ED Notes (Signed)
ordered bfast

## 2019-01-31 ENCOUNTER — Telehealth (HOSPITAL_COMMUNITY): Payer: Self-pay

## 2019-01-31 NOTE — Telephone Encounter (Signed)
Telephoned patient at home number. Left a message to call and schedule with [facility]. 

## 2019-03-16 IMAGING — US US ABDOMEN LIMITED
1 series · 14 of 25 positions shown · non-contrast
Comparison: 04/24/2016

CLINICAL DATA: Right upper quadrant pain for 2 days

EXAM:
ULTRASOUND ABDOMEN LIMITED RIGHT UPPER QUADRANT

[Series 1: us abdomen limited · 0.23mm/px · 14 of 42 slices shown]
[im 1/42]
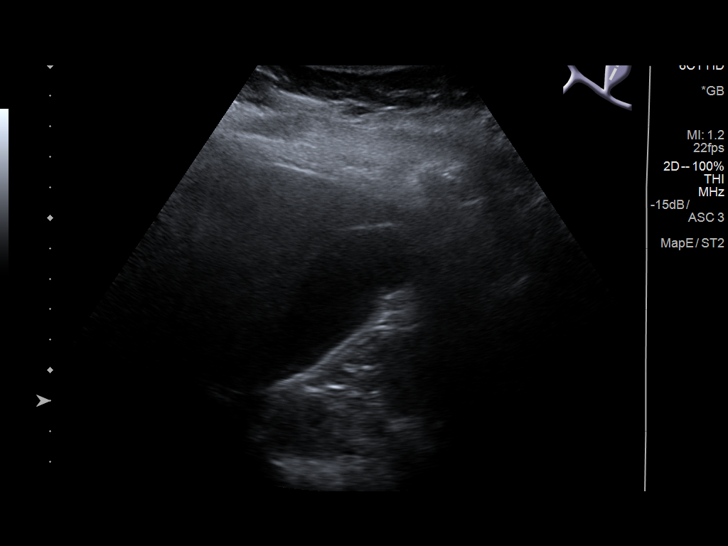
[im 4/42]
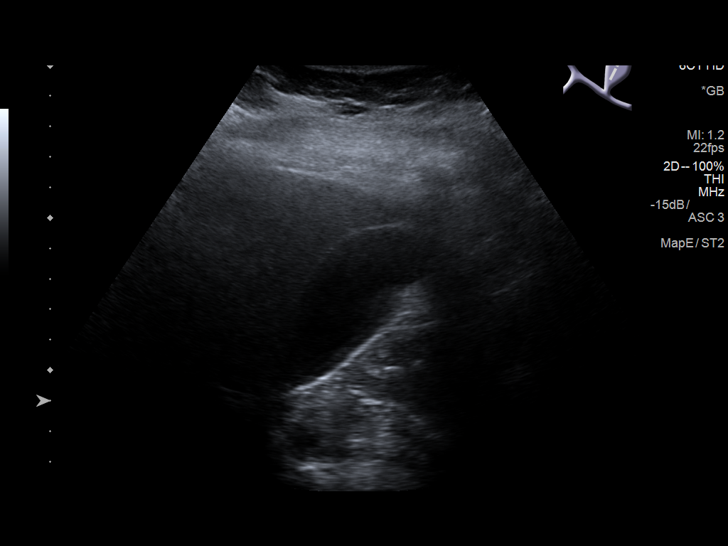
[im 7/42]
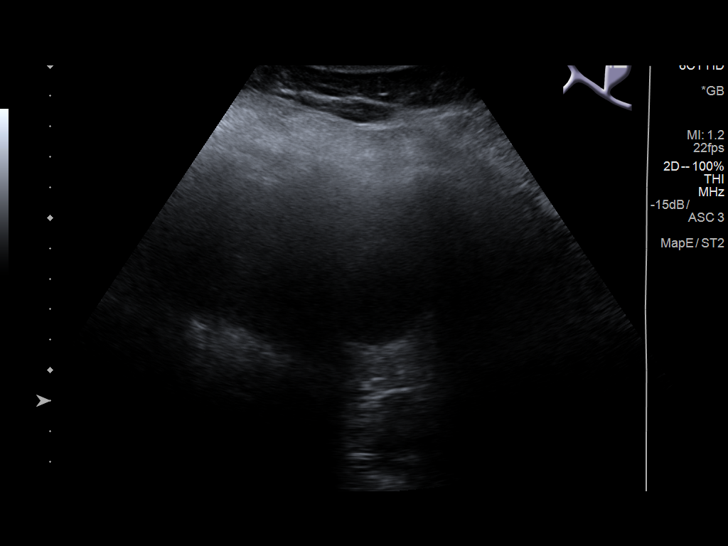
[im 11/42]
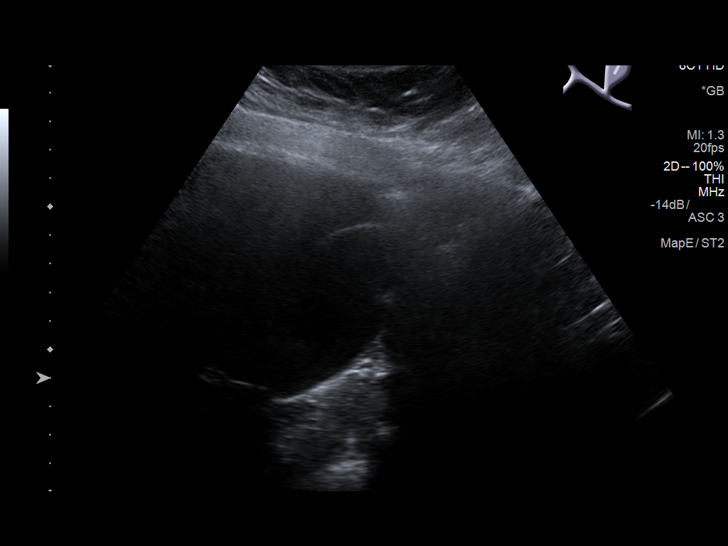
[im 14/42]
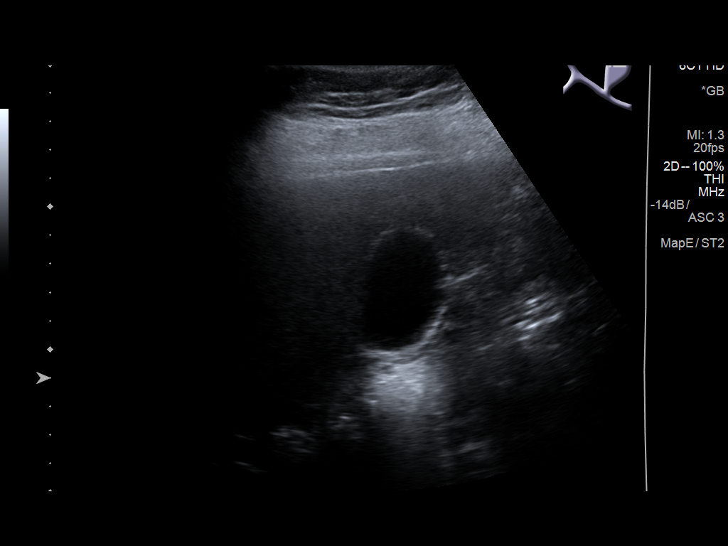
[im 16/42]
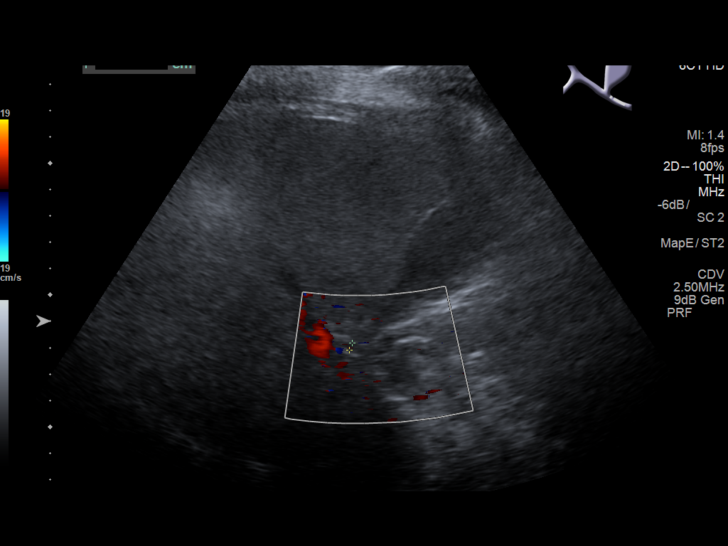
[im 19/42]
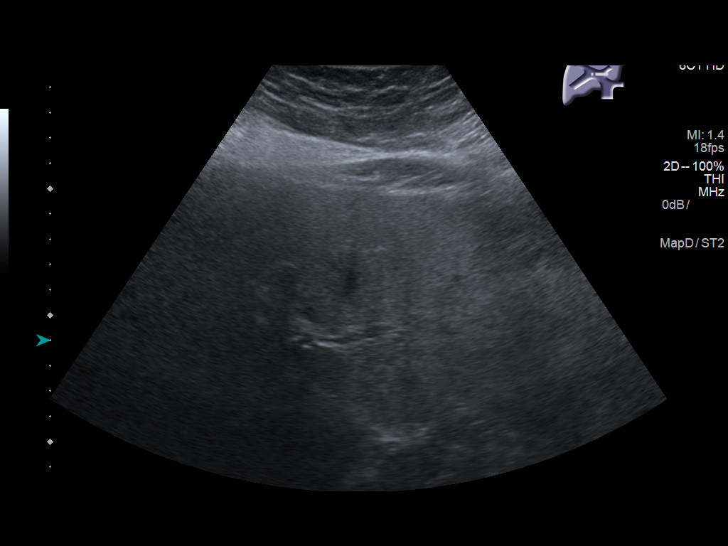
[im 23/42]
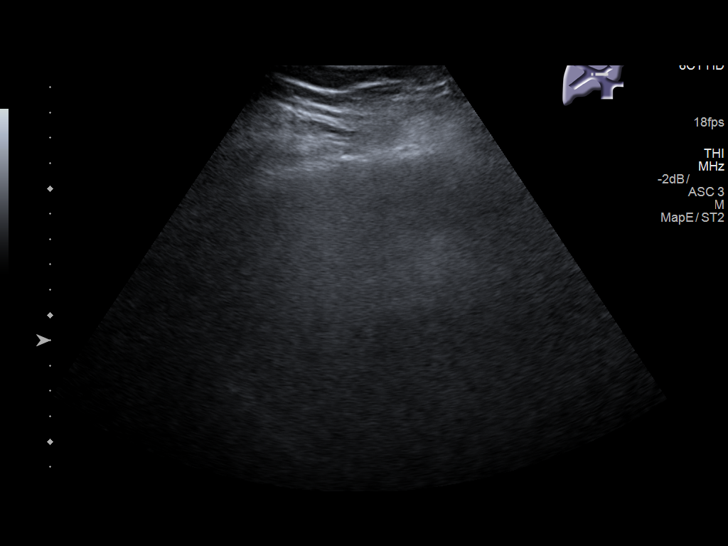
[im 26/42]
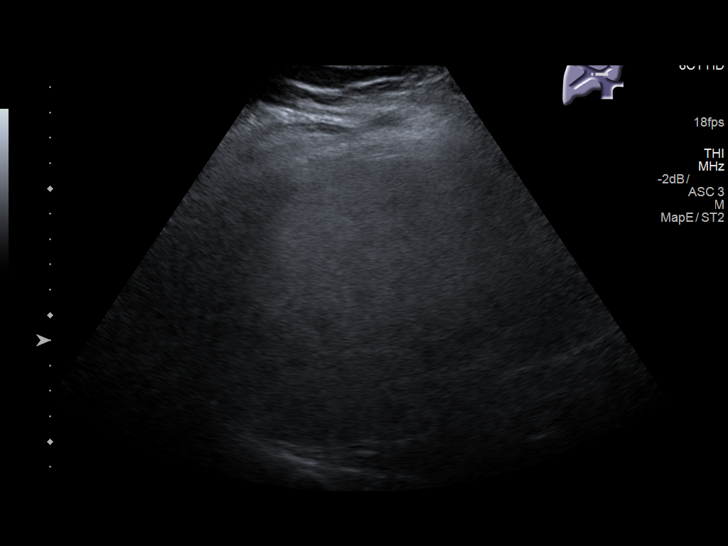
[im 28/42]
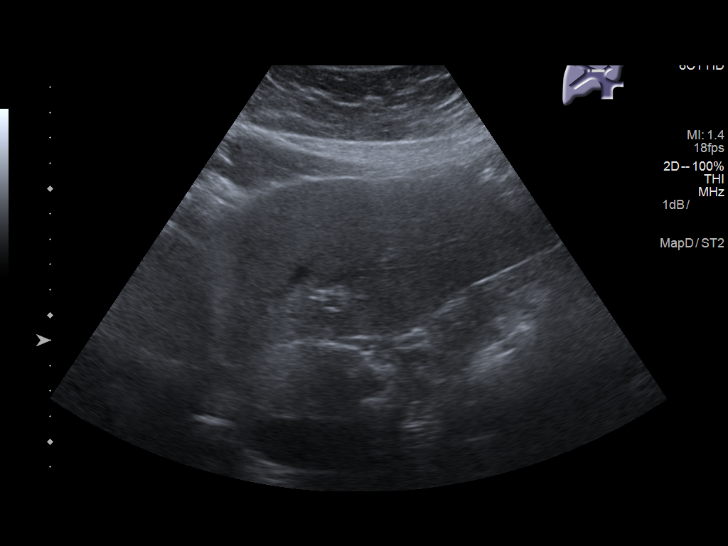
[im 31/42]
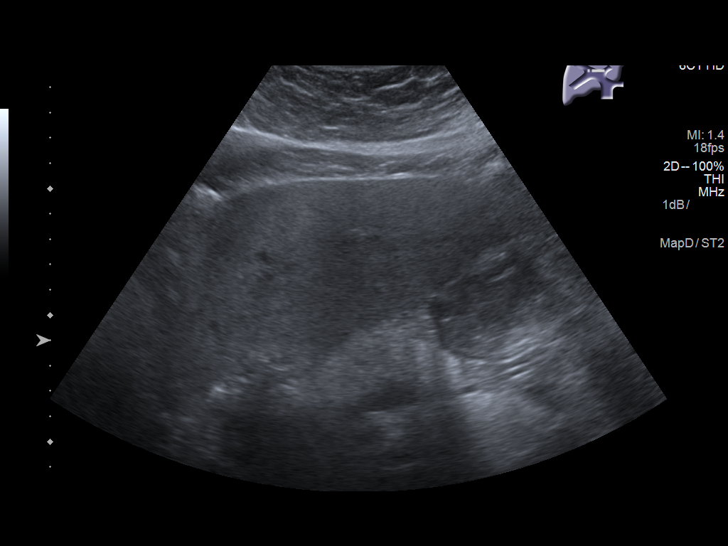
[im 35/42]
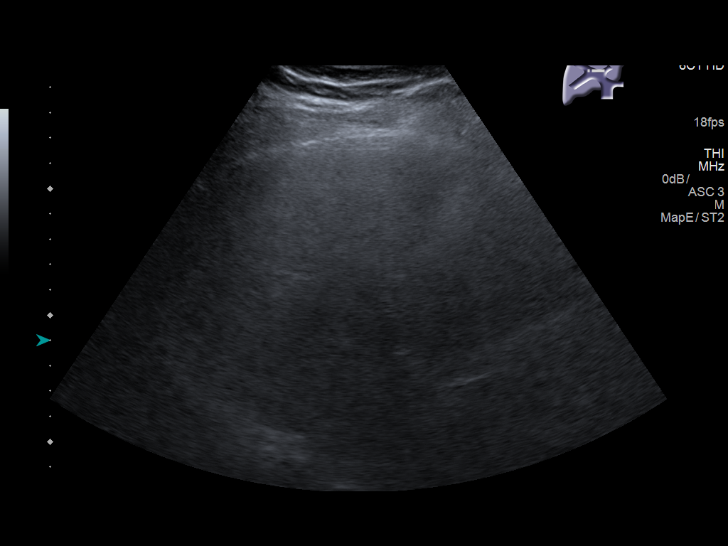
[im 38/42]
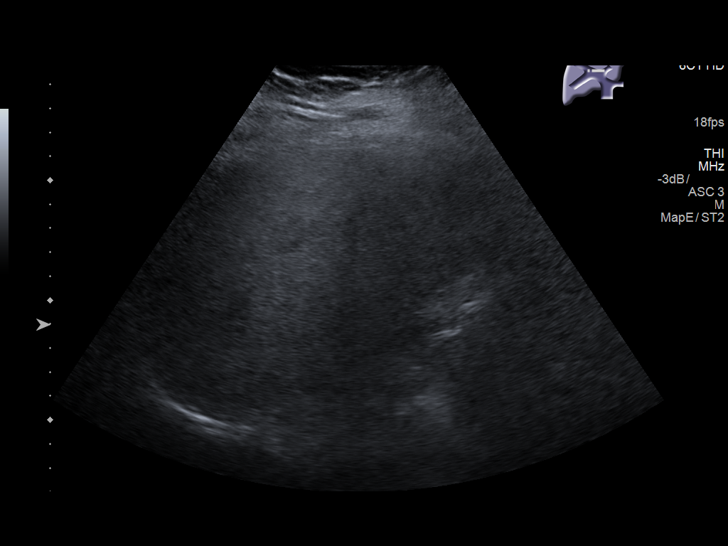
[im 42/42]
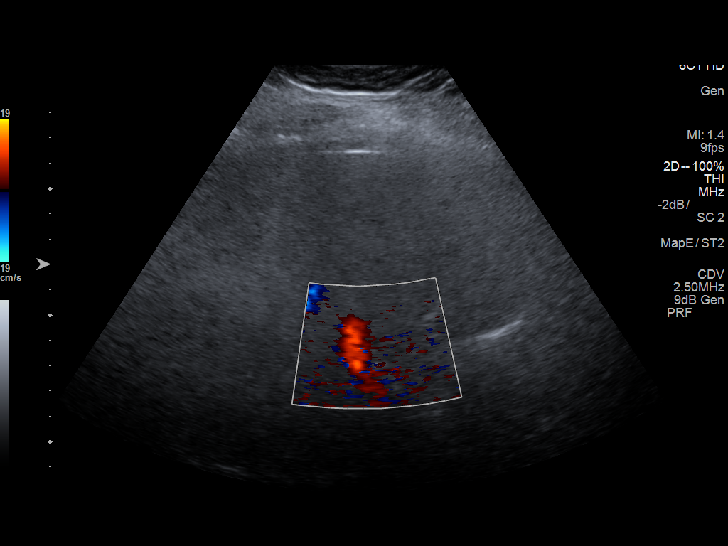

[14 of 25 positions shown; findings below may reference images not displayed]

FINDINGS: Gallbladder:

No gallstones or wall thickening visualized. No sonographic Murphy
sign noted by sonographer.

Common bile duct:

Diameter: 2.9 mm

Liver:

Diffuse increased hepatic echogenicity. Portal vein is patent on
color Doppler imaging with normal direction of blood flow towards
the liver.
IMPRESSION: 1. Negative for gallstones or biliary dilatation
2. Diffuse increased hepatic echogenicity consistent with fatty
infiltration.

## 2019-08-04 ENCOUNTER — Emergency Department (HOSPITAL_COMMUNITY)
Admission: EM | Admit: 2019-08-04 | Discharge: 2019-08-04 | Disposition: A | Discharge status: Home / Self Care | Payer: Self-pay | Attending: Emergency Medicine | Admitting: Emergency Medicine

## 2019-08-04 ENCOUNTER — Encounter (HOSPITAL_COMMUNITY): Payer: Self-pay

## 2019-08-04 ENCOUNTER — Other Ambulatory Visit: Payer: Self-pay

## 2019-08-04 ENCOUNTER — Emergency Department (HOSPITAL_COMMUNITY): Payer: Self-pay

## 2019-08-04 DIAGNOSIS — K439 Ventral hernia without obstruction or gangrene: Secondary | ICD-10-CM | POA: Insufficient documentation

## 2019-08-04 DIAGNOSIS — I10 Essential (primary) hypertension: Secondary | ICD-10-CM | POA: Insufficient documentation

## 2019-08-04 DIAGNOSIS — Z79899 Other long term (current) drug therapy: Secondary | ICD-10-CM | POA: Insufficient documentation

## 2019-08-04 DIAGNOSIS — E119 Type 2 diabetes mellitus without complications: Secondary | ICD-10-CM | POA: Insufficient documentation

## 2019-08-04 LAB — URINALYSIS, ROUTINE W REFLEX MICROSCOPIC
Bilirubin Urine: NEGATIVE
Glucose, UA: NEGATIVE mg/dL
Ketones, ur: NEGATIVE mg/dL
Leukocytes,Ua: NEGATIVE
Nitrite: NEGATIVE
Protein, ur: NEGATIVE mg/dL
Specific Gravity, Urine: 1.012 (ref 1.005–1.030)
pH: 7 (ref 5.0–8.0)

## 2019-08-04 LAB — CBC
HCT: 38.4 % (ref 36.0–46.0)
Hemoglobin: 11.7 g/dL — ABNORMAL LOW (ref 12.0–15.0)
MCH: 25.9 pg — ABNORMAL LOW (ref 26.0–34.0)
MCHC: 30.5 g/dL (ref 30.0–36.0)
MCV: 85.1 fL (ref 80.0–100.0)
Platelets: 533 10*3/uL — ABNORMAL HIGH (ref 150–400)
RBC: 4.51 MIL/uL (ref 3.87–5.11)
RDW: 14.6 % (ref 11.5–15.5)
WBC: 13.8 10*3/uL — ABNORMAL HIGH (ref 4.0–10.5)
nRBC: 0 % (ref 0.0–0.2)

## 2019-08-04 LAB — LIPASE, BLOOD: Lipase: 27 U/L (ref 11–51)

## 2019-08-04 LAB — COMPREHENSIVE METABOLIC PANEL
ALT: 15 U/L (ref 0–44)
AST: 15 U/L (ref 15–41)
Albumin: 3.5 g/dL (ref 3.5–5.0)
Alkaline Phosphatase: 97 U/L (ref 38–126)
Anion gap: 14 (ref 5–15)
BUN: 11 mg/dL (ref 6–20)
CO2: 22 mmol/L (ref 22–32)
Calcium: 9.1 mg/dL (ref 8.9–10.3)
Chloride: 102 mmol/L (ref 98–111)
Creatinine, Ser: 0.74 mg/dL (ref 0.44–1.00)
GFR calc Af Amer: >60 mL/min (ref >60)
GFR calc non Af Amer: >60 mL/min (ref >60)
Glucose, Bld: 162 mg/dL — ABNORMAL HIGH (ref 70–99)
Potassium: 3.4 mmol/L — ABNORMAL LOW (ref 3.5–5.1)
Sodium: 138 mmol/L (ref 135–145)
Total Bilirubin: 0.6 mg/dL (ref 0.3–1.2)
Total Protein: 6.9 g/dL (ref 6.5–8.1)

## 2019-08-04 LAB — I-STAT BETA HCG BLOOD, ED (MC, WL, AP ONLY): I-stat hCG, quantitative: <5 m[IU]/mL (ref <5)

## 2019-08-04 LAB — PREGNANCY, URINE: Preg Test, Ur: NEGATIVE

## 2019-08-04 MED ORDER — HYDROMORPHONE HCL 1 MG/ML IJ SOLN: 0.5000 mg | Freq: Once | INTRAMUSCULAR | Status: DC

## 2019-08-04 MED ORDER — IBUPROFEN 600 MG PO TABS: 600.0000 mg | ORAL_TABLET | Freq: Four times a day (QID) | ORAL | 0 refills | Status: DC | PRN

## 2019-08-04 MED ORDER — ONDANSETRON HCL 4 MG/2ML IJ SOLN
4.0000 mg | Freq: Once | INTRAMUSCULAR | Status: AC
  Administered 2019-08-04: 4 mg via INTRAVENOUS
  Filled 2019-08-04: qty 2

## 2019-08-04 MED ORDER — IOHEXOL 300 MG/ML  SOLN
100.0000 mL | Freq: Once | INTRAMUSCULAR | Status: AC | PRN
  Administered 2019-08-04: 100 mL via INTRAVENOUS

## 2019-08-04 MED ORDER — HYDROCODONE-ACETAMINOPHEN 5-325 MG PO TABS: 2.0000 | ORAL_TABLET | Freq: Four times a day (QID) | ORAL | 0 refills | Status: DC | PRN

## 2019-08-04 MED ORDER — HYDROMORPHONE HCL 1 MG/ML IJ SOLN
1.0000 mg | Freq: Once | INTRAMUSCULAR | Status: AC
  Administered 2019-08-04: 1 mg via INTRAVENOUS
  Filled 2019-08-04: qty 1

## 2019-08-04 NOTE — Consult Note (Signed)
[employer] [birth year]  [id number].    Requesting MD: Dr. Deno Etienne Chief Complaint/Reason for Consult: umbilical hernia  HPI:  This is a 44 yo white female with a history of HTN, obesity, daily marijuana use, who underwent a lap chole in January 2020 by Dr. Georgette Dover.  She tolerated that well but over the last year and a half she has occasionally noticed a lump around her umbilicus.  This has never seemed to bother her before until around 4 days ago.  She started having some abdominal pain at her umbilicus.  She has continued to pass flatus and had a BM a couple of days ago.  She has had more constipation than normal recently.  She also notes that because of this pain she has not been eating as much, but denies vomiting.  She smokes marijuana daily and states this has helped her pain some, but as it wore off, her pain returned.  She presented to the ED today for evaluation.  Her WBC was slightly elevated at 13K, but appears to be somewhat chronically elevated by previous labs.  She underwent a CT scan that reveals and umbilical hernia with some omentum present with some congestion noted of that area.  No bowel present in her hernia.  We were asked to see her secondary to pain.  ROS: ROS: Please see HPI, otherwise all other systems have been reviewed and are negative.  Family History  Problem Relation Age of Onset   Hypertension Other    Diabetes Mellitus II Other    Bipolar disorder Mother    Other Father        unaware of his PMH    Past Medical History:  Diagnosis Date   Chest pain    Colitis    Hypertension    Marijuana abuse    Morbid obesity ([place])    Panic attacks    Type 2 diabetes mellitus with hyperlipidemia ([place]) 12/22/2016    Past Surgical History:  Procedure Laterality Date   CHOLECYSTECTOMY N/A 02/21/2018   Procedure: LAPAROSCOPIC CHOLECYSTECTOMY WITH INTRAOPERATIVE CHOLANGIOGRAM;  Surgeon: Donnie Mesa, MD;  Location: [facility];  Service: General;   Laterality: N/A;   TUBAL LIGATION      Social History:  reports that she has never smoked. She has never used smokeless tobacco. She reports current drug use. Drug: Marijuana. She reports that she does not drink alcohol.  Allergies:  Allergies  Allergen Reactions   Morphine And Related Itching    (Not in a hospital admission)    Physical Exam: Blood pressure (!) 119/97, pulse 78, temperature 98.1 F (36.7 C), temperature source Oral, resp. rate 18, height 5\' 6"  (1.676 m), weight 108.9 kg, last menstrual period 07/30/2019, SpO2 94 %. General: pleasant, obese white female who is laying in bed in mild distress secondary to pain with palpation HEENT: head is normocephalic, atraumatic.  Sclera are noninjected.  PERRL.  Ears and nose without any masses or lesions.  Mouth is pink and moist Heart: regular, rate, and rhythm.  Normal s1,s2. No obvious murmurs, gallops, or rubs noted.  Palpable radial and pedal pulses bilaterally Lungs: CTAB, no wheezes, rhonchi, or rales noted.  Respiratory effort nonlabored Abd: soft, tender at umbilicus.  Small amount of omentum palpable when patient bears down, otherwise soft and not palpable when relaxed.  Patient is obese so difficult to feel the fascial defect, ND, +BS, no masses, or organomegaly MS: all 4 extremities are symmetrical with no cyanosis, clubbing, or  edema. Skin: warm and dry with no masses, lesions, or rashes Neuro: Cranial nerves 2-12 grossly intact, sensation is normal throughout Psych: A&Ox3 with an appropriate affect.   Results for orders placed or performed during the hospital encounter of 08/04/19 (from the past 48 hour(s))  Lipase, blood     Status: None   Collection Time: 08/04/19  7:55 AM  Result Value Ref Range   Lipase 27 11 - 51 U/L    Comment: Performed at [facility], [address], [place], [place] [address]  Comprehensive metabolic panel     Status: Abnormal   Collection Time: 08/04/19  7:55 AM  Result Value  Ref Range   Sodium 138 135 - 145 mmol/L   Potassium 3.4 (L) 3.5 - 5.1 mmol/L   Chloride 102 98 - 111 mmol/L   CO2 22 22 - 32 mmol/L   Glucose, Bld 162 (H) 70 - 99 mg/dL    Comment: Glucose reference range applies only to samples taken after fasting for at least 8 hours.   BUN 11 6 - 20 mg/dL   Creatinine, Ser 0.74 0.44 - 1.00 mg/dL   Calcium 9.1 8.9 - 10.3 mg/dL   Total Protein 6.9 6.5 - 8.1 g/dL   Albumin 3.5 3.5 - 5.0 g/dL   AST 15 15 - 41 U/L   ALT 15 0 - 44 U/L   Alkaline Phosphatase 97 38 - 126 U/L   Total Bilirubin 0.6 0.3 - 1.2 mg/dL   GFR calc non Af Amer >60 >60 mL/min   GFR calc Af Amer >60 >60 mL/min   Anion gap 14 5 - 15    Comment: Performed at [facility], [place] [address], [place], [place] [address]  CBC     Status: Abnormal   Collection Time: 08/04/19  7:55 AM  Result Value Ref Range   WBC 13.8 (H) 4.0 - 10.5 K/uL   RBC 4.51 3.87 - 5.11 MIL/uL   Hemoglobin 11.7 (L) 12.0 - 15.0 g/dL   HCT 38.4 36 - 46 %   MCV 85.1 80.0 - 100.0 fL   MCH 25.9 (L) 26.0 - 34.0 pg   MCHC 30.5 30.0 - 36.0 g/dL   RDW 14.6 11.5 - 15.5 %   Platelets 533 (H) 150 - 400 K/uL   nRBC 0.0 0.0 - 0.2 %    Comment: Performed at [facility], [place] [address], [place], [place] [address]  I-Stat beta hCG blood, ED     Status: None   Collection Time: 08/04/19  8:04 AM  Result Value Ref Range   I-stat hCG, quantitative <5.0 <5 mIU/mL   Comment 3            Comment:   GEST. AGE      CONC.  (mIU/mL)   <=1 WEEK        5 - 50     2 WEEKS       50 - 500     3 WEEKS       100 - 10,000     4 WEEKS     1,000 - 30,000        FEMALE AND NON-PREGNANT FEMALE:     LESS THAN 5 mIU/mL   Urinalysis, Routine w reflex microscopic     Status: Abnormal   Collection Time: 08/04/19  8:05 AM  Result Value Ref Range   Color, Urine YELLOW YELLOW   APPearance CLOUDY (A) CLEAR   Specific Gravity,  Urine 1.012 1.005 - 1.030   pH 7.0 5.0 - 8.0   Glucose, UA NEGATIVE NEGATIVE mg/dL   Hgb urine dipstick SMALL  (A) NEGATIVE   Bilirubin Urine NEGATIVE NEGATIVE   Ketones, ur NEGATIVE NEGATIVE mg/dL   Protein, ur NEGATIVE NEGATIVE mg/dL   Nitrite NEGATIVE NEGATIVE   Leukocytes,Ua NEGATIVE NEGATIVE   RBC / HPF 0-5 0 - 5 RBC/hpf   WBC, UA 6-10 0 - 5 WBC/hpf   Bacteria, UA FEW (A) NONE SEEN   Squamous Epithelial / LPF 21-50 0 - 5   Mucus PRESENT    Amorphous Siboney PRESENT     Comment: Performed at [facility], [place] [address] [place], [place] [address]  Pregnancy, urine     Status: None   Collection Time: 08/04/19  8:46 AM  Result Value Ref Range   Preg Test, Ur NEGATIVE NEGATIVE    Comment:        THE SENSITIVITY OF THIS METHODOLOGY IS >20 mIU/mL. Performed at [facility], [place] [address] [place], [place] [address]    CT ABDOMEN PELVIS W CONTRAST  Result Date: 08/04/2019 CLINICAL DATA:  44 year old female with history of lower abdominal pain with nausea, vomiting and diarrhea. EXAM: CT ABDOMEN AND PELVIS WITH CONTRAST TECHNIQUE: Multidetector CT imaging of the abdomen and pelvis was performed using the standard protocol following bolus administration of intravenous contrast. CONTRAST:  135mL OMNIPAQUE IOHEXOL 300 MG/ML  SOLN COMPARISON:  CT the abdomen and pelvis 10/02/2018. FINDINGS: Lower chest: Unremarkable. Hepatobiliary: No suspicious cystic or solid hepatic lesions. No intra or extrahepatic biliary ductal dilatation. Status post cholecystectomy. Pancreas: No pancreatic mass. No pancreatic ductal dilatation. No pancreatic or peripancreatic fluid collections or inflammatory changes. Spleen: Unremarkable. Adrenals/Urinary Tract: Subcentimeter low-attenuation lesion in the lower pole of the left kidney, too small to characterize, but statistically likely to represent a cyst. Right kidney and bilateral adrenal glands are normal in appearance. No hydroureteronephrosis. Urinary bladder is normal in appearance. Stomach/Bowel: Normal appearance of the stomach. No pathologic dilatation of  small bowel or colon. Normal appendix. Vascular/Lymphatic: Aortic atherosclerosis, without evidence of aneurysm or dissection in the abdominal or pelvic vasculature. No lymphadenopathy noted in the abdomen or pelvis. Reproductive: Uterus and ovaries are unremarkable in appearance. Other: Enlarging periumbilical ventral hernia containing omental fat, with mild haziness and soft tissue stranding in the fat within the hernia and in the adjacent intraperitoneal omentum. Trace volume of free fluid in the cul-de-sac, likely physiologic in this young female patient. No pneumoperitoneum. Musculoskeletal: There are no aggressive appearing lytic or blastic lesions noted in the visualized portions of the skeleton. IMPRESSION: 1. Enlarging periumbilical ventral hernia containing omental fat with inflammatory changes in the fat of the omentum both within and adjacent to the hernia, which suggests inflammation or potential vascular engorgement from incarceration. Surgical consultation is recommended. 2. Trace volume of free fluid in the cul-de-sac, likely physiologic in this young female patient. 3. Normal appendix. Electronically Signed   By: Vinnie Langton M.D.   On: 08/04/2019 11:41      Assessment/Plan Umbilical hernia The patient has an incisional umbilical hernia secondary to her prior cholecystectomy.  She has omentum present in this on her CT scan.  This is edematous with some congestion and inflammation.  This feels as if it reduces at least some on exam; however, given the patient's body habitus and current pain, I am unable to feel the fascial defect.  The mouth of her hernia is wide and she  has low risk of incarceration.  Her omentum may be stuck to the hernia sac and therefore inflamed, but no need for emergent hernia repair or admission.  We have recommended that she can follow up with Dr. Georgette Dover to discuss hernia repair.  She is surgically stable for DC from the ED.    Henreitta Cea, [facility] Surgery 08/04/2019, 2:01 PM Please see Amion for pager number during day hours 7:00am-4:30pm or 7:00am -11:30am on weekends

## 2019-08-04 NOTE — Discharge Instructions (Signed)
Umbilical Hernia, Adult.  You do not have intestine in your hernia, just the fatty layer called omentum.  A hernia is a bulge of tissue that pushes through an opening between muscles. An umbilical hernia happens in the abdomen, near the belly button (umbilicus). The hernia may contain tissues from the small intestine, large intestine, or fatty tissue covering the intestines (omentum). Umbilical hernias in adults tend to get worse over time, and they require surgical treatment. There are several types of umbilical hernias. You may have:  A hernia located just above or below the umbilicus (indirect hernia). This is the most common type of umbilical hernia in adults.  A hernia that forms through an opening formed by the umbilicus (direct hernia).  A hernia that comes and goes (reducible hernia). A reducible hernia may be visible only when you strain, lift something heavy, or cough. This type of hernia can be pushed back into the abdomen (reduced).  A hernia that traps abdominal tissue inside the hernia (incarcerated hernia). This type of hernia cannot be reduced.  A hernia that cuts off blood flow to the tissues inside the hernia (strangulated hernia). The tissues can start to die if this happens. This type of hernia requires emergency treatment. What are the causes? An umbilical hernia happens when tissue inside the abdomen presses on a weak area of the abdominal muscles. What increases the risk? You may have a greater risk of this condition if you:  Are obese.  Have had several pregnancies.  Have a buildup of fluid inside your abdomen (ascites).  Have had surgery that weakens the abdominal muscles. What are the signs or symptoms? The main symptom of this condition is a painless bulge at or near the belly button. A reducible hernia may be visible only when you strain, lift something heavy, or cough. Other symptoms may include:  Dull pain.  A feeling of pressure. Symptoms of a  strangulated hernia may include:  Pain that gets increasingly worse.  Nausea and vomiting.  Pain when pressing on the hernia.  Skin over the hernia becoming red or purple.  Constipation.  Blood in the stool. How is this diagnosed? This condition may be diagnosed based on:  A physical exam. You may be asked to cough or strain while standing. These actions increase the pressure inside your abdomen and force the hernia through the opening in your muscles. Your health care provider may try to reduce the hernia by pressing on it.  Your symptoms and medical history. How is this treated? Surgery is the only treatment for an umbilical hernia. Surgery for a strangulated hernia is done as soon as possible. If you have a small hernia that is not incarcerated, you may need to lose weight before having surgery. Follow these instructions at home:  Lose weight, if told by your health care provider.  Do not try to push the hernia back in.  Watch your hernia for any changes in color or size. Tell your health care provider if any changes occur.  You may need to avoid activities that increase pressure on your hernia.  Do not lift anything that is heavier than 10 lb (4.5 kg) until your health care provider says that this is safe.  Take over-the-counter and prescription medicines only as told by your health care provider.  Keep all follow-up visits as told by your health care provider. This is important. Contact a health care provider if:  Your hernia gets larger.  Your hernia becomes painful. Get help  right away if:  You develop sudden, severe pain near the area of your hernia.  You have pain as well as nausea or vomiting.  You have pain and the skin over your hernia changes color.  You develop a fever. This information is not intended to replace advice given to you by your health care provider. Make sure you discuss any questions you have with your health care provider. Document  Revised: 03/10/2017 Document Reviewed: 07/27/2016 Elsevier Patient Education  [employer] [place].

## 2019-08-04 NOTE — ED Provider Notes (Signed)
[facility] EMERGENCY DEPARTMENT Provider Note   CSN: [id number] Arrival date & time: 08/04/19  2297     History Chief Complaint  Patient presents with  . Abdominal Pain    Cassandra Greer is a 44 y.o. female.  The history is provided by the patient. No language interpreter was used.  Abdominal Pain Pain location:  Generalized Pain quality: aching and stabbing   Pain radiates to:  Does not radiate Pain severity:  Moderate Onset quality:  Gradual Timing:  Constant Progression:  Worsening Chronicity:  New Relieved by:  Nothing Worsened by:  Nothing Ineffective treatments:  None tried Associated symptoms: no chest pain and no fever   Pt complains of mid abdominal pain.  Pt has had pain in the same area but never this severe.      Past Medical History:  Diagnosis Date  . Chest pain   . Colitis   . Hypertension   . Marijuana abuse   . Morbid obesity ([place])   . Panic attacks   . Type 2 diabetes mellitus with hyperlipidemia ([place]) 12/22/2016    Patient Active Problem List   Diagnosis Date Noted  . Abdominal pain 03/16/2018  . Hypomagnesemia 03/16/2018  . Hypertensive urgency 03/15/2018  . Acute cholecystitis due to biliary calculus 02/20/2018  . Generalized anxiety disorder with panic attacks 12/24/2016  . Type 2 diabetes mellitus with hyperlipidemia ([place]) 12/22/2016  . Hyperlipidemia 12/22/2016  . Chest pain 12/21/2016  . Accelerated hypertension 12/21/2016  . Hyperglycemia 12/21/2016  . Epigastric abdominal pain 12/21/2016  . Fatty liver disease, nonalcoholic 98/92/1194  . Thrombocytosis ([place]) 12/21/2016  . Panic disorder (episodic paroxysmal anxiety) 12/21/2016  . HTN (hypertension) 03/08/2013  . Hypokalemia 03/08/2013  . Headache(784.0) 03/08/2013  . Colitis 03/05/2013  . Leukocytosis 03/05/2013  . Sinusitis 03/05/2013    Past Surgical History:  Procedure Laterality Date  . CHOLECYSTECTOMY N/A 02/21/2018   Procedure:  LAPAROSCOPIC CHOLECYSTECTOMY WITH INTRAOPERATIVE CHOLANGIOGRAM;  Surgeon: Donnie Mesa, MD;  Location: [facility];  Service: General;  Laterality: N/A;  . TUBAL LIGATION       OB History   No obstetric history on file.     Family History  Problem Relation Age of Onset  . Hypertension Other   . Diabetes Mellitus II Other   . Bipolar disorder Mother   . Other Father        unaware of his PMH    Social History   Tobacco Use  . Smoking status: Never Smoker  . Smokeless tobacco: Never Used  Vaping Use  . Vaping Use: Never used  Substance Use Topics  . Alcohol use: No  . Drug use: Yes    Types: Marijuana    Comment: 2-3 x/month    Home Medications Prior to Admission medications   Medication Sig Start Date End Date Taking? Authorizing Provider  amLODipine (NORVASC) 5 MG tablet Take 10 mg by mouth daily.   Yes [provider]  atorvastatin (LIPITOR) 20 MG tablet Take 20 mg by mouth daily.   Yes [provider]  famotidine (PEPCID) 20 MG tablet Take 1 tablet (20 mg total) by mouth 2 (two) times daily. 10/02/18 10/02/19 Yes Merlyn Lot, MD  hydrochlorothiazide (HYDRODIURIL) 25 MG tablet Take 0.5 tablets (12.5 mg total) by mouth daily. Patient taking differently: Take 6.25 mg by mouth daily.  03/17/18  Yes Roxan Hockey, MD  hydrOXYzine (ATARAX/VISTARIL) 25 MG tablet Take 1 tablet (25 mg total) by mouth 3 (three) times daily as needed  for anxiety. 03/17/18  Yes Emokpae, Courage, MD  polyethylene glycol (MIRALAX / GLYCOLAX) packet Take 17 g by mouth daily as needed for mild constipation or moderate constipation. 03/17/18  Yes Emokpae, Courage, MD  QUEtiapine (SEROQUEL) 50 MG tablet Take 50-100 mg by mouth See admin instructions. 50mg  in am and 100mg  at night   Yes [provider]  lisinopril (ZESTRIL) 40 MG tablet Take 40 mg by mouth daily.  Patient not taking: Reported on 08/04/2019    [provider]  ondansetron (ZOFRAN-ODT) 4 MG disintegrating  tablet Take 1 tablet (4 mg total) by mouth every 6 (six) hours as needed for nausea or vomiting. Patient not taking: Reported on 09/29/2018 03/17/18   Roxan Hockey, MD  oxyCODONE (OXY IR/ROXICODONE) 5 MG immediate release tablet Take 1 tablet (5 mg total) by mouth every 6 (six) hours as needed. Patient not taking: Reported on 09/29/2018 03/17/18   Roxan Hockey, MD  promethazine (PHENERGAN) 12.5 MG tablet Take 1 tablet (12.5 mg total) by mouth every 6 (six) hours as needed for nausea or vomiting. Patient not taking: Reported on 08/04/2019 10/02/18   Merlyn Lot, MD    Allergies    Morphine and related  Review of Systems   Review of Systems  Constitutional: Negative for fever.  Cardiovascular: Negative for chest pain.  Gastrointestinal: Positive for abdominal pain.  All other systems reviewed and are negative.   Physical Exam Updated Vital Signs BP (!) 119/97   Pulse 79   Temp 98.1 F (36.7 C) (Oral)   Resp 18   Ht 5\' 6"  (1.676 m)   Wt 108.9 kg   LMP 07/30/2019   SpO2 98%   BMI 38.74 kg/m   Physical Exam Vitals and nursing note reviewed.  Constitutional:      Appearance: She is well-developed.  HENT:     Head: Normocephalic.  Pulmonary:     Effort: Pulmonary effort is normal.  Abdominal:     General: Bowel sounds are normal. There is no distension.     Tenderness: There is abdominal tenderness.     Hernia: A hernia is present. Hernia is present in the umbilical area.  Musculoskeletal:        General: Normal range of motion.     Cervical back: Normal range of motion.  Skin:    General: Skin is warm.  Neurological:     General: No focal deficit present.     Mental Status: She is alert and oriented to person, place, and time.     ED Results / Procedures / Treatments   Labs (all labs ordered are listed, but only abnormal results are displayed) Labs Reviewed  COMPREHENSIVE METABOLIC PANEL - Abnormal; Notable for the following components:      Result Value     Potassium 3.4 (*)    Glucose, Bld 162 (*)    All other components within normal limits  CBC - Abnormal; Notable for the following components:   WBC 13.8 (*)    Hemoglobin 11.7 (*)    MCH 25.9 (*)    Platelets 533 (*)    All other components within normal limits  URINALYSIS, ROUTINE W REFLEX MICROSCOPIC - Abnormal; Notable for the following components:   APPearance CLOUDY (*)    Hgb urine dipstick SMALL (*)    Bacteria, UA FEW (*)    All other components within normal limits  LIPASE, BLOOD  PREGNANCY, URINE  I-STAT BETA HCG BLOOD, ED ([facility], [facility], [facility] ONLY)    EKG  None  Radiology CT ABDOMEN PELVIS W CONTRAST  Result Date: 08/04/2019 CLINICAL DATA:  44 year old female with history of lower abdominal pain with nausea, vomiting and diarrhea. EXAM: CT ABDOMEN AND PELVIS WITH CONTRAST TECHNIQUE: Multidetector CT imaging of the abdomen and pelvis was performed using the standard protocol following bolus administration of intravenous contrast. CONTRAST:  161mL OMNIPAQUE IOHEXOL 300 MG/ML  SOLN COMPARISON:  CT the abdomen and pelvis 10/02/2018. FINDINGS: Lower chest: Unremarkable. Hepatobiliary: No suspicious cystic or solid hepatic lesions. No intra or extrahepatic biliary ductal dilatation. Status post cholecystectomy. Pancreas: No pancreatic mass. No pancreatic ductal dilatation. No pancreatic or peripancreatic fluid collections or inflammatory changes. Spleen: Unremarkable. Adrenals/Urinary Tract: Subcentimeter low-attenuation lesion in the lower pole of the left kidney, too small to characterize, but statistically likely to represent a cyst. Right kidney and bilateral adrenal glands are normal in appearance. No hydroureteronephrosis. Urinary bladder is normal in appearance. Stomach/Bowel: Normal appearance of the stomach. No pathologic dilatation of small bowel or colon. Normal appendix. Vascular/Lymphatic: Aortic atherosclerosis, without evidence of aneurysm or dissection in the abdominal or  pelvic vasculature. No lymphadenopathy noted in the abdomen or pelvis. Reproductive: Uterus and ovaries are unremarkable in appearance. Other: Enlarging periumbilical ventral hernia containing omental fat, with mild haziness and soft tissue stranding in the fat within the hernia and in the adjacent intraperitoneal omentum. Trace volume of free fluid in the cul-de-sac, likely physiologic in this young female patient. No pneumoperitoneum. Musculoskeletal: There are no aggressive appearing lytic or blastic lesions noted in the visualized portions of the skeleton. IMPRESSION: 1. Enlarging periumbilical ventral hernia containing omental fat with inflammatory changes in the fat of the omentum both within and adjacent to the hernia, which suggests inflammation or potential vascular engorgement from incarceration. Surgical consultation is recommended. 2. Trace volume of free fluid in the cul-de-sac, likely physiologic in this young female patient. 3. Normal appendix. Electronically Signed   By: Vinnie Langton M.D.   On: 08/04/2019 11:41    Procedures Procedures (including critical care time)  Medications Ordered in ED Medications  HYDROmorphone (DILAUDID) injection 1 mg (1 mg Intravenous Given 08/04/19 0910)  ondansetron (ZOFRAN) injection 4 mg (4 mg Intravenous Given 08/04/19 0910)  iohexol (OMNIPAQUE) 300 MG/ML solution 100 mL (100 mLs Intravenous Contrast Given 08/04/19 1115)    ED Course  I have reviewed the triage vital signs and the nursing notes.  Pertinent labs & imaging results that were available during my care of the patient were reviewed by me and considered in my medical decision making (see chart for details).    MDM Rules/Calculators/A&P                          MDM:  Ct scan shows omentum hernia, Radiologist advised surgery consult.  Saverio Danker PA here to see and examine.   Pt given RX for hydrocodone and ibuprofen.  Pt advised to schedule to see surgeon for evaluation  Final  Clinical Impression(s) / ED Diagnoses Final diagnoses:  Hernia of abdominal wall    Rx / DC Orders ED Discharge Orders         Ordered    ibuprofen (ADVIL) 600 MG tablet  Every 6 hours PRN     Discontinue  Reprint     08/04/19 1357    HYDROcodone-acetaminophen (NORCO/VICODIN) 5-325 MG tablet  Every 6 hours PRN     Discontinue  Reprint     08/04/19 1357  Fransico Meadow, PA-C 08/04/19 1358    Deno Etienne, DO 08/04/19 [place], DO 08/04/19 1402

## 2019-08-04 NOTE — ED Triage Notes (Signed)
Pt reports mid abd pain for the past year since getting her gallbladder removed but pain has worsened since tuesday. Nausea but no vomiting. No changes in bowel movements. Pt a.o

## 2019-09-05 ENCOUNTER — Telehealth: Payer: Self-pay | Admitting: Family Medicine

## 2019-09-05 NOTE — Telephone Encounter (Signed)
Attempted to reach patient about scheduling an appointment. Left a message for her to call us to schedule.

## 2019-09-18 ENCOUNTER — Ambulatory Visit: Payer: Self-pay | Admitting: Surgery

## 2019-09-18 NOTE — H&P (Signed)
History of Present Illness Cassandra Greer. Cassandra Hase MD; 09/18/2019 10:35 AM) The patient is a 44 year old female who presents with an umbilical hernia. Status post laparoscopic cholecystectomy with cholangiogram for acute cholecystitis in January 2020. Over the last several months, the patient has developed some swelling and discomfort at her umbilicus. She had one severe episode that resulted in a trip to the emergency department. A CT scan showed a umbilical incisional hernia containing some omentum. There is no bowel present within the hernia. He remains reducible. She presents now to discuss hernia repair.  CLINICAL DATA: 44 year old female with history of lower abdominal pain with nausea, vomiting and diarrhea.  EXAM: CT ABDOMEN AND PELVIS WITH CONTRAST  TECHNIQUE: Multidetector CT imaging of the abdomen and pelvis was performed using the standard protocol following bolus administration of intravenous contrast.  CONTRAST: 182mL OMNIPAQUE IOHEXOL 300 MG/ML SOLN  COMPARISON: CT the abdomen and pelvis 10/02/2018.  FINDINGS: Lower chest: Unremarkable.  Hepatobiliary: No suspicious cystic or solid hepatic lesions. No intra or extrahepatic biliary ductal dilatation. Status post cholecystectomy.  Pancreas: No pancreatic mass. No pancreatic ductal dilatation. No pancreatic or peripancreatic fluid collections or inflammatory changes.  Spleen: Unremarkable.  Adrenals/Urinary Tract: Subcentimeter low-attenuation lesion in the lower pole of the left kidney, too small to characterize, but statistically likely to represent a cyst. Right kidney and bilateral adrenal glands are normal in appearance. No hydroureteronephrosis. Urinary bladder is normal in appearance.  Stomach/Bowel: Normal appearance of the stomach. No pathologic dilatation of small bowel or colon. Normal appendix.  Vascular/Lymphatic: Aortic atherosclerosis, without evidence of aneurysm or dissection in the abdominal  or pelvic vasculature. No lymphadenopathy noted in the abdomen or pelvis.  Reproductive: Uterus and ovaries are unremarkable in appearance.  Other: Enlarging periumbilical ventral hernia containing omental fat, with mild haziness and soft tissue stranding in the fat within the hernia and in the adjacent intraperitoneal omentum. Trace volume of free fluid in the cul-de-sac, likely physiologic in this young female patient. No pneumoperitoneum.  Musculoskeletal: There are no aggressive appearing lytic or blastic lesions noted in the visualized portions of the skeleton.  IMPRESSION: 1. Enlarging periumbilical ventral hernia containing omental fat with inflammatory changes in the fat of the omentum both within and adjacent to the hernia, which suggests inflammation or potential vascular engorgement from incarceration. Surgical consultation is recommended. 2. Trace volume of free fluid in the cul-de-sac, likely physiologic in this young female patient. 3. Normal appendix.   Electronically Signed By: Cassandra Greer M.D. On: 08/04/2019 11:41     Problem List/Past Medical Cassandra Key K. Ellwood Steidle, MD; 02/14/1094 04:54 AM) UMBILICAL HERNIA WITHOUT OBSTRUCTION OR GANGRENE (K42.9)  Past Surgical History Cassandra Lorenzo, LPN; 0/10/8117 14:78 AM) Gallbladder Surgery - Laparoscopic  Diagnostic Studies History Cassandra Lorenzo, LPN; 03/21/5619 30:86 AM) Colonoscopy never Mammogram never Pap Smear >5 years ago  Allergies Cassandra Lorenzo, LPN; 06/16/8467 62:95 AM) No Known Drug Allergies [09/18/2019]: Allergies Reconciled  Medication History Cassandra Lorenzo, LPN; 03/19/4130 44:01 AM) Norvasc (10MG  Tablet, Oral) Active. Lisinopril (40MG  Tablet, Oral) Active. Lipitor (20MG  Tablet, Oral) Active. SEROquel (50MG  Tablet, Oral) Active. Pepcid AC (10MG  Tablet, Oral) Active. hydroCHLOROthiazide (12.5MG  Tablet, Oral) Active. MiraLax (17GM/SCOOP Powder, Oral) Active. Medications  Reconciled  Social History Cassandra Lorenzo, LPN; 0/03/7251 66:44 AM) Alcohol use Occasional alcohol use. Caffeine use Carbonated beverages, Tea. Illicit drug use Prefer to discuss with provider. Tobacco use Never smoker.  Family History Cassandra Lorenzo, LPN; 0/04/4740 59:56 AM) Arthritis Mother.  Pregnancy / Birth History Cassandra Lorenzo, LPN; 04/17/7562 33:29 AM)  Age at menarche 76 years. Gravida 3 Length (months) of breastfeeding 3-6 Maternal age 68-20 Para 3 Regular periods  Other Problems Cassandra Greer. Cassandra Soltau, MD; 09/18/2019 10:35 AM) Anxiety Disorder Depression Gastroesophageal Reflux Disease     Review of Systems Claiborne Billings [facility] LPN; 08/09/2456 09:98 AM) General Not Present- Appetite Loss, Chills, Fatigue, Fever, Night Sweats, Weight Gain and Weight Loss. Skin Not Present- Change in Wart/Mole, Dryness, Hives, Jaundice, New Lesions, Non-Healing Wounds, Rash and Ulcer. HEENT Present- Wears glasses/contact lenses. Not Present- Earache, Hearing Loss, Hoarseness, Nose Bleed, Oral Ulcers, Ringing in the Ears, Seasonal Allergies, Sinus Pain, Sore Throat, Visual Disturbances and Yellow Eyes. Respiratory Present- Snoring. Not Present- Bloody sputum, Chronic Cough, Difficulty Breathing and Wheezing. Breast Not Present- Breast Mass, Breast Pain, Nipple Discharge and Skin Changes. Cardiovascular Not Present- Chest Pain, Difficulty Breathing Lying Down, Leg Cramps, Palpitations, Rapid Heart Rate, Shortness of Breath and Swelling of Extremities. Gastrointestinal Not Present- Abdominal Pain, Bloating, Bloody Stool, Change in Bowel Habits, Chronic diarrhea, Constipation, Difficulty Swallowing, Excessive gas, Gets full quickly at meals, Hemorrhoids, Indigestion, Nausea, Rectal Pain and Vomiting. Female Genitourinary Not Present- Frequency, Nocturia, Painful Urination, Pelvic Pain and Urgency. Musculoskeletal Not Present- Back Pain, Joint Pain, Joint Stiffness, Muscle Pain, Muscle Weakness  and Swelling of Extremities. Neurological Not Present- Decreased Memory, Fainting, Headaches, Numbness, Seizures, Tingling, Tremor, Trouble walking and Weakness. Endocrine Not Present- Cold Intolerance, Excessive Hunger, Hair Changes, Heat Intolerance, Hot flashes and New Diabetes. Hematology Not Present- Blood Thinners, Easy Bruising, Excessive bleeding, Gland problems, HIV and Persistent Infections.  Vitals Claiborne Billings [place] LPN; 04/11/8248 53:97 AM) 09/18/2019 10:27 AM Weight: 241.6 lb Height: 66in Body Surface Area: 2.17 m Body Mass Index: 38.99 kg/m  Temp.: 54F(Thermal Scan)  Pulse: 105 (Regular)  BP: 128/82(Sitting, Left Arm, Standard)        Physical Exam Cassandra Key K. Rajohn Henery MD; 09/18/2019 10:35 AM)  The physical exam findings are as follows: Note:Constitutional: WDWN in NAD, conversant, no obvious deformities; resting comfortably Eyes: Pupils equal, round; sclera anicteric; moist conjunctiva; no lid lag HENT: Oral mucosa moist; good dentition Neck: No masses palpated, trachea midline; no thyromegaly Lungs: CTA bilaterally; normal respiratory effort CV: Regular rate and rhythm; no murmurs; extremities well-perfused with no edema Abd: +bowel sounds, soft, non-tender, no palpable organomegaly; well-healed laparoscopic incisions; palpable reducible hernia just above umbilicus - cannot palpate defect due to body habitus Musc: Normal gait; no apparent clubbing or cyanosis in extremities Lymphatic: No palpable cervical or axillary lymphadenopathy Skin: Warm, dry; no sign of jaundice Psychiatric - alert and oriented x 4; calm mood and affect    Assessment & Plan Cassandra Key K. Pharell Rolfson MD; 07/16/3417 37:90 AM)  UMBILICAL HERNIA WITHOUT OBSTRUCTION OR GANGRENE (K42.9)  Current Plans Schedule for Surgery - Umbilical hernia repair with mesh. The surgical procedure has been discussed with the patient. Potential risks, benefits, alternative treatments, and expected outcomes have  been explained. All of the patient's questions at this time have been answered. The likelihood of reaching the patient's treatment goal is good. The patient understand the proposed surgical procedure and wishes to proceed.  Cassandra Greer. Georgette Dover, MD, [facility]  General/ Trauma Surgery   09/18/2019 10:35 AM

## 2019-10-12 ENCOUNTER — Encounter: Payer: Self-pay | Admitting: *Deleted

## 2019-10-19 ENCOUNTER — Other Ambulatory Visit: Payer: Self-pay

## 2019-10-19 ENCOUNTER — Encounter: Payer: Self-pay | Admitting: Obstetrics & Gynecology

## 2019-10-19 ENCOUNTER — Other Ambulatory Visit (HOSPITAL_COMMUNITY)
Admission: RE | Admit: 2019-10-19 | Discharge: 2019-10-19 | Disposition: A | Discharge status: Home / Self Care | Payer: Medicaid Other | Source: Ambulatory Visit | Attending: Obstetrics & Gynecology | Admitting: Obstetrics & Gynecology

## 2019-10-19 ENCOUNTER — Ambulatory Visit (INDEPENDENT_AMBULATORY_CARE_PROVIDER_SITE_OTHER): Payer: Medicaid Other | Admitting: Obstetrics & Gynecology

## 2019-10-19 VITALS — BP 120/83 | HR 81 | Wt 239.1 lb

## 2019-10-19 DIAGNOSIS — N920 Excessive and frequent menstruation with regular cycle: Secondary | ICD-10-CM

## 2019-10-19 DIAGNOSIS — Z0000 Encounter for general adult medical examination without abnormal findings: Secondary | ICD-10-CM | POA: Insufficient documentation

## 2019-10-19 DIAGNOSIS — Z30430 Encounter for insertion of intrauterine contraceptive device: Secondary | ICD-10-CM | POA: Diagnosis not present

## 2019-10-19 DIAGNOSIS — Z3202 Encounter for pregnancy test, result negative: Secondary | ICD-10-CM

## 2019-10-19 LAB — POCT PREGNANCY, URINE: Preg Test, Ur: NEGATIVE

## 2019-10-19 MED ORDER — LEVONORGESTREL 19.5 MCG/DAY IU IUD
INTRAUTERINE_SYSTEM | Freq: Once | INTRAUTERINE | Status: AC
  Administered 2019-10-19: 1 via INTRAUTERINE

## 2019-10-19 NOTE — Progress Notes (Signed)
GYNECOLOGY ANNUAL PREVENTATIVE CARE ENCOUNTER NOTE  History:     Hetty Brenya Taulbee is a 44 y.o. [phone] female here for a routine annual gynecologic exam.  Current complaints: chronic menorrhagia, and dysmenorrhea for multiple years, has not tried hormonal therapy, uses ibuprofen at beginning of cycles otherwise no.   Denies discharge, problems with intercourse or other gynecologic concerns.    Gynecologic History Patient's last menstrual period was 09/28/2019 (exact date). Contraception: none Last Pap: over 15 years ago. Results were: unsure Last mammogram: none.  Obstetric History OB History  Gravida Para Term Preterm AB Living  5 3 3  0 2 3  SAB TAB Ectopic Multiple Live Births  0 2 0 0 3    # Outcome Date GA Lbr Len/2nd Weight Sex Delivery Anes PTL Lv  5 Term      Vag-Spont     4 Term      Vag-Spont     3 Term      Vag-Spont     2 TAB           1 TAB             Past Medical History:  Diagnosis Date  . Chest pain   . Colitis   . Hypertension   . Marijuana abuse   . Morbid obesity ([place])   . Panic attacks   . Type 2 diabetes mellitus with hyperlipidemia ([place]) 12/22/2016    Past Surgical History:  Procedure Laterality Date  . CHOLECYSTECTOMY N/A 02/21/2018   Procedure: LAPAROSCOPIC CHOLECYSTECTOMY WITH INTRAOPERATIVE CHOLANGIOGRAM;  Surgeon: Donnie Mesa, MD;  Location: [facility];  Service: General;  Laterality: N/A;  . TUBAL LIGATION      Current Outpatient Medications on File Prior to Visit  Medication Sig Dispense Refill  . amLODipine (NORVASC) 5 MG tablet Take 10 mg by mouth daily.    Marland Kitchen atorvastatin (LIPITOR) 20 MG tablet Take 20 mg by mouth daily.    . famotidine (PEPCID) 20 MG tablet Take 1 tablet (20 mg total) by mouth 2 (two) times daily. 60 tablet 1  . hydrochlorothiazide (HYDRODIURIL) 25 MG tablet Take 0.5 tablets (12.5 mg total) by mouth daily. (Patient taking differently: Take 6.25 mg by mouth daily. ) 30 tablet 1  . hydrOXYzine (ATARAX/VISTARIL) 25  MG tablet Take 1 tablet (25 mg total) by mouth 3 (three) times daily as needed for anxiety. 30 tablet 0  . ibuprofen (ADVIL) 600 MG tablet Take 1 tablet (600 mg total) by mouth every 6 (six) hours as needed. 30 tablet 0  . lisinopril (ZESTRIL) 40 MG tablet Take 40 mg by mouth daily.     . ondansetron (ZOFRAN-ODT) 4 MG disintegrating tablet Take 1 tablet (4 mg total) by mouth every 6 (six) hours as needed for nausea or vomiting. 12 tablet 0  . polyethylene glycol (MIRALAX / GLYCOLAX) packet Take 17 g by mouth daily as needed for mild constipation or moderate constipation. 14 each 0  . promethazine (PHENERGAN) 12.5 MG tablet Take 1 tablet (12.5 mg total) by mouth every 6 (six) hours as needed for nausea or vomiting. 12 tablet 0  . QUEtiapine (SEROQUEL) 50 MG tablet Take 50-100 mg by mouth See admin instructions. 50mg  in am and 100mg  at night     No current facility-administered medications on file prior to visit.    Allergies  Allergen Reactions  . Morphine And Related Itching    Social History:  reports that she has never smoked. She has never used  smokeless tobacco. She reports current drug use. Drug: Marijuana. She reports that she does not drink alcohol.  Family History  Problem Relation Age of Onset  . Hypertension Other   . Diabetes Mellitus II Other   . Bipolar disorder Mother   . Other Father        unaware of his PMH    The following portions of the patient's history were reviewed and updated as appropriate: allergies, current medications, past family history, past medical history, past social history, past surgical history and problem list.  Review of Systems Pertinent items noted in HPI and remainder of comprehensive ROS otherwise negative.  Physical Exam:  BP 120/83   Pulse 81   Wt 239 lb 1.6 oz (108.5 kg)   LMP 09/28/2019 (Exact Date)   BMI 38.59 kg/m  CONSTITUTIONAL: Well-developed, well-nourished female in no acute distress.  HENT:  Normocephalic, atraumatic,  External right and left ear normal. Oropharynx is clear and moist EYES: Conjunctivae and EOM are normal. Pupils are equal, round, and reactive to light. No scleral icterus.  NECK: Normal range of motion, supple, no masses.  Normal thyroid.  SKIN: Skin is warm and dry. No rash noted. Not diaphoretic. No erythema. No pallor. MUSCULOSKELETAL: Normal range of motion. No tenderness.  No cyanosis, clubbing, or edema.  2+ distal pulses. NEUROLOGIC: Alert and oriented to person, place, and time. Normal reflexes, muscle tone coordination.  PSYCHIATRIC: Normal mood and affect. Normal behavior. Normal judgment and thought content. CARDIOVASCULAR: Normal heart rate noted, regular rhythm RESPIRATORY: Clear to auscultation bilaterally. Effort and breath sounds normal, no problems with respiration noted. BREASTS: Symmetric in size. No masses, tenderness, skin changes, nipple drainage, or lymphadenopathy bilaterally. Performed in the presence of a chaperone. ABDOMEN: Soft, no distention noted.  No tenderness, rebound or guarding.  PELVIC: Normal appearing external genitalia and urethral meatus; normal appearing vaginal mucosa and cervix.  No abnormal discharge noted.  Pap smear obtained.  Normal uterine size, no other palpable masses, no uterine or adnexal tenderness.  Performed in the presence of a chaperone.   Assessment and Plan:     Annual exam Menorrhagia and dysmenorrhea- discussed options including medical management, pt desires IUD. Will follow up for string check and 6 months, if bleeding unchanged  At that time, consider EMB at that time.  Will follow up results of pap smear and manage accordingly. Mammogram scheduled Routine preventative health maintenance measures emphasized. Please refer to After Visit Summary for other counseling recommendations.      GYNECOLOGY OFFICE PROCEDURE NOTE  Grizelda Armenia Silveria is a 44 y.o. [phone] here for [place] IUD insertion. No GYN concerns.  Last pap smear was  on 10/19/19 and result pending.  IUD Insertion Procedure Note Patient identified, informed consent performed, consent signed.   Discussed risks of irregular bleeding, cramping, infection, malpositioning or misplacement of the IUD outside the uterus which may require further procedure such as laparoscopy. Also discussed >99% contraception efficacy, increased risk of ectopic pregnancy with failure of method.   Emphasized that this did not protect against STIs, condoms recommended during all sexual encounters. Time out was performed.  Urine pregnancy test negative.  Speculum placed in the vagina.  Cervix visualized.  Cleaned with Betadine x 2.  Grasped anteriorly with a single tooth tenaculum.  Uterus sounded to 8.5 cm.  Liletta IUD placed per manufacturer's recommendations.  Strings trimmed to 3 cm. Tenaculum was removed, good hemostasis noted.  Patient tolerated procedure well.   Patient was given post-procedure instructions.  She was advised to have backup contraception for one week.  Patient was also asked to check IUD strings periodically and follow up in 4 weeks for IUD check.   Juanna Cao, MD, [place] for [facility], [place]

## 2019-10-23 LAB — CYTOLOGY - PAP
Chlamydia: NEGATIVE
Comment: NEGATIVE
Comment: NEGATIVE
Comment: NEGATIVE
Comment: NORMAL
Diagnosis: NEGATIVE
High risk HPV: NEGATIVE
Neisseria Gonorrhea: NEGATIVE
Trichomonas: NEGATIVE

## 2019-11-06 ENCOUNTER — Telehealth: Payer: Self-pay | Admitting: *Deleted

## 2019-11-06 DIAGNOSIS — N921 Excessive and frequent menstruation with irregular cycle: Secondary | ICD-10-CM

## 2019-11-06 NOTE — Telephone Encounter (Addendum)
Pt left VM message stating that she had [place] IUD inserted on 9/9. She states she was told she may have mild cramping and light bleeding for 24-48 hrs. Pt states she is still having bleeding and cramping and wants to know how long it will last.  9/30  1042  I called pt and discussed her concerns. She stated that she has been having period like cramps ever since the insertion of the IUD on 9/9. She was taking ibuprofen 600 mg however has run out of the medication and was not sure if she could use the OTC ibuprofen. I advised pt that she may use OTC ibuprofen - 3 tablets (600 mg) every 6 hrs as needed. Pt also stated that she has been bleeding every Rupal Childress since the IUD insertion. She uses 5-6 overnight pads/Kalsey Lull and they are soaked with dk red/brown blood @ each change. I advised pt that I will consult with a provider and call her back with recommendations. Per consult w/Dr. Elgie Congo, order was  received for pt to have ultrasound for IUD placement prior to next scheduled visit on 10/7. Pt was notified of plan of care recommendation. She voiced understanding and agreed to ultrasound appointment on 10/5 @ 1545. Pt was advised she will need a full bladder for the exam.

## 2019-11-14 ENCOUNTER — Ambulatory Visit
Admission: RE | Admit: 2019-11-14 | Discharge: 2019-11-14 | Disposition: A | Discharge status: Home / Self Care | Payer: Medicaid Other | Source: Ambulatory Visit | Attending: Obstetrics and Gynecology | Admitting: Obstetrics and Gynecology

## 2019-11-14 ENCOUNTER — Other Ambulatory Visit: Payer: Self-pay

## 2019-11-14 DIAGNOSIS — N921 Excessive and frequent menstruation with irregular cycle: Secondary | ICD-10-CM | POA: Insufficient documentation

## 2019-11-14 DIAGNOSIS — Z975 Presence of (intrauterine) contraceptive device: Secondary | ICD-10-CM | POA: Insufficient documentation

## 2019-11-16 ENCOUNTER — Encounter: Payer: Self-pay | Admitting: Obstetrics & Gynecology

## 2019-11-16 ENCOUNTER — Ambulatory Visit (INDEPENDENT_AMBULATORY_CARE_PROVIDER_SITE_OTHER): Payer: Medicaid Other | Admitting: Obstetrics & Gynecology

## 2019-11-16 ENCOUNTER — Other Ambulatory Visit: Payer: Self-pay

## 2019-11-16 VITALS — BP 125/87 | HR 106 | Wt 238.1 lb

## 2019-11-16 DIAGNOSIS — F410 Panic disorder [episodic paroxysmal anxiety] without agoraphobia: Secondary | ICD-10-CM

## 2019-11-16 DIAGNOSIS — Z30432 Encounter for removal of intrauterine contraceptive device: Secondary | ICD-10-CM

## 2019-11-16 DIAGNOSIS — F411 Generalized anxiety disorder: Secondary | ICD-10-CM

## 2019-11-16 MED ORDER — MEDROXYPROGESTERONE ACETATE 5 MG PO TABS: 5.0000 mg | ORAL_TABLET | Freq: Every day | ORAL | 1 refills | Status: DC

## 2019-11-16 NOTE — Progress Notes (Signed)
     GYNECOLOGY OFFICE ENCOUNTER NOTE  History:  44 y.o. [phone] here today for today for IUD string check; Liletta  IUD was placed  9/9.  Complaints about the IUD, has had cramping and irregular bleeding  concerning side effects. Ibuprofen has not helped, she has had daily cramping for the past few weeks. U/S noted IUD in lower uterine segment. Discussed replacement vs removal and progesterone to help with menorrhagia. PT desires IUD removal.   The following portions of the patient's history were reviewed and updated as appropriate: allergies, current medications, past family history, past medical history, past social history, past surgical history and problem list.   Review of Systems:  Pertinent items are noted in HPI.   Objective:  Physical Exam Blood pressure 125/87, pulse (!) 106, weight 238 lb 1.6 oz (108 kg). CONSTITUTIONAL: Well-developed, well-nourished female in no acute distress.  HENT:  Normocephalic, atraumatic. External right and left ear normal. Oropharynx is clear and moist EYES: Conjunctivae and EOM are normal. Pupils are equal, round, and reactive to light. No scleral icterus.  NECK: Normal range of motion, supple, no masses CARDIOVASCULAR: Normal heart rate noted RESPIRATORY: Effort and breath sounds normal, no problems with respiration noted ABDOMEN: Soft, no distention noted.   PELVIC: Normal appearing external genitalia; normal appearing vaginal mucosa and cervix.  IUD strings visualized, about 3 cm in length outside cervix.   IUD Removal  Patient identified, informed consent performed, consent signed.  Patient was in the dorsal lithotomy position, normal external genitalia was noted.  A speculum was placed in the patient's vagina, normal discharge was noted, no lesions. The cervix was visualized, no lesions, no abnormal discharge.  The strings of the IUD were grasped and pulled using ring forceps. The IUD was removed in its entirety.   Assessment & Plan:  Patient  tolerated IUD removal will start cyclic provera.  Follow up in 2-3 months, sooner prn concerns.    Juanna Cao, MD, [place] for [facility], [place]

## 2019-11-16 NOTE — Patient Instructions (Signed)
Menorrhagia Menorrhagia is when your menstrual periods are heavy or last longer than normal. Follow these instructions at home: Medicines   Take over-the-counter and prescription medicines exactly as told by your doctor. This includes iron pills.  Do not change or switch medicines without asking your doctor.  Do not take aspirin or medicines that contain aspirin 1 week before or during your period. Aspirin may make bleeding worse. General instructions  If you need to change your pad or tampon more than once every 2 hours, limit your activity until the bleeding stops.  Iron pills can cause problems when pooping (constipation). To prevent or treat pooping problems while taking prescription iron pills, your doctor may suggest that you: ? Drink enough fluid to keep your pee (urine) clear or pale yellow. ? Take over-the-counter or prescription medicines. ? Eat foods that are high in fiber. These foods include:  Fresh fruits and vegetables.  Whole grains.  Beans. ? Limit foods that are high in fat and processed sugars. This includes fried and sweet foods.  Eat healthy meals and foods that are high in iron. Foods that have a lot of iron include: ? Leafy green vegetables. ? Meat. ? Liver. ? Eggs. ? Whole grain breads and cereals.  Do not try to lose weight until your heavy bleeding has stopped and you have normal amounts of iron in your blood. If you need to lose weight, work with your doctor.  Keep all follow-up visits as told by your doctor. This is important. Contact a doctor if:  You soak through a pad or tampon every 1 or 2 hours, and this happens every time you have a period.  You need to use pads and tampons at the same time because you are bleeding so much.  You are taking medicine and you: ? Feel sick to your stomach (nauseous). ? Throw up (vomit). ? Have watery poop (diarrhea).  You have other problems that may be related to the medicine you are taking. Get help  right away if:  You soak through more than a pad or tampon in 1 hour.  You pass clots bigger than 1 inch (2.5 cm) wide.  You feel short of breath.  You feel like your heart is beating too fast.  You feel dizzy or you pass out (faint).  You feel very weak or tired. Summary  Menorrhagia is when your menstrual periods are heavy or last longer than normal.  Take over-the-counter and prescription medicines exactly as told by your doctor. This includes iron pills.  Contact a doctor if you soak through more than a pad or tampon in 1 hour or are passing large clots. This information is not intended to replace advice given to you by your health care provider. Make sure you discuss any questions you have with your health care provider. Document Revised: 05/05/2017 Document Reviewed: 02/17/2016 Elsevier Patient Education  2020 Elsevier Inc.  

## 2019-11-16 NOTE — Progress Notes (Signed)
Elevated PHQ-9 and GAD-7; no SI. Pt reports history of anxiety. Currently taking Atarax. Previously seen at [facility], not currently being followed. Offered [facility] services; pt agreeable. Appt to be scheduled today.   Apolonio Schneiders RN 11/16/19

## 2019-11-22 NOTE — BH Specialist Note (Deleted)
Integrated Behavioral Health via Telemedicine Video (Caregility) Visit  11/22/2019 Cassandra Greer [id number]  Number of [place] visits: 1 Session Start time: 9:15***  Session End time: 10:15*** Total time: {IBH Total Time:21014050} minutes  Referring Provider: Juanna Cao, MD Type of Service: Individual, Family, *** Patient/Family location: Home [facility] Provider location: [facility] for [employer] at [facility]  All persons participating in visit: Patient *** and Cassandra Greer ***    I connected with Cassandra Greer and/or Cassandra Greer's {family members:20773} by a video enabled telemedicine application ([place]) and verified that I am speaking with the correct person using two identifiers.   Discussed confidentiality: {YES/NO:21197}  Confirmed demographics & insurance:  {YES/NO:21197}  I discussed that engaging in this virtual visit, they consent to the provision of behavioral healthcare and the services will be billed under their insurance.   Patient and/or legal guardian expressed understanding and consented to virtual visit: {YES/NO:21197}  PRESENTING CONCERNS: Patient and/or family reports the following symptoms/concerns: *** Duration of problem: ***; Severity of problem: {Mild/Moderate/Severe:20260}  STRENGTHS (Protective Factors/Coping Skills): {CHL AMB BH PROTECTIVE FACTORS/[phone]}  ASSESSMENT: Patient currently experiencing ***.    GOALS ADDRESSED: Patient will: 1.  Reduce symptoms of: {IBH Symptoms:21014056}  2.  Increase knowledge and/or ability of: {IBH Patient Tools:21014057}  3.  Demonstrate ability to: {IBH Goals:21014053}   Progress of Goals: {CHL AMB BH PROGRESS TOWARDS CNOBS:9628366294}  INTERVENTIONS: Interventions utilized:  {IBH Interventions:21014054} Standardized Assessments completed & reviewed: {IBH Screening Tools:21014051}   OUTCOME: Patient Response:  ***   PLAN: 1. Follow up with behavioral health clinician on : *** 2. Behavioral recommendations: *** 3. Referral(s): {IBH Referrals:21014055}  I discussed the assessment and treatment plan with the patient and/or parent/guardian. They were provided an opportunity to ask questions and all were answered. They agreed with the plan and demonstrated an understanding of the instructions.   They were advised to call back or seek an in-person evaluation as appropriate.  I discussed that the purpose of this visit is to provide behavioral health care while limiting exposure to the novel coronavirus.  Discussed there is a possibility of technology failure and discussed alternative modes of communication if that failure occurs.  Cassandra Greer

## 2019-11-27 ENCOUNTER — Observation Stay (HOSPITAL_COMMUNITY)
Admission: EM | Admit: 2019-11-27 | Discharge: 2019-11-29 | Disposition: A | Discharge status: Home / Self Care | Payer: Self-pay | Attending: Surgery | Admitting: Surgery

## 2019-11-27 ENCOUNTER — Emergency Department (HOSPITAL_COMMUNITY): Payer: Medicaid Other

## 2019-11-27 ENCOUNTER — Encounter (HOSPITAL_COMMUNITY): Payer: Self-pay

## 2019-11-27 DIAGNOSIS — E119 Type 2 diabetes mellitus without complications: Secondary | ICD-10-CM | POA: Insufficient documentation

## 2019-11-27 DIAGNOSIS — K429 Umbilical hernia without obstruction or gangrene: Secondary | ICD-10-CM | POA: Insufficient documentation

## 2019-11-27 DIAGNOSIS — I10 Essential (primary) hypertension: Secondary | ICD-10-CM | POA: Insufficient documentation

## 2019-11-27 DIAGNOSIS — Z20822 Contact with and (suspected) exposure to covid-19: Secondary | ICD-10-CM | POA: Insufficient documentation

## 2019-11-27 DIAGNOSIS — K432 Incisional hernia without obstruction or gangrene: Principal | ICD-10-CM | POA: Diagnosis present

## 2019-11-27 DIAGNOSIS — Z79899 Other long term (current) drug therapy: Secondary | ICD-10-CM | POA: Insufficient documentation

## 2019-11-27 LAB — COMPREHENSIVE METABOLIC PANEL
ALT: 13 U/L (ref 0–44)
AST: 13 U/L — ABNORMAL LOW (ref 15–41)
Albumin: 3.8 g/dL (ref 3.5–5.0)
Alkaline Phosphatase: 81 U/L (ref 38–126)
Anion gap: 11 (ref 5–15)
BUN: 9 mg/dL (ref 6–20)
CO2: 24 mmol/L (ref 22–32)
Calcium: 9.6 mg/dL (ref 8.9–10.3)
Chloride: 104 mmol/L (ref 98–111)
Creatinine, Ser: 0.68 mg/dL (ref 0.44–1.00)
GFR, Estimated: >60 mL/min (ref >60)
Glucose, Bld: 159 mg/dL — ABNORMAL HIGH (ref 70–99)
Potassium: 3.5 mmol/L (ref 3.5–5.1)
Sodium: 139 mmol/L (ref 135–145)
Total Bilirubin: 0.5 mg/dL (ref 0.3–1.2)
Total Protein: 7.2 g/dL (ref 6.5–8.1)

## 2019-11-27 LAB — URINALYSIS, ROUTINE W REFLEX MICROSCOPIC
Bilirubin Urine: NEGATIVE
Glucose, UA: NEGATIVE mg/dL
Ketones, ur: NEGATIVE mg/dL
Nitrite: NEGATIVE
Protein, ur: 30 mg/dL — AB
Specific Gravity, Urine: 1.004 — ABNORMAL LOW (ref 1.005–1.030)
pH: 8 (ref 5.0–8.0)

## 2019-11-27 LAB — CBC WITH DIFFERENTIAL/PLATELET
Abs Immature Granulocytes: 0.06 10*3/uL (ref 0.00–0.07)
Basophils Absolute: 0.1 10*3/uL (ref 0.0–0.1)
Basophils Relative: 1 %
Eosinophils Absolute: 0.4 10*3/uL (ref 0.0–0.5)
Eosinophils Relative: 3 %
HCT: 38.7 % (ref 36.0–46.0)
Hemoglobin: 11.6 g/dL — ABNORMAL LOW (ref 12.0–15.0)
Immature Granulocytes: 1 %
Lymphocytes Relative: 17 %
Lymphs Abs: 2.1 10*3/uL (ref 0.7–4.0)
MCH: 25.8 pg — ABNORMAL LOW (ref 26.0–34.0)
MCHC: 30 g/dL (ref 30.0–36.0)
MCV: 86 fL (ref 80.0–100.0)
Monocytes Absolute: 0.6 10*3/uL (ref 0.1–1.0)
Monocytes Relative: 5 %
Neutro Abs: 9.3 10*3/uL — ABNORMAL HIGH (ref 1.7–7.7)
Neutrophils Relative %: 73 %
Platelets: 457 10*3/uL — ABNORMAL HIGH (ref 150–400)
RBC: 4.5 MIL/uL (ref 3.87–5.11)
RDW: 14.6 % (ref 11.5–15.5)
WBC: 12.5 10*3/uL — ABNORMAL HIGH (ref 4.0–10.5)
nRBC: 0 % (ref 0.0–0.2)

## 2019-11-27 LAB — LIPASE, BLOOD: Lipase: 31 U/L (ref 11–51)

## 2019-11-27 LAB — I-STAT BETA HCG BLOOD, ED (MC, WL, AP ONLY): I-stat hCG, quantitative: <5 m[IU]/mL (ref <5)

## 2019-11-27 LAB — LACTIC ACID, PLASMA: Lactic Acid, Venous: 1.3 mmol/L (ref 0.5–1.9)

## 2019-11-27 MED ORDER — IOHEXOL 300 MG/ML  SOLN
100.0000 mL | Freq: Once | INTRAMUSCULAR | Status: AC | PRN
  Administered 2019-11-27: 100 mL via INTRAVENOUS

## 2019-11-27 NOTE — ED Triage Notes (Signed)
Pt reports abd hernia, needs surgical intervention but unable to due to financial issues at this time. Pt reports increased pain since Thursday and unable to have a BM since then. Urinating normal. Pt a.o

## 2019-11-27 NOTE — ED Provider Notes (Signed)
9:47 AM Nursing asked me to come see patient in triage to help determine what imaging might be helpful to have ordered due to likely prolonged wait in the emergency department.  Patient reports she has had a known midline abdominal hernia after having cholecystectomy in the past.  Patient was seen several months ago for significant pain and was going to have surgery but due to financial reasons did not follow-up on this.  She says that for the last 5 days, she has had significant 10 out of 10 abdominal pain and has not had any bowel movement or passed any gas.  She is concerned about obstruction.  She says that she thinks the hernia may have slightly reduced compared to what it was 5 days ago however is still having 10 out of 10 pain.  I went assessed the patient and she is having abdominal tenderness.  I could not reduce any further hernia on my exam.  Due to her 10 out of 10 pain, known hernia without any improvement in symptoms, patient will have screening labs as well as a CT abdomen pelvis with contrast.  She will still need to be seen by provider to continue her work-up but we will get a CT ordered as well as labs and a screening Covid test to help facilitate if she has to have surgery today.   Altan Kraai, Gwenyth Allegra, MD 11/27/19 [phone]

## 2019-11-28 ENCOUNTER — Observation Stay (HOSPITAL_COMMUNITY): Payer: Medicaid Other | Admitting: Certified Registered Nurse Anesthetist

## 2019-11-28 ENCOUNTER — Encounter (HOSPITAL_COMMUNITY)
Admission: EM | Disposition: A | Discharge status: Home / Self Care | Payer: Self-pay | Source: Home / Self Care | Attending: Emergency Medicine

## 2019-11-28 ENCOUNTER — Encounter (HOSPITAL_COMMUNITY): Payer: Self-pay

## 2019-11-28 DIAGNOSIS — K432 Incisional hernia without obstruction or gangrene: Secondary | ICD-10-CM | POA: Diagnosis present

## 2019-11-28 HISTORY — PX: VENTRAL HERNIA REPAIR: SHX424

## 2019-11-28 HISTORY — PX: INSERTION OF MESH: SHX5868

## 2019-11-28 LAB — RESPIRATORY PANEL BY RT PCR (FLU A&B, COVID)
Influenza A by PCR: NEGATIVE
Influenza B by PCR: NEGATIVE
SARS Coronavirus 2 by RT PCR: NEGATIVE

## 2019-11-28 LAB — CBC
HCT: 33 % — ABNORMAL LOW (ref 36.0–46.0)
Hemoglobin: 10 g/dL — ABNORMAL LOW (ref 12.0–15.0)
MCH: 26.2 pg (ref 26.0–34.0)
MCHC: 30.3 g/dL (ref 30.0–36.0)
MCV: 86.4 fL (ref 80.0–100.0)
Platelets: 391 10*3/uL (ref 150–400)
RBC: 3.82 MIL/uL — ABNORMAL LOW (ref 3.87–5.11)
RDW: 14.8 % (ref 11.5–15.5)
WBC: 11.2 10*3/uL — ABNORMAL HIGH (ref 4.0–10.5)
nRBC: 0 % (ref 0.0–0.2)

## 2019-11-28 LAB — GLUCOSE, CAPILLARY
Glucose-Capillary: 132 mg/dL — ABNORMAL HIGH (ref 70–99)
Glucose-Capillary: 240 mg/dL — ABNORMAL HIGH (ref 70–99)

## 2019-11-28 LAB — CREATININE, SERUM
Creatinine, Ser: 0.61 mg/dL (ref 0.44–1.00)
GFR, Estimated: >60 mL/min (ref >60)

## 2019-11-28 SURGERY — REPAIR, HERNIA, VENTRAL
Anesthesia: General | Site: Abdomen

## 2019-11-28 MED ORDER — DOCUSATE SODIUM 100 MG PO CAPS
100.0000 mg | ORAL_CAPSULE | Freq: Two times a day (BID) | ORAL | Status: DC
  Administered 2019-11-28 – 2019-11-29 (×2): 100 mg via ORAL
  Filled 2019-11-28 (×2): qty 1

## 2019-11-28 MED ORDER — ENOXAPARIN SODIUM 40 MG/0.4ML ~~LOC~~ SOLN
40.0000 mg | SUBCUTANEOUS | Status: DC
  Administered 2019-11-29: 40 mg via SUBCUTANEOUS
  Filled 2019-11-28: qty 0.4

## 2019-11-28 MED ORDER — GABAPENTIN 300 MG PO CAPS
ORAL_CAPSULE | ORAL | Status: AC
  Administered 2019-11-28: 300 mg via ORAL
  Filled 2019-11-28: qty 1

## 2019-11-28 MED ORDER — CEFAZOLIN SODIUM-DEXTROSE 2-4 GM/100ML-% IV SOLN
2.0000 g | INTRAVENOUS | Status: AC
  Administered 2019-11-28: 2 g via INTRAVENOUS

## 2019-11-28 MED ORDER — PROPOFOL 10 MG/ML IV BOLUS
INTRAVENOUS | Status: AC
  Filled 2019-11-28: qty 20

## 2019-11-28 MED ORDER — ROCURONIUM BROMIDE 10 MG/ML (PF) SYRINGE
PREFILLED_SYRINGE | INTRAVENOUS | Status: DC | PRN
  Administered 2019-11-28: 60 mg via INTRAVENOUS

## 2019-11-28 MED ORDER — ONDANSETRON 4 MG PO TBDP: 4.0000 mg | ORAL_TABLET | Freq: Four times a day (QID) | ORAL | Status: DC | PRN

## 2019-11-28 MED ORDER — ROCURONIUM BROMIDE 10 MG/ML (PF) SYRINGE
PREFILLED_SYRINGE | INTRAVENOUS | Status: AC
  Filled 2019-11-28: qty 10

## 2019-11-28 MED ORDER — FENTANYL CITRATE (PF) 250 MCG/5ML IJ SOLN
INTRAMUSCULAR | Status: AC
  Filled 2019-11-28: qty 5

## 2019-11-28 MED ORDER — SUCCINYLCHOLINE CHLORIDE 200 MG/10ML IV SOSY
PREFILLED_SYRINGE | INTRAVENOUS | Status: AC
  Filled 2019-11-28: qty 10

## 2019-11-28 MED ORDER — DIPHENHYDRAMINE HCL 25 MG PO CAPS: 25.0000 mg | ORAL_CAPSULE | Freq: Four times a day (QID) | ORAL | Status: DC | PRN

## 2019-11-28 MED ORDER — BUPIVACAINE HCL (PF) 0.25 % IJ SOLN
INTRAMUSCULAR | Status: DC | PRN
  Administered 2019-11-28: 20 mL

## 2019-11-28 MED ORDER — CHLORHEXIDINE GLUCONATE CLOTH 2 % EX PADS: 6.0000 | MEDICATED_PAD | Freq: Once | CUTANEOUS | Status: DC

## 2019-11-28 MED ORDER — MIDAZOLAM HCL 5 MG/5ML IJ SOLN
INTRAMUSCULAR | Status: DC | PRN
  Administered 2019-11-28: 2 mg via INTRAVENOUS

## 2019-11-28 MED ORDER — ATORVASTATIN CALCIUM 10 MG PO TABS
20.0000 mg | ORAL_TABLET | Freq: Every day | ORAL | Status: DC
  Administered 2019-11-28 – 2019-11-29 (×2): 20 mg via ORAL
  Filled 2019-11-28 (×2): qty 2

## 2019-11-28 MED ORDER — ONDANSETRON HCL 4 MG/2ML IJ SOLN: 4.0000 mg | Freq: Four times a day (QID) | INTRAMUSCULAR | Status: DC | PRN

## 2019-11-28 MED ORDER — DEXAMETHASONE SODIUM PHOSPHATE 10 MG/ML IJ SOLN
INTRAMUSCULAR | Status: AC
  Filled 2019-11-28: qty 1

## 2019-11-28 MED ORDER — DEXAMETHASONE SODIUM PHOSPHATE 10 MG/ML IJ SOLN
INTRAMUSCULAR | Status: DC | PRN
  Administered 2019-11-28: 4 mg via INTRAVENOUS

## 2019-11-28 MED ORDER — PROPOFOL 10 MG/ML IV BOLUS
INTRAVENOUS | Status: DC | PRN
  Administered 2019-11-28: 160 mg via INTRAVENOUS

## 2019-11-28 MED ORDER — ONDANSETRON HCL 4 MG/2ML IJ SOLN
INTRAMUSCULAR | Status: AC
  Filled 2019-11-28: qty 2

## 2019-11-28 MED ORDER — HYDROMORPHONE HCL 1 MG/ML IJ SOLN
1.0000 mg | Freq: Once | INTRAMUSCULAR | Status: AC
  Administered 2019-11-28: 1 mg via INTRAVENOUS
  Filled 2019-11-28: qty 1

## 2019-11-28 MED ORDER — QUETIAPINE FUMARATE 100 MG PO TABS
100.0000 mg | ORAL_TABLET | Freq: Every day | ORAL | Status: DC
  Administered 2019-11-28: 100 mg via ORAL
  Filled 2019-11-28 (×3): qty 1

## 2019-11-28 MED ORDER — CHLORHEXIDINE GLUCONATE 0.12 % MT SOLN
OROMUCOSAL | Status: AC
  Administered 2019-11-28: 15 mL via OROMUCOSAL
  Filled 2019-11-28: qty 15

## 2019-11-28 MED ORDER — MIDAZOLAM HCL 2 MG/2ML IJ SOLN
INTRAMUSCULAR | Status: AC
  Filled 2019-11-28: qty 2

## 2019-11-28 MED ORDER — HYDROMORPHONE HCL 1 MG/ML IJ SOLN
INTRAMUSCULAR | Status: AC
  Filled 2019-11-28: qty 1

## 2019-11-28 MED ORDER — ONDANSETRON HCL 4 MG/2ML IJ SOLN
4.0000 mg | Freq: Once | INTRAMUSCULAR | Status: AC
  Administered 2019-11-28: 4 mg via INTRAVENOUS
  Filled 2019-11-28: qty 2

## 2019-11-28 MED ORDER — OXYCODONE HCL 5 MG PO TABS
5.0000 mg | ORAL_TABLET | ORAL | Status: DC | PRN
  Administered 2019-11-28 – 2019-11-29 (×5): 10 mg via ORAL
  Filled 2019-11-28 (×5): qty 2

## 2019-11-28 MED ORDER — KCL IN DEXTROSE-NACL 20-5-0.45 MEQ/L-%-% IV SOLN
INTRAVENOUS | Status: DC
  Filled 2019-11-28: qty 1000

## 2019-11-28 MED ORDER — OXYCODONE-ACETAMINOPHEN 5-325 MG PO TABS: 1.0000 | ORAL_TABLET | ORAL | Status: DC | PRN

## 2019-11-28 MED ORDER — CEFAZOLIN SODIUM-DEXTROSE 2-4 GM/100ML-% IV SOLN
INTRAVENOUS | Status: AC
  Filled 2019-11-28: qty 100

## 2019-11-28 MED ORDER — LISINOPRIL 20 MG PO TABS
40.0000 mg | ORAL_TABLET | Freq: Every day | ORAL | Status: DC
  Administered 2019-11-28 – 2019-11-29 (×2): 40 mg via ORAL
  Filled 2019-11-28 (×2): qty 2

## 2019-11-28 MED ORDER — OXYCODONE HCL 5 MG PO TABS: 5.0000 mg | ORAL_TABLET | Freq: Once | ORAL | Status: DC | PRN

## 2019-11-28 MED ORDER — DIPHENHYDRAMINE HCL 50 MG/ML IJ SOLN
25.0000 mg | Freq: Four times a day (QID) | INTRAMUSCULAR | Status: DC | PRN
  Administered 2019-11-28: 25 mg via INTRAVENOUS
  Filled 2019-11-28: qty 1

## 2019-11-28 MED ORDER — FAMOTIDINE 20 MG PO TABS
20.0000 mg | ORAL_TABLET | Freq: Two times a day (BID) | ORAL | Status: DC
  Administered 2019-11-28 – 2019-11-29 (×3): 20 mg via ORAL
  Filled 2019-11-28 (×3): qty 1

## 2019-11-28 MED ORDER — HYDROXYZINE HCL 25 MG PO TABS
25.0000 mg | ORAL_TABLET | Freq: Three times a day (TID) | ORAL | Status: DC | PRN
  Administered 2019-11-28: 25 mg via ORAL
  Filled 2019-11-28: qty 1

## 2019-11-28 MED ORDER — CHLORHEXIDINE GLUCONATE 0.12 % MT SOLN: 15.0000 mL | Freq: Once | OROMUCOSAL | Status: AC

## 2019-11-28 MED ORDER — HYDROCHLOROTHIAZIDE 10 MG/ML ORAL SUSPENSION
6.2500 mg | Freq: Every day | ORAL | Status: DC
  Administered 2019-11-28: 6.25 mg via ORAL
  Filled 2019-11-28 (×2): qty 1.25

## 2019-11-28 MED ORDER — LIDOCAINE 2% (20 MG/ML) 5 ML SYRINGE
INTRAMUSCULAR | Status: AC
  Filled 2019-11-28: qty 5

## 2019-11-28 MED ORDER — KETOROLAC TROMETHAMINE 30 MG/ML IJ SOLN
30.0000 mg | Freq: Four times a day (QID) | INTRAMUSCULAR | Status: DC | PRN
  Filled 2019-11-28: qty 1

## 2019-11-28 MED ORDER — PROMETHAZINE HCL 25 MG/ML IJ SOLN
25.0000 mg | Freq: Once | INTRAMUSCULAR | Status: AC
  Administered 2019-11-28: 25 mg via INTRAVENOUS
  Filled 2019-11-28: qty 1

## 2019-11-28 MED ORDER — CEFAZOLIN SODIUM-DEXTROSE 2-4 GM/100ML-% IV SOLN
2.0000 g | Freq: Once | INTRAVENOUS | Status: AC
  Administered 2019-11-28: 2 g via INTRAVENOUS
  Filled 2019-11-28: qty 100

## 2019-11-28 MED ORDER — DIPHENHYDRAMINE HCL 25 MG PO CAPS
25.0000 mg | ORAL_CAPSULE | Freq: Once | ORAL | Status: AC
  Administered 2019-11-28: 25 mg via ORAL
  Filled 2019-11-28: qty 1

## 2019-11-28 MED ORDER — ONDANSETRON HCL 4 MG/2ML IJ SOLN
4.0000 mg | Freq: Once | INTRAMUSCULAR | Status: AC | PRN
  Administered 2019-11-28: 4 mg via INTRAVENOUS

## 2019-11-28 MED ORDER — LIDOCAINE 2% (20 MG/ML) 5 ML SYRINGE
INTRAMUSCULAR | Status: DC | PRN
  Administered 2019-11-28: 60 mg via INTRAVENOUS
  Administered 2019-11-28: 40 mg via INTRAVENOUS

## 2019-11-28 MED ORDER — AMLODIPINE BESYLATE 5 MG PO TABS
10.0000 mg | ORAL_TABLET | Freq: Every day | ORAL | Status: DC
  Administered 2019-11-28 – 2019-11-29 (×2): 10 mg via ORAL
  Filled 2019-11-28 (×2): qty 2

## 2019-11-28 MED ORDER — SODIUM CHLORIDE 0.9 % IV BOLUS
1000.0000 mL | Freq: Once | INTRAVENOUS | Status: AC
  Administered 2019-11-28: 1000 mL via INTRAVENOUS

## 2019-11-28 MED ORDER — POLYETHYLENE GLYCOL 3350 17 G PO PACK
17.0000 g | PACK | Freq: Every day | ORAL | Status: DC
  Administered 2019-11-29: 17 g via ORAL
  Filled 2019-11-28: qty 1

## 2019-11-28 MED ORDER — MORPHINE SULFATE (PF) 2 MG/ML IV SOLN: 2.0000 mg | INTRAVENOUS | Status: DC | PRN

## 2019-11-28 MED ORDER — 0.9 % SODIUM CHLORIDE (POUR BTL) OPTIME
TOPICAL | Status: DC | PRN
  Administered 2019-11-28: 1000 mL

## 2019-11-28 MED ORDER — METOPROLOL TARTRATE 5 MG/5ML IV SOLN
5.0000 mg | Freq: Four times a day (QID) | INTRAVENOUS | Status: DC | PRN
  Filled 2019-11-28: qty 5

## 2019-11-28 MED ORDER — ORAL CARE MOUTH RINSE: 15.0000 mL | Freq: Once | OROMUCOSAL | Status: AC

## 2019-11-28 MED ORDER — FENTANYL CITRATE (PF) 100 MCG/2ML IJ SOLN
100.0000 ug | INTRAMUSCULAR | Status: DC | PRN
  Administered 2019-11-28: 100 ug via INTRAVENOUS
  Filled 2019-11-28: qty 2

## 2019-11-28 MED ORDER — FENTANYL CITRATE (PF) 100 MCG/2ML IJ SOLN
100.0000 ug | Freq: Once | INTRAMUSCULAR | Status: AC
  Administered 2019-11-28: 100 ug via INTRAVENOUS
  Filled 2019-11-28: qty 2

## 2019-11-28 MED ORDER — BUPIVACAINE HCL (PF) 0.25 % IJ SOLN
INTRAMUSCULAR | Status: AC
  Filled 2019-11-28: qty 30

## 2019-11-28 MED ORDER — ACETAMINOPHEN 500 MG PO TABS
1000.0000 mg | ORAL_TABLET | Freq: Four times a day (QID) | ORAL | Status: DC
  Administered 2019-11-28 – 2019-11-29 (×5): 1000 mg via ORAL
  Filled 2019-11-28 (×4): qty 2

## 2019-11-28 MED ORDER — ONDANSETRON HCL 4 MG/2ML IJ SOLN
INTRAMUSCULAR | Status: DC | PRN
  Administered 2019-11-28: 4 mg via INTRAVENOUS

## 2019-11-28 MED ORDER — GABAPENTIN 300 MG PO CAPS: 300.0000 mg | ORAL_CAPSULE | ORAL | Status: AC

## 2019-11-28 MED ORDER — FENTANYL CITRATE (PF) 250 MCG/5ML IJ SOLN
INTRAMUSCULAR | Status: DC | PRN
  Administered 2019-11-28 (×2): 50 ug via INTRAVENOUS
  Administered 2019-11-28: 75 ug via INTRAVENOUS
  Administered 2019-11-28: 25 ug via INTRAVENOUS

## 2019-11-28 MED ORDER — LACTATED RINGERS IV SOLN: INTRAVENOUS | Status: DC

## 2019-11-28 MED ORDER — OXYCODONE HCL 5 MG/5ML PO SOLN: 5.0000 mg | Freq: Once | ORAL | Status: DC | PRN

## 2019-11-28 MED ORDER — HYDROMORPHONE HCL 1 MG/ML IJ SOLN
0.2500 mg | INTRAMUSCULAR | Status: DC | PRN
  Administered 2019-11-28 (×2): 0.5 mg via INTRAVENOUS

## 2019-11-28 MED ORDER — SUGAMMADEX SODIUM 200 MG/2ML IV SOLN
INTRAVENOUS | Status: DC | PRN
  Administered 2019-11-28: 200 mg via INTRAVENOUS

## 2019-11-28 MED ORDER — MORPHINE SULFATE (PF) 4 MG/ML IV SOLN
4.0000 mg | Freq: Once | INTRAVENOUS | Status: AC
  Administered 2019-11-28: 4 mg via INTRAVENOUS
  Filled 2019-11-28: qty 1

## 2019-11-28 SURGICAL SUPPLY — 41 items
BINDER ABDOMINAL 12 ML 46-62 (SOFTGOODS) ×2 IMPLANT
BLADE CLIPPER SURG (BLADE) IMPLANT
CANISTER SUCT 3000ML PPV (MISCELLANEOUS) ×2 IMPLANT
CHLORAPREP W/TINT 26 (MISCELLANEOUS) ×2 IMPLANT
COVER SURGICAL LIGHT HANDLE (MISCELLANEOUS) ×2 IMPLANT
COVER WAND RF STERILE (DRAPES) ×2 IMPLANT
DERMABOND ADVANCED (GAUZE/BANDAGES/DRESSINGS) ×1
DERMABOND ADVANCED .7 DNX12 (GAUZE/BANDAGES/DRESSINGS) ×1 IMPLANT
DRAPE LAPAROSCOPIC ABDOMINAL (DRAPES) ×2 IMPLANT
ELECT REM PT RETURN 9FT ADLT (ELECTROSURGICAL) ×2
ELECTRODE REM PT RTRN 9FT ADLT (ELECTROSURGICAL) ×1 IMPLANT
GAUZE 4X4 16PLY RFD (DISPOSABLE) ×2 IMPLANT
GLOVE BIO SURGEON STRL SZ8 (GLOVE) ×2 IMPLANT
GLOVE BIOGEL PI IND STRL 8 (GLOVE) ×1 IMPLANT
GLOVE BIOGEL PI INDICATOR 8 (GLOVE) ×1
GOWN STRL REUS W/ TWL LRG LVL3 (GOWN DISPOSABLE) ×3 IMPLANT
GOWN STRL REUS W/ TWL XL LVL3 (GOWN DISPOSABLE) ×1 IMPLANT
GOWN STRL REUS W/TWL LRG LVL3 (GOWN DISPOSABLE) ×6
GOWN STRL REUS W/TWL XL LVL3 (GOWN DISPOSABLE) ×2
KIT BASIN OR (CUSTOM PROCEDURE TRAY) ×2 IMPLANT
KIT TURNOVER KIT B (KITS) ×2 IMPLANT
MESH VENTRALEX ST 1-7/10 CRC S (Mesh General) ×2 IMPLANT
NEEDLE HYPO 25GX1X1/2 BEV (NEEDLE) ×2 IMPLANT
NS IRRIG 1000ML POUR BTL (IV SOLUTION) ×2 IMPLANT
PACK GENERAL/GYN (CUSTOM PROCEDURE TRAY) ×2 IMPLANT
PAD ARMBOARD 7.5X6 YLW CONV (MISCELLANEOUS) ×2 IMPLANT
PENCIL SMOKE EVACUATOR (MISCELLANEOUS) ×2 IMPLANT
STAPLER VISISTAT 35W (STAPLE) IMPLANT
SUT MNCRL AB 4-0 PS2 18 (SUTURE) ×2 IMPLANT
SUT NOVA 1 T20/GS 25DT (SUTURE) ×2 IMPLANT
SUT PDS AB 1 TP1 96 (SUTURE) IMPLANT
SUT PROLENE 1 CT (SUTURE) IMPLANT
SUT VIC AB 3-0 54X BRD REEL (SUTURE) IMPLANT
SUT VIC AB 3-0 BRD 54 (SUTURE)
SUT VIC AB 3-0 SH 27 (SUTURE) ×2
SUT VIC AB 3-0 SH 27XBRD (SUTURE) ×1 IMPLANT
SYR BULB IRRIG 60ML STRL (SYRINGE) ×2 IMPLANT
SYR CONTROL 10ML LL (SYRINGE) ×2 IMPLANT
TOWEL GREEN STERILE (TOWEL DISPOSABLE) ×2 IMPLANT
TOWEL GREEN STERILE FF (TOWEL DISPOSABLE) ×2 IMPLANT
TRAY FOLEY MTR SLVR 14FR STAT (SET/KITS/TRAYS/PACK) IMPLANT

## 2019-11-28 NOTE — Discharge Instructions (Signed)
[facility] _______[facility] ° °UMBILICAL OR INGUINAL HERNIA REPAIR: POST OP INSTRUCTIONS ° °Always review your discharge instruction sheet given to you by the facility where your surgery was performed. °IF YOU HAVE DISABILITY OR FAMILY LEAVE FORMS, YOU MUST BRING THEM TO THE OFFICE FOR PROCESSING.   °DO NOT GIVE THEM TO YOUR DOCTOR. ° °1. A  prescription for pain medication may be given to you upon discharge.  Take your pain medication as prescribed, if needed.  If narcotic pain medicine is not needed, then you may take acetaminophen (Tylenol) or ibuprofen (Advil) as needed. °2. Take your usually prescribed medications unless otherwise directed. °If you need a refill on your pain medication, please contact your pharmacy.  They will contact our office to request authorization. Prescriptions will not be filled after 5 pm or on week-ends. °3. You should follow a light diet the first 24 hours after arrival home, such as soup and crackers, etc.  Be sure to include lots of fluids daily.  Resume your normal diet the day after surgery. °4.Most patients will experience some swelling and bruising around the umbilicus or in the groin and scrotum.  Ice packs and reclining will help.  Swelling and bruising can take several days to resolve.  °6. It is common to experience some constipation if taking pain medication after surgery.  Increasing fluid intake and taking a stool softener (such as Colace) will usually help or prevent this problem from occurring.  A mild laxative (Milk of Magnesia or Miralax) should be taken according to package directions if there are no bowel movements after 48 hours. °7. Unless discharge instructions indicate otherwise, you may remove your bandages 24-48 hours after surgery, and you may shower at that time.  You may have steri-strips (small skin tapes) in place directly over the incision.  These strips should be left on the skin for 7-10 days.  If your surgeon used skin glue on the  incision, you may shower in 24 hours.  The glue will flake off over the next 2-3 weeks.  Any sutures or staples will be removed at the office during your follow-up visit. °8. ACTIVITIES:  You may resume regular (light) daily activities beginning the next day--such as daily self-care, walking, climbing stairs--gradually increasing activities as tolerated.  You may have sexual intercourse when it is comfortable.  Refrain from any heavy lifting or straining until approved by your doctor. ° °a.You may drive when you are no longer taking prescription pain medication, you can comfortably wear a seatbelt, and you can safely maneuver your car and apply brakes. ° °9.You should see your doctor in the office for a follow-up appointment approximately 2-3 weeks after your surgery.  Make sure that you call for this appointment within a day or two after you arrive home to insure a convenient appointment time. ° ° °WHEN TO CALL YOUR DOCTOR: °1. Fever over 101.0 °2. Inability to urinate °3. Nausea and/or vomiting °4. Extreme swelling or bruising °5. Continued bleeding from incision. °6. Increased pain, redness, or drainage from the incision ° °The clinic staff is available to answer your questions during regular business hours.  Please don’t hesitate to call and ask to speak to one of the nurses for clinical concerns.  If you have a medical emergency, go to the nearest emergency room or call 911.  A surgeon from [facility] is always on call at the hospital ° ° °[address], [address], [place], [place]  [address] ? °   [id number], [place], [place]   [address] [phone] ? [phone] ? FAX [phone] Web site: [email or web address]     Managing Your Pain After Surgery Without Opioids    Thank you for participating in our program to help patients manage their pain after surgery without opioids. This is part of our effort to provide you with the best care possible, without  exposing you or your family to the risk that opioids pose.  What pain can I expect after surgery? You can expect to have some pain after surgery. This is normal. The pain is typically worse the day after surgery, and quickly begins to get better. Many studies have found that many patients are able to manage their pain after surgery with Over-the-Counter (OTC) medications such as Tylenol and Motrin. If you have a condition that does not allow you to take Tylenol or Motrin, notify your surgical team.  How will I manage my pain? The best strategy for controlling your pain after surgery is around the clock pain control with Tylenol (acetaminophen) and Motrin (ibuprofen or Advil). Alternating these medications with each other allows you to maximize your pain control. In addition to Tylenol and Motrin, you can use heating pads or ice packs on your incisions to help reduce your pain.  How will I alternate your regular strength over-the-counter pain medication? You will take a dose of pain medication every three hours. ; Start by taking 650 mg of Tylenol (2 pills of 325 mg) ; 3 hours later take 600 mg of Motrin (3 pills of 200 mg) ; 3 hours after taking the Motrin take 650 mg of Tylenol ; 3 hours after that take 600 mg of Motrin.   - 1 -  See example - if your first dose of Tylenol is at 12:00 PM   12:00 PM Tylenol 650 mg (2 pills of 325 mg)  3:00 PM Motrin 600 mg (3 pills of 200 mg)  6:00 PM Tylenol 650 mg (2 pills of 325 mg)  9:00 PM Motrin 600 mg (3 pills of 200 mg)  Continue alternating every 3 hours   We recommend that you follow this schedule around-the-clock for at least 3 days after surgery, or until you feel that it is no longer needed. Use the table on the last page of this handout to keep track of the medications you are taking. Important: Do not take more than 3000mg  of Tylenol or 3200mg  of Motrin in a 24-hour period. Do not take ibuprofen/Motrin if you have a history of bleeding  stomach ulcers, severe kidney disease, &/or actively taking a blood thinner  What if I still have pain? If you have pain that is not controlled with the over-the-counter pain medications (Tylenol and Motrin or Advil) you might have what we call breakthrough pain. You will receive a prescription for a small amount of an opioid pain medication such as Oxycodone, Tramadol, or Tylenol with Codeine. Use these opioid pills in the first 24 hours after surgery if you have breakthrough pain. Do not take more than 1 pill every 4-6 hours.  If you still have uncontrolled pain after using all opioid pills, don't hesitate to call our staff using the number provided. We will help make sure you are managing your pain in the best way possible, and if necessary, we can provide a prescription for additional pain medication.   Day 1    Time  Name of Medication Number of pills taken  Amount of Acetaminophen  Pain Level  Comments  AM PM       AM PM       AM PM       AM PM       AM PM       AM PM       AM PM       AM PM       Total Daily amount of Acetaminophen Do not take more than  3,000 mg per day      Day 2    Time  Name of Medication Number of pills taken  Amount of Acetaminophen  Pain Level   Comments  AM PM       AM PM       AM PM       AM PM       AM PM       AM PM       AM PM       AM PM       Total Daily amount of Acetaminophen Do not take more than  3,000 mg per day      Day 3    Time  Name of Medication Number of pills taken  Amount of Acetaminophen  Pain Level   Comments  AM PM       AM PM       AM PM       AM PM          AM PM       AM PM       AM PM       AM PM       Total Daily amount of Acetaminophen Do not take more than  3,000 mg per day      Day 4    Time  Name of Medication Number of pills taken  Amount of Acetaminophen  Pain Level   Comments  AM PM       AM PM       AM PM       AM PM       AM PM       AM PM       AM PM       AM  PM       Total Daily amount of Acetaminophen Do not take more than  3,000 mg per day      Day 5    Time  Name of Medication Number of pills taken  Amount of Acetaminophen  Pain Level   Comments  AM PM       AM PM       AM PM       AM PM       AM PM       AM PM       AM PM       AM PM       Total Daily amount of Acetaminophen Do not take more than  3,000 mg per day       Day 6    Time  Name of Medication Number of pills taken  Amount of Acetaminophen  Pain Level  Comments  AM PM       AM PM       AM PM       AM PM       AM PM       AM PM       AM  PM       AM PM       Total Daily amount of Acetaminophen Do not take more than  3,000 mg per day      Day 7    Time  Name of Medication Number of pills taken  Amount of Acetaminophen  Pain Level   Comments  AM PM       AM PM       AM PM       AM PM       AM PM       AM PM       AM PM       AM PM       Total Daily amount of Acetaminophen Do not take more than  3,000 mg per day        For additional information about how and where to safely dispose of unused opioid medications - [employer]  Disclaimer: This document contains information and/or instructional materials adapted from [place] for the typical patient with your condition. It does not replace medical advice from your health care provider because your experience may differ from that of the typical patient. Talk to your health care provider if you have any questions about this document, your condition or your treatment plan. Adapted from [place]

## 2019-11-28 NOTE — Op Note (Signed)
Preoperative diagnosis: Incarcerated 3 cm ventral hernia  Postoperative diagnosis: Same  Procedure: Repair of ventral hernia with 4.3 cm circular ventral light coated mesh  Surgeon: Erroll Luna MD   Assistant: Dr Rob Hickman MD   Anesthesia General with 0.25% Marcaine plain  Drains: None  Specimen: None  IV fluids: Per anesthesia record  Indications for procedure: The patient is a 44 year old female who presents for incarcerated ventral hernia repair.  She was seen in our office back in August and fortunately she is developed more pain what appears to be omentum incarcerated in her hernia which is just above the umbilicus.  This is partially reducible but not fully reducible.  She desired repair for pain.The risk of hernia repair include bleeding,  Infection,   Recurrence of the hernia,  Mesh use, chronic pain,  Organ injury,  Bowel injury,  Bladder injury,   nerve injury with numbness around the incision,  Death,  and worsening of preexisting  medical problems.  The alternatives to surgery have been discussed as well..  Long term expectations of both operative and non operative treatments have been discussed.   The patient agrees to proceed.  Description of procedure: Patient was met in holding area and questions were answered.  She was taken back to the operating.  She is placed supine upon the OR table.  After induction of general esthesia, the periumbilical region was prepped and draped in sterile fashion timeout performed.  A 4 cm incision was made just above the umbilicus.  Dissection was carried down through a thick layer of fat into the fascia was identified and the hernia defect was noted which measured approximately 3 cm in maximal diameter.  I dissected the fat out of the hernia and reduced it.  I entered the preperitoneal space but not the peritoneal cavity.  I swept underneath for about 2 cm circumferentially around the fascia.  A 4.3 cm circular ventral light mesh was used.  This  placed in an underlay position.  We then secured to the fascial edge with interrupted #1 Novafil sutures.  The fascia was closed over with #1 Novafil.  Hemostasis achieved.  The subcutaneous tissue closed with 3-0 Vicryl.  4 Monocryl was used to close skin in a subcuticular fashion.  Dermabond applied.  All counts were found to be correct.  Abdominal binder placed.  The patient was awoke extubated taken to recovery in satisfactory condition.

## 2019-11-28 NOTE — BH Specialist Note (Deleted)
Integrated Behavioral Health via Telemedicine Video (Caregility) Visit  11/28/2019 Cassandra Greer [id number]  Number of [place] visits: 1 Session Start time: 2:15***  Session End time: 3:15*** Total time: {IBH Total Time:21014050} minutes  Referring Provider: Juanna Cao, MD Type of Service: Individual, Family, *** Patient/Family location: [facility] Provider location: [facility] [employer] [facility]  All persons participating in visit: Patient *** and [name] ***   I connected with [name] and/or [name]'s {family members:20773} by a video enabled telemedicine application ([place]) and verified that I am speaking with the correct person using two identifiers.   Discussed confidentiality: {YES/NO:21197}  Confirmed demographics & insurance:  {YES/NO:21197}  I discussed that engaging in this virtual visit, they consent to the provision of behavioral healthcare and the services will be billed under their insurance.   Patient and/or legal guardian expressed understanding and consented to virtual visit: {YES/NO:21197}  PRESENTING CONCERNS: Patient and/or family reports the following symptoms/concerns: *** Duration of problem: ***; Severity of problem: {Mild/Moderate/Severe:20260}  STRENGTHS (Protective Factors/Coping Skills): {CHL AMB BH PROTECTIVE FACTORS/STRENGTHS:[phone]}  ASSESSMENT: Patient currently experiencing ***.    GOALS ADDRESSED: Patient will: 1.  Reduce symptoms of: {IBH Symptoms:21014056}  2.  Increase knowledge and/or ability of: {IBH Patient Tools:21014057}  3.  Demonstrate ability to: {IBH Goals:21014053}   Progress of Goals: {CHL AMB BH PROGRESS TOWARDS NWGNF:[id number]}  INTERVENTIONS: Interventions utilized:  {IBH Interventions:21014054} Standardized Assessments completed & reviewed: {IBH Screening Tools:21014051}   OUTCOME: Patient Response:  ***   PLAN: 1. Follow up with behavioral health clinician on : *** 2. Behavioral recommendations: *** 3. Referral(s): {IBH Referrals:21014055}  I discussed the assessment and treatment plan with the patient and/or parent/guardian. They were provided an opportunity to ask questions and all were answered. They agreed with the plan and demonstrated an understanding of the instructions.   They were advised to call back or seek an in-person evaluation as appropriate.  I discussed that the purpose of this visit is to provide behavioral health care while limiting exposure to the novel coronavirus.  Discussed there is a possibility of technology failure and discussed alternative modes of communication if that failure occurs.  [name]

## 2019-11-28 NOTE — ED Notes (Signed)
Pt reports pain increase stating "this pain is worst than when I came in." Provider made aware.

## 2019-11-28 NOTE — ED Provider Notes (Signed)
[facility] EMERGENCY DEPARTMENT Provider Note   CSN: [id number] Arrival date & time: 11/27/19  3790     History Chief Complaint  Patient presents with  . Hernia    Cassandra Greer is a 44 y.o. female with a history of diabetes mellitus type 2, morbid obesity, HTN, and umbilical hernia who presents to the emergency department with a chief complaint of abdominal pain.   The patient endorses sudden onset, worsening abdominal pain for the last 5 days.  She has a known periumbilical hernia and reports that pain is significantly increased and localized around her hernia.  She was seen by her surgeon, Dr. Georgette Dover, who has advised that she needs surgical intervention, but she was unable to schedule the surgery due to financial constraints.  She followed up with her PCP for help with pain control after her symptoms worsened and was advised to come to the ER for further evaluation as she has been unable to control her symptoms at home with Tylenol and ibuprofen.  Pain has been constant since onset.  It is nonradiating.  She also notes that she has not had a bowel movement in 5 days and is not passing flatus.  She is having associated nausea, but no vomiting, diarrhea, fever, chills, flank pain, dysuria, hematuria, vaginal pain, chest pain, shortness of breath, or URI symptoms.  The history is provided by the patient and medical records. No language interpreter was used.       Past Medical History:  Diagnosis Date  . Chest pain   . Colitis   . Hypertension   . Marijuana abuse   . Morbid obesity ([place])   . Panic attacks   . Type 2 diabetes mellitus with hyperlipidemia ([place]) 12/22/2016    Patient Active Problem List   Diagnosis Date Noted  . Abdominal pain 03/16/2018  . Hypomagnesemia 03/16/2018  . Hypertensive urgency 03/15/2018  . Acute cholecystitis due to biliary calculus 02/20/2018  . Generalized anxiety disorder with panic attacks 12/24/2016  . Type 2  diabetes mellitus with hyperlipidemia ([place]) 12/22/2016  . Hyperlipidemia 12/22/2016  . Chest pain 12/21/2016  . Accelerated hypertension 12/21/2016  . Hyperglycemia 12/21/2016  . Epigastric abdominal pain 12/21/2016  . Fatty liver disease, nonalcoholic 24/10/7351  . Thrombocytosis 12/21/2016  . Panic disorder (episodic paroxysmal anxiety) 12/21/2016  . HTN (hypertension) 03/08/2013  . Hypokalemia 03/08/2013  . Headache(784.0) 03/08/2013  . Colitis 03/05/2013  . Leukocytosis 03/05/2013  . Sinusitis 03/05/2013    Past Surgical History:  Procedure Laterality Date  . CHOLECYSTECTOMY N/A 02/21/2018   Procedure: LAPAROSCOPIC CHOLECYSTECTOMY WITH INTRAOPERATIVE CHOLANGIOGRAM;  Surgeon: Donnie Mesa, MD;  Location: [facility];  Service: General;  Laterality: N/A;  . TUBAL LIGATION       OB History    Gravida  5   Para  3   Term  3   Preterm  0   AB  2   Living  3     SAB  0   TAB  2   Ectopic  0   Multiple  0   Live Births  3           Family History  Problem Relation Age of Onset  . Hypertension Other   . Diabetes Mellitus II Other   . Bipolar disorder Mother   . Other Father        unaware of his PMH    Social History   Tobacco Use  . Smoking status: Never Smoker  .  Smokeless tobacco: Never Used  Vaping Use  . Vaping Use: Never used  Substance Use Topics  . Alcohol use: No  . Drug use: Yes    Types: Marijuana    Comment: every day    Home Medications Prior to Admission medications   Medication Sig Start Date End Date Taking? Authorizing Provider  amLODipine (NORVASC) 5 MG tablet Take 10 mg by mouth daily.   Yes [provider]  atorvastatin (LIPITOR) 20 MG tablet Take 20 mg by mouth daily.   Yes [provider]  famotidine (PEPCID) 20 MG tablet Take 1 tablet (20 mg total) by mouth 2 (two) times daily. 10/02/18 11/28/19 Yes Merlyn Lot, MD  hydrochlorothiazide (HYDRODIURIL) 25 MG tablet Take 0.5 tablets (12.5 mg total) by  mouth daily. Patient taking differently: Take 6.25 mg by mouth daily.  03/17/18  Yes Roxan Hockey, MD  hydrOXYzine (ATARAX/VISTARIL) 25 MG tablet Take 1 tablet (25 mg total) by mouth 3 (three) times daily as needed for anxiety. 03/17/18  Yes Emokpae, Courage, MD  lisinopril (ZESTRIL) 40 MG tablet Take 40 mg by mouth daily.    Yes [provider]  medroxyPROGESTERone (PROVERA) 5 MG tablet Take 1 tablet (5 mg total) by mouth daily. Alternate 14 days taking pill, then 14 days off. Do this monthly 11/16/19 11/15/20 Yes Cherre Blanc, MD  polyethylene glycol (MIRALAX / GLYCOLAX) packet Take 17 g by mouth daily as needed for mild constipation or moderate constipation. 03/17/18  Yes Emokpae, Courage, MD  QUEtiapine (SEROQUEL) 50 MG tablet Take 50-100 mg by mouth See admin instructions. 50mg  in am and 100mg  at night   Yes [provider]  ibuprofen (ADVIL) 600 MG tablet Take 1 tablet (600 mg total) by mouth every 6 (six) hours as needed. 08/04/19   Fransico Meadow, PA-C  ondansetron (ZOFRAN-ODT) 4 MG disintegrating tablet Take 1 tablet (4 mg total) by mouth every 6 (six) hours as needed for nausea or vomiting. 03/17/18   Roxan Hockey, MD  promethazine (PHENERGAN) 12.5 MG tablet Take 1 tablet (12.5 mg total) by mouth every 6 (six) hours as needed for nausea or vomiting. 10/02/18   Merlyn Lot, MD    Allergies    Morphine and related  Review of Systems   Review of Systems  Constitutional: Negative for activity change, chills and fever.  HENT: Negative for congestion and sore throat.   Respiratory: Negative for cough and shortness of breath.   Cardiovascular: Negative for chest pain.  Gastrointestinal: Positive for abdominal pain and nausea. Negative for blood in stool, constipation, diarrhea and vomiting.  Genitourinary: Negative for dysuria, frequency, hematuria, urgency, vaginal bleeding and vaginal pain.  Musculoskeletal: Negative for back pain, myalgias, neck pain and neck  stiffness.  Skin: Negative for rash.  Allergic/Immunologic: Negative for immunocompromised state.  Neurological: Negative for dizziness, seizures, syncope, weakness, numbness and headaches.  Psychiatric/Behavioral: Negative for confusion.    Physical Exam Updated Vital Signs BP 114/75   Pulse 61   Temp 97.6 F (36.4 C) (Oral)   Resp 16   Ht 5\' 6"  (1.676 m)   Wt 108.9 kg   SpO2 95%   BMI 38.74 kg/m   Physical Exam Vitals and nursing note reviewed.  Constitutional:      General: She is not in acute distress.    Appearance: She is obese. She is not ill-appearing, toxic-appearing or diaphoretic.     Comments: Uncomfortable appearing.  No acute distress.  HENT:     Head: Normocephalic.  Eyes:     Conjunctiva/sclera: Conjunctivae normal.  Cardiovascular:     Rate and Rhythm: Normal rate and regular rhythm.     Heart sounds: No murmur heard.  No friction rub. No gallop.   Pulmonary:     Effort: Pulmonary effort is normal. No respiratory distress.     Breath sounds: No stridor. No wheezing, rhonchi or rales.  Chest:     Chest wall: No tenderness.  Abdominal:     General: There is no distension.     Palpations: Abdomen is soft.     Tenderness: There is abdominal tenderness.     Comments: She is diffusely tender to palpation throughout the abdomen, but maximal tenderness is in the periumbilical region.  There is voluntary guarding, but no rebound tenderness.  Abdomen is soft and nondistended.  Hypoactive bowel sounds.  Negative Murphy sign.  No CVA tenderness bilaterally.  No tenderness over McBurney's point.  Musculoskeletal:     Cervical back: Neck supple.     Right lower leg: No edema.     Left lower leg: No edema.  Skin:    General: Skin is warm.     Findings: No rash.  Neurological:     Mental Status: She is alert.  Psychiatric:        Behavior: Behavior normal.     ED Results / Procedures / Treatments   Labs (all labs ordered are listed, but only abnormal  results are displayed) Labs Reviewed  COMPREHENSIVE METABOLIC PANEL - Abnormal; Notable for the following components:      Result Value   Glucose, Bld 159 (*)    AST 13 (*)    All other components within normal limits  CBC WITH DIFFERENTIAL/PLATELET - Abnormal; Notable for the following components:   WBC 12.5 (*)    Hemoglobin 11.6 (*)    MCH 25.8 (*)    Platelets 457 (*)    Neutro Abs 9.3 (*)    All other components within normal limits  URINALYSIS, ROUTINE W REFLEX MICROSCOPIC - Abnormal; Notable for the following components:   APPearance HAZY (*)    Specific Gravity, Urine 1.004 (*)    Hgb urine dipstick LARGE (*)    Protein, ur 30 (*)    Leukocytes,Ua TRACE (*)    Bacteria, UA RARE (*)    All other components within normal limits  RESPIRATORY PANEL BY RT PCR (FLU A&B, COVID)  LIPASE, BLOOD  LACTIC ACID, PLASMA  LACTIC ACID, PLASMA  I-STAT BETA HCG BLOOD, ED ([facility], [facility], [facility] ONLY)    EKG None  Radiology CT ABDOMEN PELVIS W CONTRAST  Result Date: 11/27/2019 CLINICAL DATA:  Abdominal pain, hernia suspected EXAM: CT ABDOMEN AND PELVIS WITH CONTRAST TECHNIQUE: Multidetector CT imaging of the abdomen and pelvis was performed using the standard protocol following bolus administration of intravenous contrast. CONTRAST:  154mL OMNIPAQUE IOHEXOL 300 MG/ML  SOLN COMPARISON:  08/04/2019 FINDINGS: Lower chest: No acute abnormality. Hepatobiliary: No focal liver abnormality is seen. Status post cholecystectomy. No biliary dilatation. Pancreas: Unremarkable. No pancreatic ductal dilatation or surrounding inflammatory changes. Spleen: Normal in size without significant abnormality. Adrenals/Urinary Tract: Adrenal glands are unremarkable. Kidneys are normal, without renal calculi, solid lesion, or hydronephrosis. Bladder is unremarkable. Stomach/Bowel: Stomach is within normal limits. Appendix appears normal. No evidence of bowel wall thickening, distention, or inflammatory changes.  Vascular/Lymphatic: Aortic atherosclerosis. No enlarged abdominal or pelvic lymph nodes. Reproductive: No mass or other significant abnormality. Other: Redemonstrated broad-based fat containing periumbilical hernia, measuring approximately 4.0  cm (series 3, image 44). No evidence of bowel involvement or fat stranding. There is subtle fat stranding of the omentum in the low abdomen inferior to the hernia, a region measuring approximately 5.5 x 2.7 cm (series 3, image 56). No abdominopelvic ascites. Musculoskeletal: No acute or significant osseous findings. IMPRESSION: 1. Redemonstrated broad-based fat containing periumbilical hernia, measuring approximately 4.0 cm. No evidence of bowel involvement or fat stranding at this time. 2. There is subtle fat stranding of the omentum in the low abdomen inferior to the hernia, a region measuring approximately 5.5 x 2.7 cm. This finding is nonspecific and may be related to omental inflammation or infarction. 3. Aortic Atherosclerosis (ICD10-I70.0), advanced for patient age. Electronically Signed   By: Eddie Candle M.D.   On: 11/27/2019 12:55    Procedures Procedures (including critical care time)  Medications Ordered in ED Medications  fentaNYL (SUBLIMAZE) injection 100 mcg (has no administration in time range)  iohexol (OMNIPAQUE) 300 MG/ML solution 100 mL (100 mLs Intravenous Contrast Given 11/27/19 1239)  HYDROmorphone (DILAUDID) injection 1 mg (1 mg Intravenous Given 11/28/19 0035)  ondansetron (ZOFRAN) injection 4 mg (4 mg Intravenous Given 11/28/19 0035)  promethazine (PHENERGAN) injection 25 mg (25 mg Intravenous Given 11/28/19 0143)  sodium chloride 0.9 % bolus 1,000 mL (0 mLs Intravenous Stopped 11/28/19 0409)  morphine 4 MG/ML injection 4 mg (4 mg Intravenous Given 11/28/19 0237)  diphenhydrAMINE (BENADRYL) capsule 25 mg (25 mg Oral Given 11/28/19 0239)  fentaNYL (SUBLIMAZE) injection 100 mcg (100 mcg Intravenous Given 11/28/19 0403)    ED Course   I have reviewed the triage vital signs and the nursing notes.  Pertinent labs & imaging results that were available during my care of the patient were reviewed by me and considered in my medical decision making (see chart for details).  Clinical Course as of Nov 27 733  Tue Nov 28, 2019  0040 Spoke with Dr. Donne Hazel, general surgery, regarding the patient's abdominal pain and ER work-up thus far.  Based on imaging today, there is no indication for emergent surgery.  If patient's pain is able to be controlled, she can follow-up with surgery in the clinic.  However, if pain is unable to be controlled, she may need to be admitted for pain control.  He request to call back if pain is unable to be controlled.   [MM]  8182 Notified by nursing staff the patient is actively vomiting.  Will order Phenergan and reassess.   [MM]    Clinical Course User Index [MM] Doha Boling, Laymond Purser, PA-C   MDM Rules/Calculators/A&P                          44 year old female with a history of diabetes mellitus type 2, morbid obesity, HTN, and umbilical hernia presenting with worsening generalized abdominal pain and nausea over the last 4 days.  She has a known periumbilical hernia and is followed by general surgery.  She has been unable to schedule repair of the hernia due to financial constraints.  Pain has been tolerable until the last few days when it significantly worsened.  She attempted to follow-up with primary care for pain control as her home Motrin and Tylenol were no longer improving her pain.  She was advised to come to the ER by her PCP.  Afebrile.  Normotensive.  No tachycardia or tachypnea.  On exam, she is diffusely tender to palpation throughout the abdomen.  She appears uncomfortable, but is in  no acute distress.  There is guarding, but no rebound.  Labs and imaging have been reviewed and independently evaluated by me.  UA with large hemoglobinuria, but patient is on her menstrual cycle.  She has a  mild leukocytosis of 12.5.  Hemoglobin is stable.  Labs are otherwise unremarkable.  CT abdomen pelvis demonstrates a fat-containing periumbilical hernia that measures approximately 4 cm no bowel involvement or fat stranding at this time.  There is also subtle fat stranding of the omentum in the low abdomen that is inferior to the hernia measures approximately 5.5 x 2.7 cm that may be related to omental inflammation or infarction.  CT was compared by me to previous imaging on 6/25.  The patient was discussed with Dr. Dina Rich, attending physician.  Given concern for inflammation or or infarction with significantly worsening abdominal pain, general surgery was consulted and Dr. Donne Hazel recommends pain control.  No indication for emergent surgery at this time.  However, pain is unable to be controlled, she may require admission.  Patient was given multiple doses of pain medication.  She also began actively vomiting after receiving Zofran.  Vomiting is since subsided, but pain is not improving.  Updated Dr. Donne Hazel and general surgery will accept the patient for admission.  She may require surgical repair during her admission.  We will keep patient n.p.o. at this time.  The patient appears reasonably stabilized for admission considering the current resources, flow, and capabilities available in the ED at this time, and I doubt any other [facility] requiring further screening and/or treatment in the ED prior to admission.   Final Clinical Impression(s) / ED Diagnoses Final diagnoses:  Periumbilical hernia    Rx / DC Orders ED Discharge Orders    None       Joanne Gavel, PA-C 11/28/19 0739    Merryl Hacker, MD 11/29/19 [phone]

## 2019-11-28 NOTE — Anesthesia Preprocedure Evaluation (Addendum)
Anesthesia Evaluation  Patient identified by MRN, date of birth, ID band Patient awake    Reviewed: Patient's Chart, lab work & pertinent test results  Airway Mallampati: II  TM Distance: >3 FB Neck ROM: Full    Dental  (+) Teeth Intact   Pulmonary neg pulmonary ROS,    Pulmonary exam normal        Cardiovascular hypertension, Pt. on medications  Rhythm:Regular Rate:Normal     Neuro/Psych Anxiety negative neurological ROS     GI/Hepatic Neg liver ROS, Umbilical hernia   Endo/Other  diabetes, Well Controlled, Type 2  Renal/GU negative Renal ROS     Musculoskeletal negative musculoskeletal ROS (+)   Abdominal (+)  Abdomen: soft. Bowel sounds: normal.  Peds  Hematology negative hematology ROS (+)   Anesthesia Other Findings   Reproductive/Obstetrics                            Anesthesia Physical Anesthesia Plan  ASA: II  Anesthesia Plan: General   Post-op Pain Management:    Induction:   PONV Risk Score and Plan: 3 and Ondansetron, Dexamethasone, Treatment may vary due to age or medical condition and Midazolam  Airway Management Planned: Mask and Oral ETT  Additional Equipment: None  Intra-op Plan:   Post-operative Plan: Extubation in OR  Informed Consent: I have reviewed the patients History and Physical, chart, labs and discussed the procedure including the risks, benefits and alternatives for the proposed anesthesia with the patient or authorized representative who has indicated his/her understanding and acceptance.     Dental advisory given  Plan Discussed with: CRNA and Surgeon  Anesthesia Plan Comments: (Lab Results      Component                Value               Date                      WBC                      11.2 (H)            11/28/2019                HGB                      10.0 (L)            11/28/2019                HCT                      33.0  (L)            11/28/2019                MCV                      86.4                11/28/2019                PLT                      391                 11/28/2019  Lab Results      Component                Value               Date                      NA                       139                 11/27/2019                K                        3.5                 11/27/2019                CO2                      24                  11/27/2019                GLUCOSE                  159 (H)             11/27/2019                BUN                      9                   11/27/2019                CREATININE               0.61                11/28/2019                CALCIUM                  9.6                 11/27/2019                GFRNONAA                 >60                 11/28/2019                GFRAA                    >60                 08/04/2019          )        Anesthesia Quick Evaluation

## 2019-11-28 NOTE — Interval H&P Note (Signed)
History and Physical Interval Note:  11/28/2019 1:48 PM  Cassandra Greer Citizen  has presented today for surgery, with the diagnosis of incisional hernia.  The various methods of treatment have been discussed with the patient and family. After consideration of risks, benefits and other options for treatment, the patient has consented to  Procedure(s): OPEN HERNIA REPAIR VENTRAL (N/A) as a surgical intervention.  The patient's history has been reviewed, patient examined, no change in status, stable for surgery.  I have reviewed the patient's chart and labs.  Questions were answered to the patient's satisfaction.   PT SEEN EXAMINED AND AGREE PROCEED WITH REPAIR OF HERNIA WITH MESH The risk of hernia repair include bleeding,  Infection,   Recurrence of the hernia,  Mesh use, chronic pain,  Organ injury,  Bowel injury,  Bladder injury,   nerve injury with numbness around the incision,  Death,  and worsening of preexisting  medical problems.  The alternatives to surgery have been discussed as well..  Long term expectations of both operative and non operative treatments have been discussed.   The patient agrees to proceed.   Turner Daniels MD

## 2019-11-28 NOTE — Transfer of Care (Signed)
Immediate Anesthesia Transfer of Care Note  Patient: Cassandra Greer  Procedure(s) Performed: OPEN VENTRAL HERNIA REPAIR (N/A Abdomen) INSERTION OF MESH (N/A Abdomen)  Patient Location: PACU  Anesthesia Type:General  Level of Consciousness: awake  Airway & Oxygen Therapy: Patient Spontanous Breathing  Post-op Assessment: Report given to RN and Post -op Vital signs reviewed and stable  Post vital signs: Reviewed and stable  Last Vitals:  Vitals Value Taken Time  BP 104/68 11/28/19 1541  Temp 36.7 C 11/28/19 1541  Pulse 75 11/28/19 1542  Resp 21 11/28/19 1542  SpO2 91 % 11/28/19 1542  Vitals shown include unvalidated device data.  Last Pain:  Vitals:   11/28/19 1305  TempSrc:   PainSc: 10-Worst pain ever      Patients Stated Pain Goal: 2 (01/31/40 1464)  Complications: No complications documented.

## 2019-11-28 NOTE — H&P (Signed)
Cassandra Greer Admission Note  Cassandra Greer Apr 28, 1975  [id number].    Requesting MD: Joline Maxcy PA-C Chief Complaint/Reason for Consult: hernia HPI:  Patient is a 44 year old female who presented to [facility] with abdominal pain for 5 days. She reports known umbilical hernia, she was seen by Dr. Georgette Dover in August but was unable to schedule [facility] because of financial constraints. Patient reports hernia has been out since Wednesday last week. May have gone back in a little but still reports significant tenderness. She does report some nausea and vomiting that she feels is secondary to pain, and she has not had a BM since Thursday. Denies fever, chills, chest pain, SOB, urinary symptoms. Past abdominal Greer includes a laparoscopic cholecystectomy and tubal ligation. PMH significant for HTN, HLD, T2DM, Anxiety/panic attacks, morbid obesity. She denies alcohol and tobacco use. Reports marijuana use. She lives at home and does not currently work. She has adult children who can help her if needed.   ROS: Review of Systems  Constitutional: Negative for chills and fever.  Respiratory: Negative for shortness of breath and wheezing.   Cardiovascular: Negative for chest pain and palpitations.  Gastrointestinal: Positive for abdominal pain, constipation, nausea and vomiting.  Genitourinary: Negative for dysuria, frequency and urgency.  All other systems reviewed and are negative.   Family History  Problem Relation Age of Onset  . Hypertension Other   . Diabetes Mellitus II Other   . Bipolar disorder Mother   . Other Father        unaware of his PMH    Past Medical History:  Diagnosis Date  . Chest pain   . Colitis   . Hypertension   . Marijuana abuse   . Morbid obesity ([place])   . Panic attacks   . Type 2 diabetes mellitus with hyperlipidemia ([place]) 12/22/2016    Past Surgical History:  Procedure Laterality Date  . CHOLECYSTECTOMY N/A 02/21/2018   Procedure: LAPAROSCOPIC  CHOLECYSTECTOMY WITH INTRAOPERATIVE CHOLANGIOGRAM;  Surgeon: Donnie Mesa, MD;  Location: [facility];  Service: General;  Laterality: N/A;  . TUBAL LIGATION      Social History:  reports that she has never smoked. She has never used smokeless tobacco. She reports current drug use. Drug: Marijuana. She reports that she does not drink alcohol.  Allergies:  Allergies  Allergen Reactions  . Morphine And Related Itching    (Not in a hospital admission)   Blood pressure 114/75, pulse 61, temperature 97.6 F (36.4 C), temperature source Oral, resp. rate 16, height 5\' 6"  (1.676 m), weight 108.9 kg, SpO2 95 %. Physical Exam:  General: pleasant, WD, obese female who appears uncomfortable HEENT:  Sclera are noninjected.  PERRL.  Ears and nose without any masses or lesions.  Mouth is pink and moist Heart: regular, rate, and rhythm.  Normal s1,s2. No obvious murmurs, gallops, or rubs noted.  Palpable radial and pedal pulses bilaterally Lungs: CTAB, no wheezes, rhonchi, or rales noted.  Respiratory effort nonlabored Abd: soft, ttp supraumbilically with firm tissue in hernia, not currently reducible secondary to pain, no overlying skin changes, ND, +BS MS: all 4 extremities are symmetrical with no cyanosis, clubbing, or edema. Skin: warm and dry with no masses, lesions, or rashes Neuro: Cranial nerves 2-12 grossly intact, sensation grossly intact throughout Psych: A&Ox3 with an appropriate affect.   Results for orders placed or performed during the hospital encounter of 11/27/19 (from the past 48 hour(s))  Comprehensive metabolic panel     Status: Abnormal  Collection Time: 11/27/19  9:47 AM  Result Value Ref Range   Sodium 139 135 - 145 mmol/L   Potassium 3.5 3.5 - 5.1 mmol/L   Chloride 104 98 - 111 mmol/L   CO2 24 22 - 32 mmol/L   Glucose, Bld 159 (H) 70 - 99 mg/dL    Comment: Glucose reference range applies only to samples taken after fasting for at least 8 hours.   BUN 9 6 - 20 mg/dL    Creatinine, Ser 0.68 0.44 - 1.00 mg/dL   Calcium 9.6 8.9 - 10.3 mg/dL   Total Protein 7.2 6.5 - 8.1 g/dL   Albumin 3.8 3.5 - 5.0 g/dL   AST 13 (L) 15 - 41 U/L   ALT 13 0 - 44 U/L   Alkaline Phosphatase 81 38 - 126 U/L   Total Bilirubin 0.5 0.3 - 1.2 mg/dL   GFR, Estimated >60 >60 mL/min   Anion gap 11 5 - 15    Comment: Performed at [facility], [place] [name], [place], [place] [address]  Lipase, blood     Status: None   Collection Time: 11/27/19  9:47 AM  Result Value Ref Range   Lipase 31 11 - 51 U/L    Comment: Performed at [facility] [address] [place], [place] [address]  CBC with Diff     Status: Abnormal   Collection Time: 11/27/19  9:47 AM  Result Value Ref Range   WBC 12.5 (H) 4.0 - 10.5 K/uL   RBC 4.50 3.87 - 5.11 MIL/uL   Hemoglobin 11.6 (L) 12.0 - 15.0 g/dL   HCT 38.7 36 - 46 %   MCV 86.0 80.0 - 100.0 fL   MCH 25.8 (L) 26.0 - 34.0 pg   MCHC 30.0 30.0 - 36.0 g/dL   RDW 14.6 11.5 - 15.5 %   Platelets 457 (H) 150 - 400 K/uL   nRBC 0.0 0.0 - 0.2 %   Neutrophils Relative % 73 %   Neutro Abs 9.3 (H) 1.7 - 7.7 K/uL   Lymphocytes Relative 17 %   Lymphs Abs 2.1 0.7 - 4.0 K/uL   Monocytes Relative 5 %   Monocytes Absolute 0.6 0.1 - 1.0 K/uL   Eosinophils Relative 3 %   Eosinophils Absolute 0.4 0.0 - 0.5 K/uL   Basophils Relative 1 %   Basophils Absolute 0.1 0.0 - 0.1 K/uL   Immature Granulocytes 1 %   Abs Immature Granulocytes 0.06 0.00 - 0.07 K/uL    Comment: Performed at [facility], [place] [address], [place], [place] [address]  Urinalysis, Routine w reflex microscopic Urine, Random     Status: Abnormal   Collection Time: 11/27/19  9:47 AM  Result Value Ref Range   Color, Urine YELLOW YELLOW   APPearance HAZY (A) CLEAR   Specific Gravity, Urine 1.004 (L) 1.005 - 1.030   pH 8.0 5.0 - 8.0   Glucose, UA NEGATIVE NEGATIVE mg/dL   Hgb urine dipstick LARGE (A) NEGATIVE   Bilirubin Urine NEGATIVE NEGATIVE   Ketones, ur NEGATIVE NEGATIVE mg/dL    Protein, ur 30 (A) NEGATIVE mg/dL   Nitrite NEGATIVE NEGATIVE   Leukocytes,Ua TRACE (A) NEGATIVE   RBC / HPF 0-5 0 - 5 RBC/hpf   WBC, UA 6-10 0 - 5 WBC/hpf   Bacteria, UA RARE (A) NONE SEEN   Squamous Epithelial / LPF 6-10 0 - 5   Mucus PRESENT    Amorphous [place]ila PRESENT  Comment: Performed at [facility], [place] [address], [place], [place] [address]  Lactic acid, plasma     Status: None   Collection Time: 11/27/19  9:54 AM  Result Value Ref Range   Lactic Acid, Venous 1.3 0.5 - 1.9 mmol/L    Comment: Performed at [facility] [address] [place], [place] [address]  I-Stat beta hCG blood, ED     Status: None   Collection Time: 11/27/19 10:09 AM  Result Value Ref Range   I-stat hCG, quantitative <5.0 <5 mIU/mL   Comment 3            Comment:   GEST. AGE      CONC.  (mIU/mL)   <=1 WEEK        5 - 50     2 WEEKS       50 - 500     3 WEEKS       100 - 10,000     4 WEEKS     1,000 - 30,000        FEMALE AND NON-PREGNANT FEMALE:     LESS THAN 5 mIU/mL   Respiratory Panel by RT PCR (Flu A&B, Covid) - Nasopharyngeal Swab     Status: None   Collection Time: 11/28/19  6:34 AM   Specimen: Nasopharyngeal Swab  Result Value Ref Range   SARS Coronavirus 2 by RT PCR NEGATIVE NEGATIVE    Comment: (NOTE) SARS-CoV-2 target nucleic acids are NOT DETECTED.  The SARS-CoV-2 RNA is generally detectable in upper respiratoy specimens during the acute phase of infection. The lowest concentration of SARS-CoV-2 viral copies this assay can detect is 131 copies/mL. A negative result does not preclude SARS-Cov-2 infection and should not be used as the sole basis for treatment or other patient management decisions. A negative result may occur with  improper specimen collection/handling, submission of specimen other than nasopharyngeal swab, presence of viral mutation(s) within the areas targeted by this assay, and inadequate number of viral copies (<131 copies/mL). A negative  result must be combined with clinical observations, patient history, and epidemiological information. The expected result is Negative.  Fact Sheet for Patients:  PinkCheek.be  Fact Sheet for Healthcare Providers:  GravelBags.it  This test is no t yet approved or cleared by the [place] FDA and  has been authorized for detection and/or diagnosis of SARS-CoV-2 by FDA under an Emergency Use Authorization (EUA). This EUA will remain  in effect (meaning this test can be used) for the duration of the COVID-19 declaration under Section 564(b)(1) of the Act, 21 U.S.C. section 360bbb-3(b)(1), unless the authorization is terminated or revoked sooner.     Influenza A by PCR NEGATIVE NEGATIVE   Influenza B by PCR NEGATIVE NEGATIVE    Comment: (NOTE) The Xpert Xpress SARS-CoV-2/FLU/RSV assay is intended as an aid in  the diagnosis of influenza from Nasopharyngeal swab specimens and  should not be used as a sole basis for treatment. Nasal washings and  aspirates are unacceptable for Xpert Xpress SARS-CoV-2/FLU/RSV  testing.  Fact Sheet for Patients: PinkCheek.be  Fact Sheet for Healthcare Providers: GravelBags.it  This test is not yet approved or cleared by the [place] FDA and  has been authorized for detection and/or diagnosis of SARS-CoV-2 by  FDA under an Emergency Use Authorization (EUA). This EUA will remain  in effect (meaning this test can be used) for the duration of the  Covid-19 declaration under Section 564(b)(1) of the Act, 21  U.S.C. section 360bbb-3(b)(1), unless the authorization is  terminated or revoked. Performed at [facility], [place] [address] [place], [place] [address]    CT ABDOMEN PELVIS W CONTRAST  Result Date: 11/27/2019 CLINICAL DATA:  Abdominal pain, hernia suspected EXAM: CT ABDOMEN AND PELVIS WITH CONTRAST TECHNIQUE:  Multidetector CT imaging of the abdomen and pelvis was performed using the standard protocol following bolus administration of intravenous contrast. CONTRAST:  111mL OMNIPAQUE IOHEXOL 300 MG/ML  SOLN COMPARISON:  08/04/2019 FINDINGS: Lower chest: No acute abnormality. Hepatobiliary: No focal liver abnormality is seen. Status post cholecystectomy. No biliary dilatation. Pancreas: Unremarkable. No pancreatic ductal dilatation or surrounding inflammatory changes. Spleen: Normal in size without significant abnormality. Adrenals/Urinary Tract: Adrenal glands are unremarkable. Kidneys are normal, without renal calculi, solid lesion, or hydronephrosis. Bladder is unremarkable. Stomach/Bowel: Stomach is within normal limits. Appendix appears normal. No evidence of bowel wall thickening, distention, or inflammatory changes. Vascular/Lymphatic: Aortic atherosclerosis. No enlarged abdominal or pelvic lymph nodes. Reproductive: No mass or other significant abnormality. Other: Redemonstrated broad-based fat containing periumbilical hernia, measuring approximately 4.0 cm (series 3, image 44). No evidence of bowel involvement or fat stranding. There is subtle fat stranding of the omentum in the low abdomen inferior to the hernia, a region measuring approximately 5.5 x 2.7 cm (series 3, image 56). No abdominopelvic ascites. Musculoskeletal: No acute or significant osseous findings. IMPRESSION: 1. Redemonstrated broad-based fat containing periumbilical hernia, measuring approximately 4.0 cm. No evidence of bowel involvement or fat stranding at this time. 2. There is subtle fat stranding of the omentum in the low abdomen inferior to the hernia, a region measuring approximately 5.5 x 2.7 cm. This finding is nonspecific and may be related to omental inflammation or infarction. 3. Aortic Atherosclerosis (ICD10-I70.0), advanced for patient age. Electronically Signed   By: Eddie Candle M.D.   On: 11/27/2019 12:55       Assessment/Plan HTN HLD T2DM Anxiety/panic attacks morbid obesity - BMI 38.74  Incisional hernia  - CT shows 4 cm hernia containing fat only with possible omental infarction  - WBC mildly elevated at 12, afeb - recommend proceeding to OR for hernia repair and patient is in agreement with this plan - will admit to observation post-operatively and likely discharge tomorrow if surgically stable   Cassandra Greer, Cassandra Greer Greer 11/28/2019, 8:11 AM Please see Amion for pager number during day hours 7:00am-4:30pm

## 2019-11-28 NOTE — ED Notes (Signed)
Pt actively vomiting provider made aware at this time.

## 2019-11-28 NOTE — Anesthesia Procedure Notes (Signed)
Procedure Name: Intubation Performed by: Milford Cage, CRNA Pre-anesthesia Checklist: Patient identified, Emergency Drugs available, Suction available and Patient being monitored Patient Re-evaluated:Patient Re-evaluated prior to induction Oxygen Delivery Method: Circle System Utilized Preoxygenation: Pre-oxygenation with 100% oxygen Induction Type: IV induction Ventilation: Mask ventilation with difficulty, Oral airway inserted - appropriate to patient size and Two handed mask ventilation required Grade View: Grade II Tube type: Oral Tube size: 7.0 mm Number of attempts: 1 Airway Equipment and Method: Stylet and Oral airway Placement Confirmation: ETT inserted through vocal cords under direct vision,  positive ETCO2 and breath sounds checked- equal and bilateral Secured at: 22 cm Tube secured with: Tape Dental Injury: Teeth and Oropharynx as per pre-operative assessment  Comments: Small mouth opening

## 2019-11-28 NOTE — Anesthesia Postprocedure Evaluation (Signed)
Anesthesia Post Note  Patient: Cassandra Greer  Procedure(s) Performed: OPEN VENTRAL HERNIA REPAIR (N/A Abdomen) INSERTION OF MESH (N/A Abdomen)     Patient location during evaluation: PACU Anesthesia Type: General Level of consciousness: awake and alert Pain management: pain level controlled Vital Signs Assessment: post-procedure vital signs reviewed and stable Respiratory status: spontaneous breathing, nonlabored ventilation, respiratory function stable and patient connected to nasal cannula oxygen Cardiovascular status: blood pressure returned to baseline and stable Postop Assessment: no apparent nausea or vomiting Anesthetic complications: no   No complications documented.  Last Vitals:  Vitals:   11/28/19 1656 11/28/19 1716  BP: 113/82 119/66  Pulse: (!) 56 (!) 52  Resp: 19 20  Temp:  36.6 C  SpO2: 97% 100%    Last Pain:  Vitals:   11/28/19 1716  TempSrc: Oral  PainSc:                  Cassandra Greer Cassandra Greer

## 2019-11-29 ENCOUNTER — Encounter (HOSPITAL_COMMUNITY): Payer: Self-pay | Admitting: Surgery

## 2019-11-29 LAB — BASIC METABOLIC PANEL
Anion gap: 12 (ref 5–15)
BUN: 7 mg/dL (ref 6–20)
CO2: 25 mmol/L (ref 22–32)
Calcium: 8.9 mg/dL (ref 8.9–10.3)
Chloride: 101 mmol/L (ref 98–111)
Creatinine, Ser: 0.74 mg/dL (ref 0.44–1.00)
GFR, Estimated: >60 mL/min (ref >60)
Glucose, Bld: 140 mg/dL — ABNORMAL HIGH (ref 70–99)
Potassium: 3.2 mmol/L — ABNORMAL LOW (ref 3.5–5.1)
Sodium: 138 mmol/L (ref 135–145)

## 2019-11-29 LAB — CBC
HCT: 33.7 % — ABNORMAL LOW (ref 36.0–46.0)
Hemoglobin: 10.6 g/dL — ABNORMAL LOW (ref 12.0–15.0)
MCH: 26.2 pg (ref 26.0–34.0)
MCHC: 31.5 g/dL (ref 30.0–36.0)
MCV: 83.4 fL (ref 80.0–100.0)
Platelets: 441 10*3/uL — ABNORMAL HIGH (ref 150–400)
RBC: 4.04 MIL/uL (ref 3.87–5.11)
RDW: 14.6 % (ref 11.5–15.5)
WBC: 16.8 10*3/uL — ABNORMAL HIGH (ref 4.0–10.5)
nRBC: 0 % (ref 0.0–0.2)

## 2019-11-29 LAB — GLUCOSE, CAPILLARY
Glucose-Capillary: 103 mg/dL — ABNORMAL HIGH (ref 70–99)
Glucose-Capillary: 115 mg/dL — ABNORMAL HIGH (ref 70–99)

## 2019-11-29 MED ORDER — ONDANSETRON HCL 4 MG PO TABS: 4.0000 mg | ORAL_TABLET | Freq: Every day | ORAL | 1 refills | Status: AC | PRN

## 2019-11-29 MED ORDER — IBUPROFEN 800 MG PO TABS: 800.0000 mg | ORAL_TABLET | Freq: Three times a day (TID) | ORAL | 0 refills | Status: DC | PRN

## 2019-11-29 MED ORDER — OXYCODONE HCL 5 MG PO TABS: 5.0000 mg | ORAL_TABLET | Freq: Four times a day (QID) | ORAL | 0 refills | Status: DC | PRN

## 2019-11-29 NOTE — Plan of Care (Signed)
Adequate for discharge.

## 2019-11-29 NOTE — Progress Notes (Signed)
Patient alert and oriented, mae's well, voiding adequate amount of urine, swallowing without difficulty, no c/o pain at time of discharge. Patient discharged home with family. Script and discharged instructions given to patient. Patient and family stated understanding of instructions given. Patient has an appointment with Dr. Brantley Stage

## 2019-11-29 NOTE — Discharge Summary (Signed)
Physician Discharge Summary  Patient ID: Cassandra Greer MRN: [id number] DOB/AGE: Jan 19, 1976 44 y.o.  Admit date: 11/27/2019 Discharge date: 11/29/2019  Admission Diagnoses:Active Problems:   Incisional hernia  Discharge Diagnoses:  Active Problems:   Incisional hernia   Discharged Condition: good  Hospital Course: Pt underwent repair of incisional hernia  With mesh. She was admitted and did well.  She had adequate pain control, tolerated her diet and could ambulate.  Her wound was clean and han no signs of infection.  She was discharged home       Treatments: surgery: repair of incisional hernia with mesh   Discharge Exam: Blood pressure 123/84, pulse 74, temperature 98.8 F (37.1 C), temperature source Oral, resp. rate 18, height 5\' 6"  (1.676 m), weight 108.9 kg, SpO2 97 %. General appearance: alert and cooperative Resp: clear to auscultation bilaterally Cardio: regular rate and rhythm, S1, S2 normal, no murmur, click, rub or gallop Skin: Skin color, texture, turgor normal. No rashes or lesions Neurologic: Grossly normal Incision/Wound:Incision CDI   Disposition: Discharge disposition: 01-Home or Self Care       Discharge Instructions    Diet - low sodium heart healthy   Complete by: As directed    Increase activity slowly   Complete by: As directed      Allergies as of 11/29/2019      Reactions   Morphine And Related Itching      Medication List    TAKE these medications   amLODipine 5 MG tablet Commonly known as: NORVASC Take 10 mg by mouth daily.   atorvastatin 20 MG tablet Commonly known as: LIPITOR Take 20 mg by mouth daily.   famotidine 20 MG tablet Commonly known as: PEPCID Take 1 tablet (20 mg total) by mouth 2 (two) times daily.   hydrochlorothiazide 25 MG tablet Commonly known as: HYDRODIURIL Take 0.5 tablets (12.5 mg total) by mouth daily. What changed: how much to take   hydrOXYzine 25 MG tablet Commonly known as:  ATARAX/VISTARIL Take 1 tablet (25 mg total) by mouth 3 (three) times daily as needed for anxiety.   ibuprofen 800 MG tablet Commonly known as: ADVIL Take 1 tablet (800 mg total) by mouth every 8 (eight) hours as needed.   lisinopril 40 MG tablet Commonly known as: ZESTRIL Take 40 mg by mouth daily.   medroxyPROGESTERone 5 MG tablet Commonly known as: PROVERA Take 1 tablet (5 mg total) by mouth daily. Alternate 14 days taking pill, then 14 days off. Do this monthly   ondansetron 4 MG tablet Commonly known as: Zofran Take 1 tablet (4 mg total) by mouth daily as needed for nausea or vomiting.   oxyCODONE 5 MG immediate release tablet Commonly known as: Oxy IR/ROXICODONE Take 1 tablet (5 mg total) by mouth every 6 (six) hours as needed for severe pain.   polyethylene glycol 17 g packet Commonly known as: MIRALAX / GLYCOLAX Take 17 g by mouth daily as needed for mild constipation or moderate constipation.   QUEtiapine 50 MG tablet Commonly known as: SEROQUEL Take 50-100 mg by mouth See admin instructions. 50mg  in am and 100mg  at night       Follow-up Information    [facility]. Go on 12/12/2019.   Specialty: General Surgery Why: Follow up appointment scheduled for 9:15 AM. Please arrive 30 min prior to appointment time. Bring photo ID and insurance information.  Contact information: [place] [address] [phone]  Signed: Turner Daniels MD  11/29/2019, 8:42 AM

## 2019-11-30 ENCOUNTER — Encounter: Payer: Self-pay | Admitting: *Deleted

## 2019-12-13 ENCOUNTER — Other Ambulatory Visit: Payer: Self-pay

## 2019-12-13 ENCOUNTER — Encounter: Payer: Self-pay | Admitting: Obstetrics & Gynecology

## 2019-12-13 ENCOUNTER — Ambulatory Visit (INDEPENDENT_AMBULATORY_CARE_PROVIDER_SITE_OTHER): Payer: Self-pay | Admitting: Obstetrics & Gynecology

## 2019-12-13 VITALS — BP 139/94 | HR 86 | Wt 239.1 lb

## 2019-12-13 DIAGNOSIS — N920 Excessive and frequent menstruation with regular cycle: Secondary | ICD-10-CM

## 2019-12-13 NOTE — Progress Notes (Signed)
Pt follow up needs to be 2-3 months to properly assess cyclic provera trial. Pt has started provera since last visit and bleeding has stopped. Will have her follow up. No complaints otherwise.

## 2019-12-26 NOTE — BH Specialist Note (Signed)
Pt did not arrive to video visit and did not answer the phone; Unable to leave voice message; left MyChart message for patient.   

## 2020-01-03 ENCOUNTER — Ambulatory Visit: Payer: Self-pay | Admitting: Clinical

## 2020-01-03 DIAGNOSIS — Z91199 Patient's noncompliance with other medical treatment and regimen due to unspecified reason: Secondary | ICD-10-CM

## 2020-01-03 DIAGNOSIS — Z5329 Procedure and treatment not carried out because of patient's decision for other reasons: Secondary | ICD-10-CM

## 2020-07-17 ENCOUNTER — Other Ambulatory Visit: Payer: Self-pay | Admitting: Nurse Practitioner

## 2020-07-17 DIAGNOSIS — Z1231 Encounter for screening mammogram for malignant neoplasm of breast: Secondary | ICD-10-CM

## 2021-02-21 IMAGING — CT CT ABD-PELV W/ CM
2 of 5 series · 16 of 46 positions shown, 18 images · IV contrast (omnipaque)
Comparison: 08/04/2019

CLINICAL DATA: Abdominal pain, hernia suspected

EXAM:
CT ABDOMEN AND PELVIS WITH CONTRAST
TECHNIQUE: Multidetector CT imaging of the abdomen and pelvis was performed
using the standard protocol following bolus administration of
intravenous contrast.
CONTRAST:  100mL OMNIPAQUE IOHEXOL 300 MG/ML  SOLN

[Series 3: abd/ pelvis 5.0 i30f 2 · axial · 0.98mm/px · z∈[+865,+1265]mm · 13 of 90 slices shown, 15 images]
[im 5/90  soft-tissue]
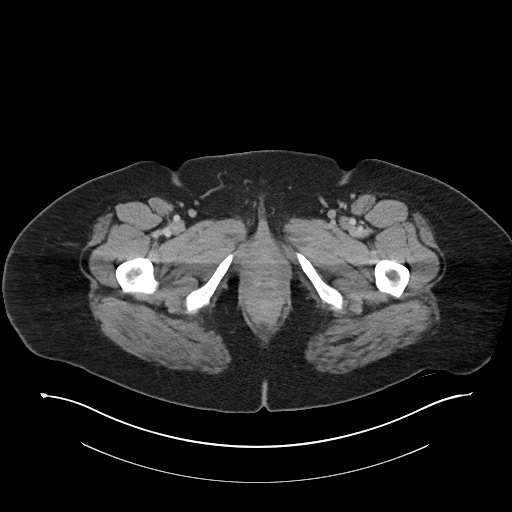
[im 5/90  bone]
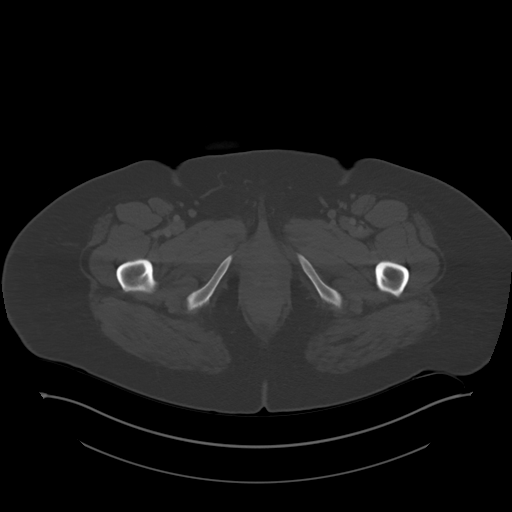
[im 10/90  soft-tissue]
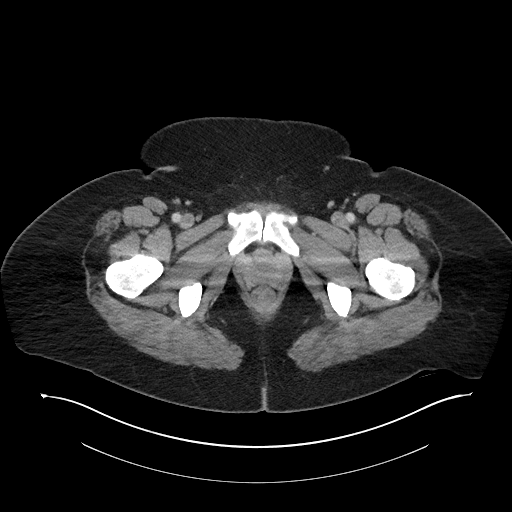
[im 20/90  soft-tissue]
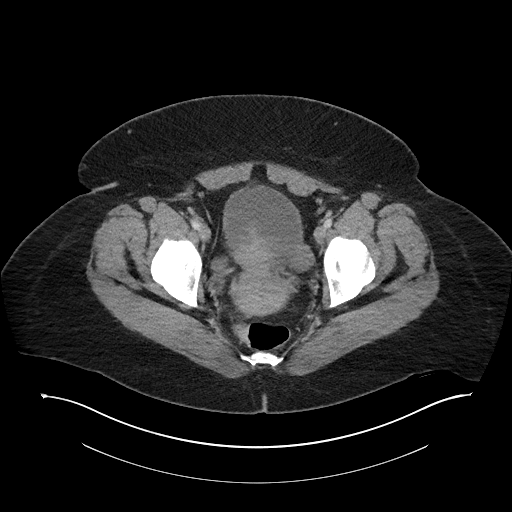
[im 25/90  soft-tissue]
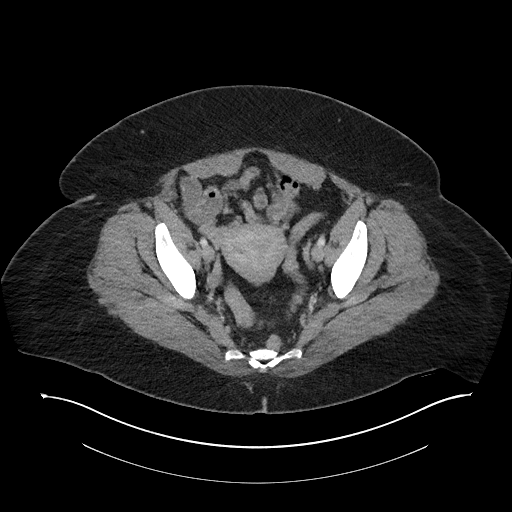
[im 30/90  soft-tissue]
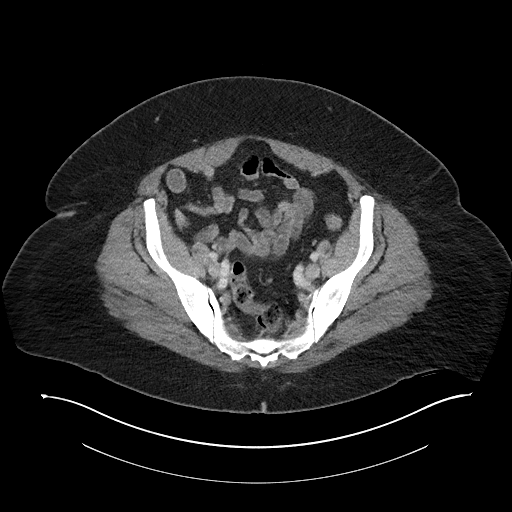
[im 40/90  soft-tissue]
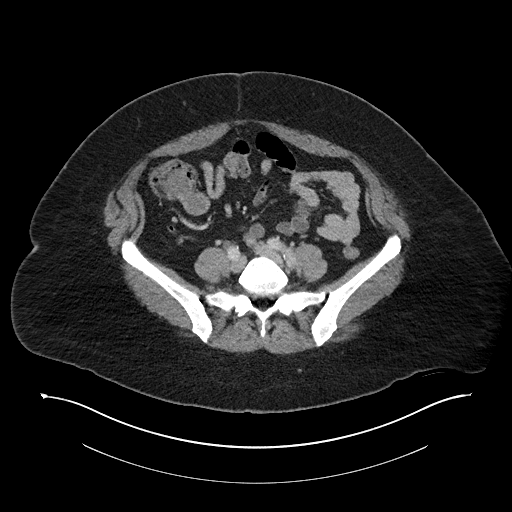
[im 45/90  soft-tissue]
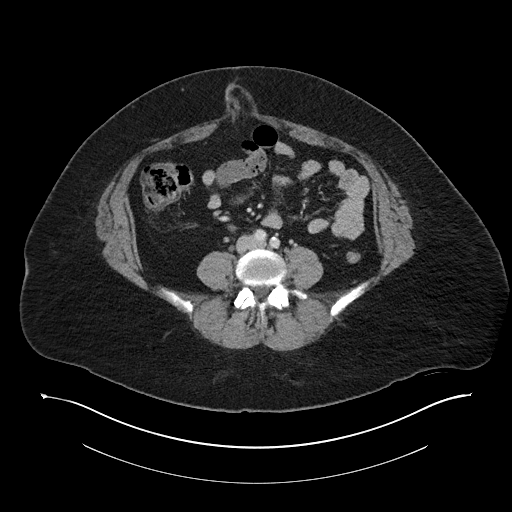
[im 50/90  soft-tissue]
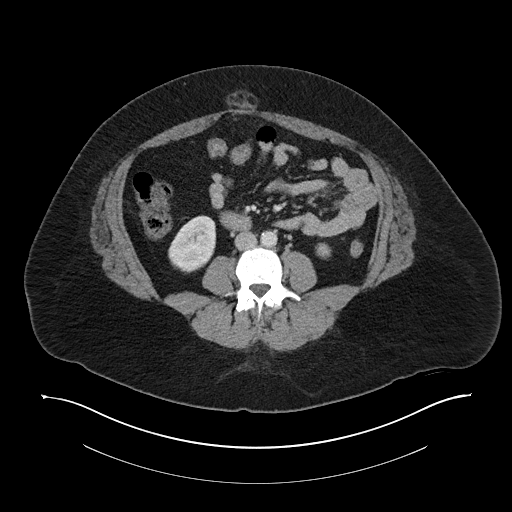
[im 60/90  soft-tissue]
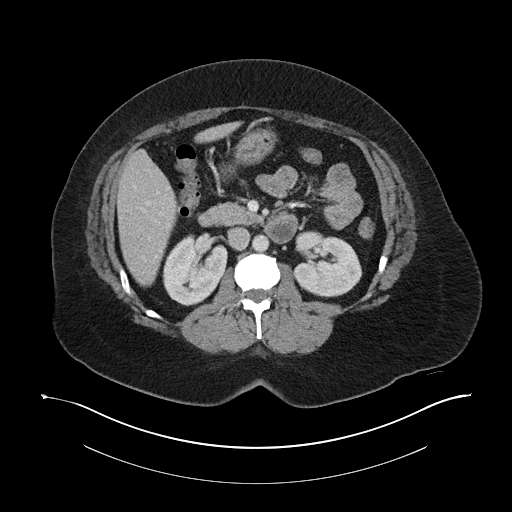
[im 60/90  bone]
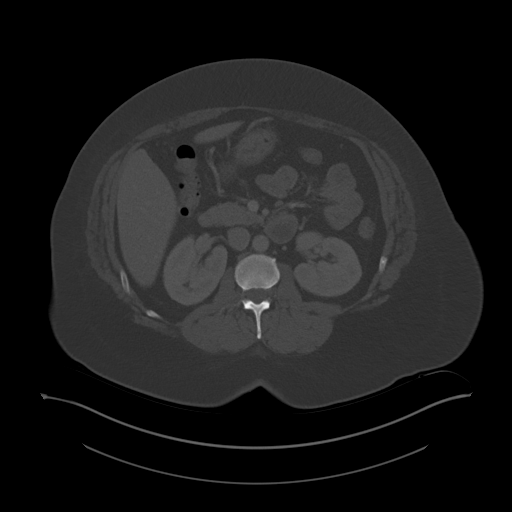
[im 65/90  soft-tissue]
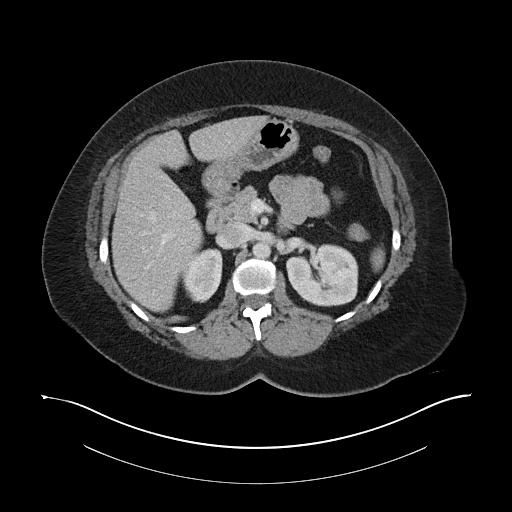
[im 70/90  soft-tissue]
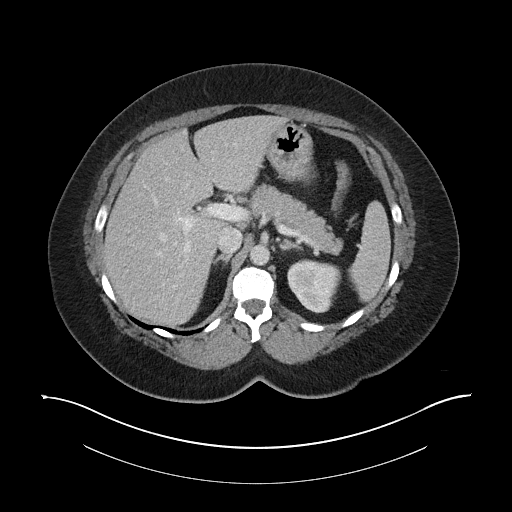
[im 80/90  soft-tissue]
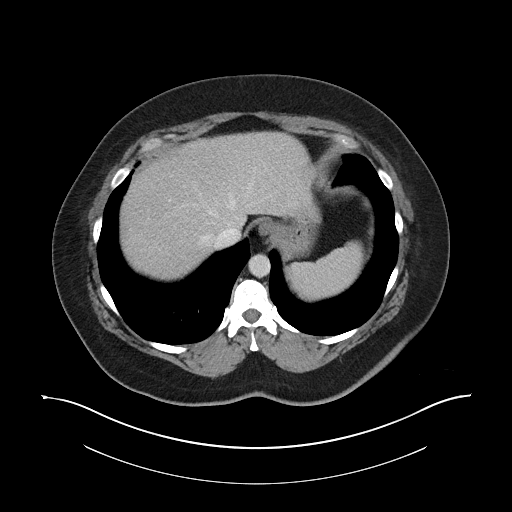
[im 85/90  soft-tissue]
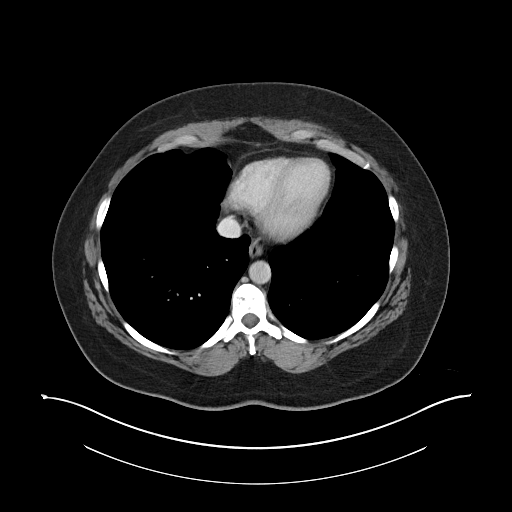

[Series 6: coronal soft tissue · coronal · 0.82mm/px · 3 of 92 slices shown]
[im 31/92  soft-tissue]
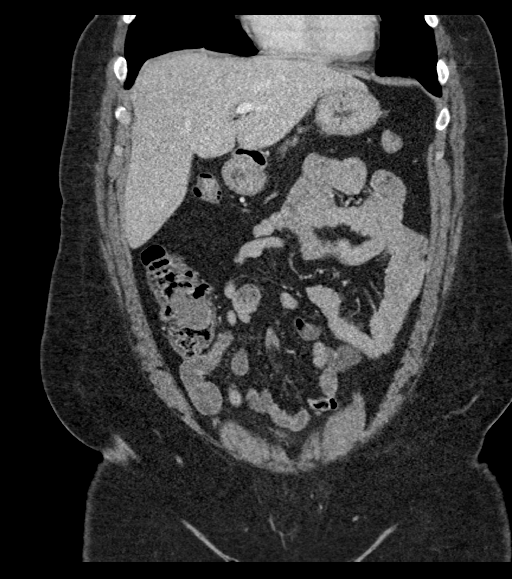
[im 41/92  soft-tissue]
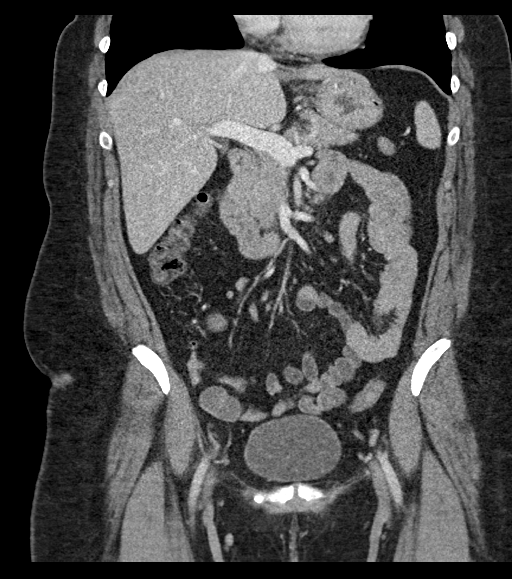
[im 51/92  soft-tissue]
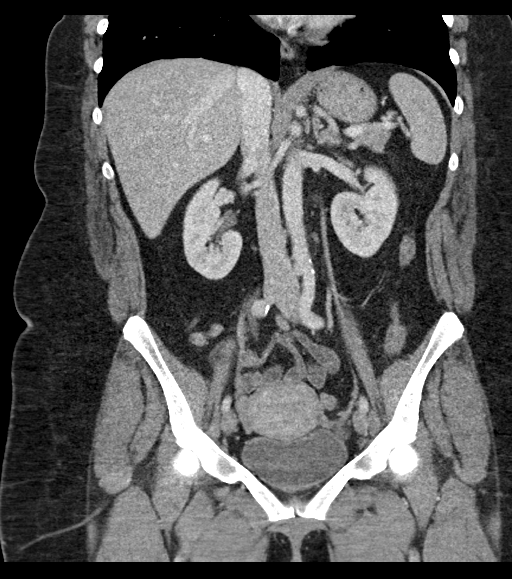

[16 of 46 positions shown; findings below may reference images not displayed]

FINDINGS: Lower chest: No acute abnormality.

Hepatobiliary: No focal liver abnormality is seen. Status post
cholecystectomy. No biliary dilatation.

Pancreas: Unremarkable. No pancreatic ductal dilatation or
surrounding inflammatory changes.

Spleen: Normal in size without significant abnormality.

Adrenals/Urinary Tract: Adrenal glands are unremarkable. Kidneys are
normal, without renal calculi, solid lesion, or hydronephrosis.
Bladder is unremarkable.

Stomach/Bowel: Stomach is within normal limits. Appendix appears
normal. No evidence of bowel wall thickening, distention, or
inflammatory changes.

Vascular/Lymphatic: Aortic atherosclerosis. No enlarged abdominal or
pelvic lymph nodes.

Reproductive: No mass or other significant abnormality.

Other: Redemonstrated broad-based fat containing periumbilical
hernia, measuring approximately 4.0 cm (series 3, image 44). No
evidence of bowel involvement or fat stranding. There is subtle fat
stranding of the omentum in the low abdomen inferior to the hernia,
a region measuring approximately 5.5 x 2.7 cm (series 3, image 56).
No abdominopelvic ascites.

Musculoskeletal: No acute or significant osseous findings.
IMPRESSION: 1. Redemonstrated broad-based fat containing periumbilical hernia,
measuring approximately 4.0 cm. No evidence of bowel involvement or
fat stranding at this time.
2. There is subtle fat stranding of the omentum in the low abdomen
inferior to the hernia, a region measuring approximately 5.5 x
cm. This finding is nonspecific and may be related to omental
inflammation or infarction.
3. Aortic Atherosclerosis ([id number].J), advanced for patient age.

## 2021-09-19 ENCOUNTER — Other Ambulatory Visit: Payer: Self-pay | Admitting: Nurse Practitioner

## 2021-09-19 DIAGNOSIS — Z1231 Encounter for screening mammogram for malignant neoplasm of breast: Secondary | ICD-10-CM

## 2022-07-12 ENCOUNTER — Other Ambulatory Visit: Payer: Self-pay

## 2022-07-12 ENCOUNTER — Observation Stay (HOSPITAL_COMMUNITY)
Admission: EM | Admit: 2022-07-12 | Discharge: 2022-07-13 | Discharge status: Against Medical Advice | Payer: Medicaid Other | Attending: Internal Medicine | Admitting: Internal Medicine

## 2022-07-12 ENCOUNTER — Encounter (HOSPITAL_COMMUNITY): Payer: Self-pay

## 2022-07-12 ENCOUNTER — Emergency Department (HOSPITAL_COMMUNITY): Payer: Medicaid Other

## 2022-07-12 DIAGNOSIS — R1031 Right lower quadrant pain: Secondary | ICD-10-CM | POA: Diagnosis not present

## 2022-07-12 DIAGNOSIS — I10 Essential (primary) hypertension: Secondary | ICD-10-CM | POA: Diagnosis not present

## 2022-07-12 DIAGNOSIS — K625 Hemorrhage of anus and rectum: Secondary | ICD-10-CM

## 2022-07-12 DIAGNOSIS — F410 Panic disorder [episodic paroxysmal anxiety] without agoraphobia: Secondary | ICD-10-CM | POA: Diagnosis not present

## 2022-07-12 DIAGNOSIS — E876 Hypokalemia: Secondary | ICD-10-CM | POA: Diagnosis present

## 2022-07-12 DIAGNOSIS — E785 Hyperlipidemia, unspecified: Secondary | ICD-10-CM | POA: Diagnosis not present

## 2022-07-12 DIAGNOSIS — E1169 Type 2 diabetes mellitus with other specified complication: Secondary | ICD-10-CM | POA: Diagnosis present

## 2022-07-12 DIAGNOSIS — F411 Generalized anxiety disorder: Secondary | ICD-10-CM | POA: Diagnosis not present

## 2022-07-12 DIAGNOSIS — Z79899 Other long term (current) drug therapy: Secondary | ICD-10-CM | POA: Insufficient documentation

## 2022-07-12 DIAGNOSIS — D259 Leiomyoma of uterus, unspecified: Secondary | ICD-10-CM | POA: Diagnosis not present

## 2022-07-12 DIAGNOSIS — K649 Unspecified hemorrhoids: Secondary | ICD-10-CM

## 2022-07-12 DIAGNOSIS — R112 Nausea with vomiting, unspecified: Secondary | ICD-10-CM | POA: Diagnosis not present

## 2022-07-12 DIAGNOSIS — E119 Type 2 diabetes mellitus without complications: Secondary | ICD-10-CM | POA: Diagnosis not present

## 2022-07-12 DIAGNOSIS — I159 Secondary hypertension, unspecified: Secondary | ICD-10-CM | POA: Diagnosis not present

## 2022-07-12 DIAGNOSIS — K640 First degree hemorrhoids: Secondary | ICD-10-CM | POA: Diagnosis not present

## 2022-07-12 DIAGNOSIS — K922 Gastrointestinal hemorrhage, unspecified: Secondary | ICD-10-CM | POA: Diagnosis not present

## 2022-07-12 DIAGNOSIS — R197 Diarrhea, unspecified: Secondary | ICD-10-CM | POA: Diagnosis not present

## 2022-07-12 LAB — CBC WITH DIFFERENTIAL/PLATELET
Abs Immature Granulocytes: 0.06 10*3/uL (ref 0.00–0.07)
Basophils Absolute: 0.1 10*3/uL (ref 0.0–0.1)
Basophils Relative: 0 %
Eosinophils Absolute: 0.2 10*3/uL (ref 0.0–0.5)
Eosinophils Relative: 2 %
HCT: 33.1 % — ABNORMAL LOW (ref 36.0–46.0)
Hemoglobin: 10.2 g/dL — ABNORMAL LOW (ref 12.0–15.0)
Immature Granulocytes: 0 %
Lymphocytes Relative: 14 %
Lymphs Abs: 2.2 10*3/uL (ref 0.7–4.0)
MCH: 24.6 pg — ABNORMAL LOW (ref 26.0–34.0)
MCHC: 30.8 g/dL (ref 30.0–36.0)
MCV: 80 fL (ref 80.0–100.0)
Monocytes Absolute: 0.8 10*3/uL (ref 0.1–1.0)
Monocytes Relative: 5 %
Neutro Abs: 12.1 10*3/uL — ABNORMAL HIGH (ref 1.7–7.7)
Neutrophils Relative %: 79 %
Platelets: 439 10*3/uL — ABNORMAL HIGH (ref 150–400)
RBC: 4.14 MIL/uL (ref 3.87–5.11)
RDW: 16.9 % — ABNORMAL HIGH (ref 11.5–15.5)
WBC: 15.4 10*3/uL — ABNORMAL HIGH (ref 4.0–10.5)
nRBC: 0 % (ref 0.0–0.2)

## 2022-07-12 LAB — COMPREHENSIVE METABOLIC PANEL
ALT: 16 U/L (ref 0–44)
AST: 26 U/L (ref 15–41)
Albumin: 3.6 g/dL (ref 3.5–5.0)
Alkaline Phosphatase: 83 U/L (ref 38–126)
Anion gap: 15 (ref 5–15)
BUN: 6 mg/dL (ref 6–20)
CO2: 24 mmol/L (ref 22–32)
Calcium: 9.1 mg/dL (ref 8.9–10.3)
Chloride: 99 mmol/L (ref 98–111)
Creatinine, Ser: 0.83 mg/dL (ref 0.44–1.00)
GFR, Estimated: >60 mL/min (ref >60)
Glucose, Bld: 148 mg/dL — ABNORMAL HIGH (ref 70–99)
Potassium: 3.3 mmol/L — ABNORMAL LOW (ref 3.5–5.1)
Sodium: 138 mmol/L (ref 135–145)
Total Bilirubin: 0.5 mg/dL (ref 0.3–1.2)
Total Protein: 6.9 g/dL (ref 6.5–8.1)

## 2022-07-12 LAB — HCG, SERUM, QUALITATIVE: Preg, Serum: NEGATIVE

## 2022-07-12 LAB — LIPASE, BLOOD: Lipase: 36 U/L (ref 11–51)

## 2022-07-12 MED ORDER — QUETIAPINE FUMARATE 25 MG PO TABS: 50.0000 mg | ORAL_TABLET | ORAL | Status: DC

## 2022-07-12 MED ORDER — HYDROMORPHONE HCL 1 MG/ML IJ SOLN: 0.5000 mg | INTRAMUSCULAR | Status: DC | PRN

## 2022-07-12 MED ORDER — HYDROXYZINE HCL 25 MG PO TABS: 25.0000 mg | ORAL_TABLET | Freq: Three times a day (TID) | ORAL | Status: DC | PRN

## 2022-07-12 MED ORDER — LACTATED RINGERS IV SOLN: INTRAVENOUS | Status: DC

## 2022-07-12 MED ORDER — LISINOPRIL 20 MG PO TABS: 40.0000 mg | ORAL_TABLET | Freq: Every day | ORAL | Status: DC

## 2022-07-12 MED ORDER — PANTOPRAZOLE SODIUM 40 MG IV SOLR
40.0000 mg | Freq: Once | INTRAVENOUS | Status: AC
  Administered 2022-07-12: 40 mg via INTRAVENOUS
  Filled 2022-07-12: qty 10

## 2022-07-12 MED ORDER — DROPERIDOL 2.5 MG/ML IJ SOLN
2.5000 mg | Freq: Once | INTRAMUSCULAR | Status: AC
  Administered 2022-07-12: 2.5 mg via INTRAVENOUS
  Filled 2022-07-12: qty 2

## 2022-07-12 MED ORDER — ATORVASTATIN CALCIUM 10 MG PO TABS: 20.0000 mg | ORAL_TABLET | Freq: Every day | ORAL | Status: DC

## 2022-07-12 MED ORDER — ONDANSETRON HCL 4 MG/2ML IJ SOLN
4.0000 mg | Freq: Once | INTRAMUSCULAR | Status: AC
  Administered 2022-07-12: 4 mg via INTRAVENOUS
  Filled 2022-07-12: qty 2

## 2022-07-12 MED ORDER — PROCHLORPERAZINE EDISYLATE 10 MG/2ML IJ SOLN: 5.0000 mg | INTRAMUSCULAR | Status: DC | PRN

## 2022-07-12 MED ORDER — ACETAMINOPHEN 650 MG RE SUPP: 650.0000 mg | Freq: Four times a day (QID) | RECTAL | Status: DC | PRN

## 2022-07-12 MED ORDER — ONDANSETRON HCL 4 MG/2ML IJ SOLN: 4.0000 mg | Freq: Four times a day (QID) | INTRAMUSCULAR | Status: DC | PRN

## 2022-07-12 MED ORDER — SODIUM CHLORIDE 0.9 % IV BOLUS
1000.0000 mL | Freq: Once | INTRAVENOUS | Status: AC
  Administered 2022-07-12: 1000 mL via INTRAVENOUS

## 2022-07-12 MED ORDER — POTASSIUM CHLORIDE 10 MEQ/100ML IV SOLN
10.0000 meq | Freq: Once | INTRAVENOUS | Status: AC
  Administered 2022-07-12: 10 meq via INTRAVENOUS
  Filled 2022-07-12: qty 100

## 2022-07-12 MED ORDER — SODIUM CHLORIDE 0.9% FLUSH: 3.0000 mL | Freq: Two times a day (BID) | INTRAVENOUS | Status: DC

## 2022-07-12 MED ORDER — POTASSIUM CHLORIDE CRYS ER 20 MEQ PO TBCR
40.0000 meq | EXTENDED_RELEASE_TABLET | Freq: Once | ORAL | Status: AC
  Administered 2022-07-12: 40 meq via ORAL
  Filled 2022-07-12: qty 2

## 2022-07-12 MED ORDER — ONDANSETRON HCL 4 MG PO TABS: 4.0000 mg | ORAL_TABLET | ORAL | 0 refills | Status: DC | PRN

## 2022-07-12 MED ORDER — AMLODIPINE BESYLATE 5 MG PO TABS: 10.0000 mg | ORAL_TABLET | Freq: Every day | ORAL | Status: DC

## 2022-07-12 MED ORDER — PANTOPRAZOLE SODIUM 20 MG PO TBEC: 20.0000 mg | DELAYED_RELEASE_TABLET | Freq: Every day | ORAL | 0 refills | Status: DC

## 2022-07-12 MED ORDER — ACETAMINOPHEN 325 MG PO TABS: 650.0000 mg | ORAL_TABLET | Freq: Four times a day (QID) | ORAL | Status: DC | PRN

## 2022-07-12 MED ORDER — DICYCLOMINE HCL 20 MG PO TABS: 20.0000 mg | ORAL_TABLET | Freq: Two times a day (BID) | ORAL | 0 refills | Status: DC

## 2022-07-12 MED ORDER — HYDROMORPHONE HCL 1 MG/ML IJ SOLN
1.0000 mg | Freq: Once | INTRAMUSCULAR | Status: AC
  Administered 2022-07-12: 1 mg via INTRAVENOUS
  Filled 2022-07-12: qty 1

## 2022-07-12 MED ORDER — IOHEXOL 350 MG/ML SOLN
75.0000 mL | Freq: Once | INTRAVENOUS | Status: AC | PRN
  Administered 2022-07-12: 75 mL via INTRAVENOUS

## 2022-07-12 NOTE — ED Provider Notes (Signed)
Care of patient received from prior provider at 11:42 PM, please see their note for complete H/P and care plan.  Received handoff per ED course.  Clinical Course as of 07/12/22 2342  Wynelle Link Jul 12, 2022  1942 No recent travel or sick contacts, no thinners, has some LLQ pain with defecation. Similar symptoms in the past that did resolve spontaneously. Nausea w/o vomiting, some epig cramping/burning pain as well. No urination changes. Does not follow w/ GI [SG]  2028 WBC(!): 15.4 Chronically elevated [SG]  2028 Hemoglobin(!): 10.2 Similar to baseline [SG]  2028 Potassium(!): 3.3 will replace orally [SG]  2044 Pt had bowel movement, diarrhea, bright red blood mixed with stool.  [SG]  2116 CTAP was stable, labs stable. Pain persisting, now having worsening nausea and vomiting. Will give dilaudid/zofran and re-check. Abd is not peritoneal.  [SG]  2122 Hemoccult was positive.  Internal and external hemorrhoid palpated on exam.  No frank bleeding appreciated.  Still having some abdominal pain.  Her labs are stable, she has leukocytosis that she has had chronically, possibly reactive in setting of vomiting.  Afebrile, does not appear to be septic.  She does not follow with GI currently.  Will sign outpatient incoming EDP pending recheck after analgesics and antiemetic, she is hemodynamically stable would favor discharge with outpatient evaluation by GI [SG]  2129 Stable. Abdominal pain, being treated Labs at baseline. Reassess and dispo [CC]    Clinical Course User Index [CC] Glyn Ade, MD [SG] Sloan Leiter, DO    Reassessment: Reassessed at bedside.  Still unable to tolerate any p.o. intake.  Shared medical decision making with patient.  She has been unable to keep down even potassium supplementation and patient has refused comfortable discharge this time. Peer consult hospitalist agreed with observation.  Disposition:   Based on the above findings, I believe this patient is stable for  admission.    Patient/family educated about specific findings on our evaluation and explained exact reasons for admission.  Patient/family educated about clinical situation and time was allowed to answer questions.   Admission team communicated with and agreed with need for admission. Patient admitted. Patient ready to move at this time.     Emergency Department Medication Summary:   Medications  potassium chloride 10 mEq in 100 mL IVPB (10 mEq Intravenous New Bag/Given 07/12/22 2322)  sodium chloride flush (NS) 0.9 % injection 3 mL (3 mLs Intravenous Not Given 07/12/22 2336)  lactated ringers infusion (has no administration in time range)  acetaminophen (TYLENOL) tablet 650 mg (has no administration in time range)    Or  acetaminophen (TYLENOL) suppository 650 mg (has no administration in time range)  HYDROmorphone (DILAUDID) injection 0.5 mg (has no administration in time range)  prochlorperazine (COMPAZINE) injection 5 mg (has no administration in time range)  sodium chloride 0.9 % bolus 1,000 mL (0 mLs Intravenous Stopped 07/12/22 2149)  ondansetron (ZOFRAN) injection 4 mg (4 mg Intravenous Given 07/12/22 1955)  pantoprazole (PROTONIX) injection 40 mg (40 mg Intravenous Given 07/12/22 1955)  potassium chloride SA (KLOR-CON M) CR tablet 40 mEq (40 mEq Oral Given 07/12/22 2205)  iohexol (OMNIPAQUE) 350 MG/ML injection 75 mL (75 mLs Intravenous Contrast Given 07/12/22 2048)  ondansetron (ZOFRAN) injection 4 mg (4 mg Intravenous Given 07/12/22 2117)  HYDROmorphone (DILAUDID) injection 1 mg (1 mg Intravenous Given 07/12/22 2117)  droperidol (INAPSINE) 2.5 MG/ML injection 2.5 mg (2.5 mg Intravenous Given 07/12/22 2321)           Glyn Ade,  MD 07/12/22 2342

## 2022-07-12 NOTE — ED Triage Notes (Signed)
Pt came in via POV d/t abd cramping & diarrhea with blood clots in it. A/Ox4, rates pain 9/10, feels nauseous, denies lightheaded or dizziness. Does NOT take thinners.

## 2022-07-12 NOTE — H&P (Incomplete)
History and Physical    Cassandra Marandola ZOX:[id number] DOB: Jun 24, 1975 DOA: 07/12/2022  PCP: Medicine, [facility]   Patient coming from: ***  Chief Complaint: ***  HPI: Cassandra Greer is a pleasant 47 y.o. female with medical history significant for ***   ED Course: Upon arrival to the ED, patient is found to be ***  Review of Systems:  All other systems reviewed and apart from HPI, are negative.  Past Medical History:  Diagnosis Date  . Chest pain   . Colitis   . Hypertension   . Marijuana abuse   . Morbid obesity (HCC)   . Panic attacks   . Type 2 diabetes mellitus with hyperlipidemia (HCC) 12/22/2016    Past Surgical History:  Procedure Laterality Date  . CHOLECYSTECTOMY N/A 02/21/2018   Procedure: LAPAROSCOPIC CHOLECYSTECTOMY WITH INTRAOPERATIVE CHOLANGIOGRAM;  Surgeon: Manus Rudd, MD;  Location: [facility] OR;  Service: General;  Laterality: N/A;  . INSERTION OF MESH N/A 11/28/2019   Procedure: INSERTION OF MESH;  Surgeon: Harriette Bouillon, MD;  Location: [facility] OR;  Service: General;  Laterality: N/A;  . TUBAL LIGATION    . VENTRAL HERNIA REPAIR N/A 11/28/2019   Procedure: OPEN VENTRAL HERNIA REPAIR;  Surgeon: Harriette Bouillon, MD;  Location: [facility] OR;  Service: General;  Laterality: N/A;    Social History:   reports that she has never smoked. She has never used smokeless tobacco. She reports current drug use. Drug: Marijuana. She reports that she does not drink alcohol.  Allergies  Allergen Reactions  . Morphine And Codeine Itching    Family History  Problem Relation Age of Onset  . Hypertension Other   . Diabetes Mellitus II Other   . Bipolar disorder Mother   . Other Father        unaware of his PMH     Prior to Admission medications   Medication Sig Start Date End Date Taking? Authorizing Provider  dicyclomine (BENTYL) 20 MG tablet Take 1 tablet (20 mg total) by mouth 2 (two) times daily for 7 days. 07/12/22 07/19/22 Yes Tanda Rockers A, DO   ondansetron (ZOFRAN) 4 MG tablet Take 1 tablet (4 mg total) by mouth every 4 (four) hours as needed for nausea or vomiting. 07/12/22  Yes Tanda Rockers A, DO  pantoprazole (PROTONIX) 20 MG tablet Take 1 tablet (20 mg total) by mouth daily for 14 days. 07/12/22 07/26/22 Yes Tanda Rockers A, DO  amLODipine (NORVASC) 5 MG tablet Take 10 mg by mouth daily.    [provider]  atorvastatin (LIPITOR) 20 MG tablet Take 20 mg by mouth daily.    [provider]  famotidine (PEPCID) 20 MG tablet Take 1 tablet (20 mg total) by mouth 2 (two) times daily. 10/02/18 11/28/19  Willy Eddy, MD  hydrochlorothiazide (HYDRODIURIL) 25 MG tablet Take 0.5 tablets (12.5 mg total) by mouth daily. Patient taking differently: Take 6.25 mg by mouth daily.  03/17/18   Shon Hale, MD  hydrOXYzine (ATARAX/VISTARIL) 25 MG tablet Take 1 tablet (25 mg total) by mouth 3 (three) times daily as needed for anxiety. 03/17/18   Shon Hale, MD  ibuprofen (ADVIL) 800 MG tablet Take 1 tablet (800 mg total) by mouth every 8 (eight) hours as needed. 11/29/19   Cornett, Maisie Fus, MD  lisinopril (ZESTRIL) 40 MG tablet Take 40 mg by mouth daily.     [provider]  medroxyPROGESTERone (PROVERA) 5 MG tablet Take 1 tablet (5 mg total) by mouth daily. Alternate 14 days  taking pill, then 14 days off. Do this monthly 11/16/19 11/15/20  Malachy Chamber, MD  polyethylene glycol [facility] / Ethelene Hal) packet Take 17 g by mouth daily as needed for mild constipation or moderate constipation. 03/17/18   Shon Hale, MD  QUEtiapine (SEROQUEL) 50 MG tablet Take 50-100 mg by mouth See admin instructions. 50mg  in am and 100mg  at night    [provider]    Physical Exam: Vitals:   07/12/22 2242 07/12/22 2242 07/12/22 2245 07/12/22 2330  BP:  124/82 122/84 (!) 131/90  Pulse:  77 76 95  Resp:  16 15 17   Temp: (!) 97.2 F (36.2 C)     TempSrc: Oral     SpO2:  94% 94% 94%     Constitutional: NAD, calm  Eyes:  PERTLA, lids and conjunctivae normal ENMT: Mucous membranes are moist. Posterior pharynx clear of any exudate or lesions.   Neck: supple, no masses  Respiratory: clear to auscultation bilaterally, no wheezing, no crackles. No accessory muscle use.  Cardiovascular: S1 & S2 heard, regular rate and rhythm. No extremity edema. No significant JVD. Abdomen: No distension, no tenderness, soft. Bowel sounds active.  Musculoskeletal: no clubbing / cyanosis. No joint deformity upper and lower extremities.   Skin: no significant rashes, lesions, ulcers. Warm, dry, well-perfused. Neurologic: CN 2-12 grossly intact. Sensation intact, DTR normal. Strength 5/5 in all 4 limbs. Alert and oriented.  Psychiatric: Pleasant. Cooperative.    Labs and Imaging on Admission: I have personally reviewed following labs and imaging studies  CBC: Recent Labs  Lab 07/12/22 1947  WBC 15.4*  NEUTROABS 12.1*  HGB 10.2*  HCT 33.1*  MCV 80.0  PLT 439*   Basic Metabolic Panel: Recent Labs  Lab 07/12/22 1947  NA 138  K 3.3*  CL 99  CO2 24  GLUCOSE 148*  BUN 6  CREATININE 0.83  CALCIUM 9.1   GFR: CrCl cannot be calculated (Unknown ideal weight.). Liver Function Tests: Recent Labs  Lab 07/12/22 1947  AST 26  ALT 16  ALKPHOS 83  BILITOT 0.5  PROT 6.9  ALBUMIN 3.6   Recent Labs  Lab 07/12/22 1947  LIPASE 36   No results for input(s): "AMMONIA" in the last 168 hours. Coagulation Profile: No results for input(s): "INR", "PROTIME" in the last 168 hours. Cardiac Enzymes: No results for input(s): "CKTOTAL", "CKMB", "CKMBINDEX", "TROPONINI" in the last 168 hours. BNP (last 3 results) No results for input(s): "PROBNP" in the last 8760 hours. HbA1C: No results for input(s): "HGBA1C" in the last 72 hours. CBG: No results for input(s): "GLUCAP" in the last 168 hours. Lipid Profile: No results for input(s): "CHOL", "HDL", "LDLCALC", "TRIG", "CHOLHDL", "LDLDIRECT" in the last 72 hours. Thyroid  Function Tests: No results for input(s): "TSH", "T4TOTAL", "FREET4", "T3FREE", "THYROIDAB" in the last 72 hours. Anemia Panel: No results for input(s): "VITAMINB12", "FOLATE", "FERRITIN", "TIBC", "IRON", "RETICCTPCT" in the last 72 hours. Urine analysis:    Component Value Date/Time   COLORURINE YELLOW 11/27/2019 0947   APPEARANCEUR HAZY (A) 11/27/2019 0947   LABSPEC 1.004 (L) 11/27/2019 0947   PHURINE 8.0 11/27/2019 0947   GLUCOSEU NEGATIVE 11/27/2019 0947   HGBUR LARGE (A) 11/27/2019 0947   BILIRUBINUR NEGATIVE 11/27/2019 0947   KETONESUR NEGATIVE 11/27/2019 0947   PROTEINUR 30 (A) 11/27/2019 0947   UROBILINOGEN 1.0 01/09/2014 1806   NITRITE NEGATIVE 11/27/2019 0947   LEUKOCYTESUR TRACE (A) 11/27/2019 0947   Sepsis Labs: @LABRCNTIP (procalcitonin:4,lacticidven:4) )No results found for this or any previous visit (from  the past 240 hour(s)).   Radiological Exams on Admission: CT ABDOMEN PELVIS W CONTRAST  Result Date: 07/12/2022 CLINICAL DATA:  Right lower quadrant pain, bright red blood per rectum EXAM: CT ABDOMEN AND PELVIS WITH CONTRAST TECHNIQUE: Multidetector CT imaging of the abdomen and pelvis was performed using the standard protocol following bolus administration of intravenous contrast. RADIATION DOSE REDUCTION: This exam was performed according to the departmental dose-optimization program which includes automated exposure control, adjustment of the mA and/or kV according to patient size and/or use of iterative reconstruction technique. CONTRAST:  75mL OMNIPAQUE IOHEXOL 350 MG/ML SOLN COMPARISON:  11/27/2019 FINDINGS: Lower chest: No acute pleural or parenchymal lung disease. Hepatobiliary: No focal liver abnormality is seen. Status post cholecystectomy. No biliary dilatation. Pancreas: Unremarkable. No pancreatic ductal dilatation or surrounding inflammatory changes. Spleen: Normal in size without focal abnormality. Adrenals/Urinary Tract: Adrenal glands are unremarkable. Kidneys  are normal, without renal calculi, focal lesion, or hydronephrosis. Bladder is unremarkable. Stomach/Bowel: No bowel obstruction or ileus. Normal appendix right lower quadrant. No bowel wall thickening or inflammatory change. Vascular/Lymphatic: Aortic atherosclerosis. No enlarged abdominal or pelvic lymph nodes. Reproductive: Cystic areas within the cervix likely reflect nabothian cyst. 3.5 cm subserosal fibroid off the ventral aspect of the uterus. No adnexal masses. Other: No free fluid or free intraperitoneal gas. No abdominal wall hernia. Prior umbilical hernia repair. Musculoskeletal: No acute or destructive bony abnormalities. Reconstructed images demonstrate no additional findings. IMPRESSION: 1. No acute intra-abdominal or intrapelvic process. Normal appendix. 2. Fibroid uterus. 3.  Aortic Atherosclerosis (ICD10-I70.0). Electronically Signed   By: Sharlet Salina M.D.   On: 07/12/2022 21:02    EKG: Independently reviewed. ***  Assessment/Plan Principal Problem:   Intractable nausea and vomiting Active Problems:   HTN (hypertension)   Hypokalemia   Type 2 diabetes mellitus with hyperlipidemia (HCC)   Generalized anxiety disorder with panic attacks   Lower GI bleed    1. Intractable N/V, bloody diarrhea  - Presents with non-bloody vomiting and diarrhea with bright red blood that began yesterday  - She is afebrile, chronic leukocytosis noted, no acute findings on CT  - Continues to vomit despite aggressive treatment in E -  -  -  -  -   2. Hypertension  - Continue Norvasc and lisinopril as tolerated, hold HCTZ in setting of hypovolemia    3. Anxiety  - Continue Seroquel and as-needed Atarax    4. Type II DM  - A1c was 6.0% in July 2023  - Hold glipizide for now, monitor CBGs, use low-intensity SSI if needed    5. Hypokalemia  - Replacing, will repeat chem panel in am    DVT prophylaxis: SCDs  Code Status: Full  Level of Care: Level of care: Telemetry Medical Family  Communication: none present  Disposition Plan:  Patient is from: home  Anticipated d/c is to: Home  Anticipated d/c date is: Possibly as early as 6/3 or 07/14/22  Patient currently: Pending stable H&H, tolerance of adequate oral intake  Consults called: None  Admission status: Observation     Briscoe Deutscher, MD [facility]  07/12/2022, 11:56 PM

## 2022-07-12 NOTE — Discharge Instructions (Addendum)
Please follow-up with gastroenterology for recheck of your rectal bleeding  Please return if bleeding worsens or pain worsens  Please follow-up with your PCP for recheck of your hemoglobin the next 3 days  If diarrhea persist beyond 7 days please follow-up PCP to provide stool sample  It was a pleasure caring for you today in the emergency department.  Please return to the emergency department for any worsening or worrisome symptoms.

## 2022-07-12 NOTE — ED Provider Notes (Signed)
[place] [facility]MENT AT [facility] Provider Note  CSN: [id number] Arrival date & time: 07/12/22 1851  Chief Complaint(s) Diarrhea with Blood clots and Nausea  HPI Cassandra Greer is a 47 y.o. female with past medical history as below, significant for colitis, obesity, THC abuse, type II DM, hypertension, GAD, panic disorder who presents to the ED with complaint of abdominal pain, BRBPR, diarrhea.  Patient reports epigastric and left lower quadrant abdominal pain over the past 24 hours, she has had 2 episodes of loose stools that she noted bright red blood mixed with her stool.  She has had nausea without any vomiting.  She smokes THC daily but no daily alcohol use or daily NSAID use.  No tobacco use.  Similar episode in the past attributed to colitis.  No blood thinner use.  No recent travel or sick contacts, no sufficient p.o. intake.  Does not follow with gastroenterology.  No fevers or rashes  Past Medical History Past Medical History:  Diagnosis Date   Chest pain    Colitis    Hypertension    Marijuana abuse    Morbid obesity (HCC)    Panic attacks    Type 2 diabetes mellitus with hyperlipidemia (HCC) 12/22/2016   Patient Active Problem List   Diagnosis Date Noted   Incisional hernia 11/28/2019   Abdominal pain 03/16/2018   Hypomagnesemia 03/16/2018   Hypertensive urgency 03/15/2018   Acute cholecystitis due to biliary calculus 02/20/2018   Generalized anxiety disorder with panic attacks 12/24/2016   Type 2 diabetes mellitus with hyperlipidemia (HCC) 12/22/2016   Hyperlipidemia 12/22/2016   Chest pain 12/21/2016   Accelerated hypertension 12/21/2016   Hyperglycemia 12/21/2016   Epigastric abdominal pain 12/21/2016   Fatty liver disease, nonalcoholic 12/21/2016   Thrombocytosis 12/21/2016   Panic disorder (episodic paroxysmal anxiety) 12/21/2016   HTN (hypertension) 03/08/2013   Hypokalemia 03/08/2013   Headache(784.0) 03/08/2013   Colitis  03/05/2013   Leukocytosis 03/05/2013   Sinusitis 03/05/2013   Home Medication(s) Prior to Admission medications   Medication Sig Start Date End Date Taking? Authorizing Provider  dicyclomine (BENTYL) 20 MG tablet Take 1 tablet (20 mg total) by mouth 2 (two) times daily for 7 days. 07/12/22 07/19/22 Yes Tanda Rockers A, DO  ondansetron (ZOFRAN) 4 MG tablet Take 1 tablet (4 mg total) by mouth every 4 (four) hours as needed for nausea or vomiting. 07/12/22  Yes Tanda Rockers A, DO  pantoprazole (PROTONIX) 20 MG tablet Take 1 tablet (20 mg total) by mouth daily for 14 days. 07/12/22 07/26/22 Yes Tanda Rockers A, DO  amLODipine (NORVASC) 5 MG tablet Take 10 mg by mouth daily.    [provider]  atorvastatin (LIPITOR) 20 MG tablet Take 20 mg by mouth daily.    [provider]  famotidine (PEPCID) 20 MG tablet Take 1 tablet (20 mg total) by mouth 2 (two) times daily. 10/02/18 11/28/19  Willy Eddy, MD  hydrochlorothiazide (HYDRODIURIL) 25 MG tablet Take 0.5 tablets (12.5 mg total) by mouth daily. Patient taking differently: Take 6.25 mg by mouth daily.  03/17/18   Shon Hale, MD  hydrOXYzine (ATARAX/VISTARIL) 25 MG tablet Take 1 tablet (25 mg total) by mouth 3 (three) times daily as needed for anxiety. 03/17/18   Shon Hale, MD  ibuprofen (ADVIL) 800 MG tablet Take 1 tablet (800 mg total) by mouth every 8 (eight) hours as needed. 11/29/19   Cornett, Maisie Fus, MD  lisinopril (ZESTRIL) 40 MG tablet Take 40 mg by mouth daily.  [provider]  medroxyPROGESTERone (PROVERA) 5 MG tablet Take 1 tablet (5 mg total) by mouth daily. Alternate 14 days taking pill, then 14 days off. Do this monthly 11/16/19 11/15/20  Malachy Chamber, MD  polyethylene glycol [facility] Ethelene Hal) packet Take 17 g by mouth daily as needed for mild constipation or moderate constipation. 03/17/18   Shon Hale, MD  QUEtiapine (SEROQUEL) 50 MG tablet Take 50-100 mg by mouth See admin instructions. 50mg   in am and 100mg  at night    [provider]                                                                                                                                    Past Surgical History Past Surgical History:  Procedure Laterality Date   CHOLECYSTECTOMY N/A 02/21/2018   Procedure: LAPAROSCOPIC CHOLECYSTECTOMY WITH INTRAOPERATIVE CHOLANGIOGRAM;  Surgeon: Manus Rudd, MD;  Location: [facility] OR;  Service: General;  Laterality: N/A;   INSERTION OF MESH N/A 11/28/2019   Procedure: INSERTION OF MESH;  Surgeon: Harriette Bouillon, MD;  Location: [facility] OR;  Service: General;  Laterality: N/A;   TUBAL LIGATION     VENTRAL HERNIA REPAIR N/A 11/28/2019   Procedure: OPEN VENTRAL HERNIA REPAIR;  Surgeon: Harriette Bouillon, MD;  Location: [facility] OR;  Service: General;  Laterality: N/A;   Family History Family History  Problem Relation Age of Onset   Hypertension Other    Diabetes Mellitus II Other    Bipolar disorder Mother    Other Father        unaware of his PMH    Social History Social History   Tobacco Use   Smoking status: Never   Smokeless tobacco: Never  Vaping Use   Vaping Use: Never used  Substance Use Topics   Alcohol use: No   Drug use: Yes    Types: Marijuana    Comment: every day   Allergies Morphine and codeine  Review of Systems Review of Systems  Constitutional:  Negative for activity change and fever.  HENT:  Negative for facial swelling and trouble swallowing.   Eyes:  Negative for discharge and redness.  Respiratory:  Negative for cough and shortness of breath.   Cardiovascular:  Negative for chest pain and palpitations.  Gastrointestinal:  Positive for abdominal pain, blood in stool, diarrhea and nausea.  Genitourinary:  Negative for dysuria and flank pain.  Musculoskeletal:  Negative for back pain and gait problem.  Skin:  Negative for pallor and rash.  Neurological:  Negative for syncope and headaches.    Physical Exam Vital Signs  I have reviewed  the triage vital signs BP (!) 141/115 (BP Location: Right Arm)   Pulse (!) 118   Temp 98.9 F (37.2 C) (Oral)   Resp 15   SpO2 95%  Physical Exam Vitals and nursing note reviewed. Exam conducted with a chaperone present.  Constitutional:      General: She  is not in acute distress.    Appearance: Normal appearance. She is obese.  HENT:     Head: Normocephalic and atraumatic.     Right Ear: External ear normal.     Left Ear: External ear normal.     Nose: Nose normal.     Mouth/Throat:     Mouth: Mucous membranes are moist.  Eyes:     General: No scleral icterus.       Right eye: No discharge.        Left eye: No discharge.  Cardiovascular:     Rate and Rhythm: Normal rate and regular rhythm.     Pulses: Normal pulses.     Heart sounds: Normal heart sounds.  Pulmonary:     Effort: Pulmonary effort is normal. No respiratory distress.     Breath sounds: Normal breath sounds.  Abdominal:     General: Abdomen is flat.     Palpations: Abdomen is soft.     Tenderness: There is abdominal tenderness in the epigastric area and left lower quadrant.       Comments: Nonperitoneal  Genitourinary:    Comments: Internal and external hemorrhoid noted on exam.  No frank bleeding.  No melena on exam.  Brown stool on exam with few spots of blood.  No obvious fissure Musculoskeletal:        General: Normal range of motion.     Right lower leg: No edema.     Left lower leg: No edema.  Skin:    General: Skin is warm and dry.     Capillary Refill: Capillary refill takes less than 2 seconds.  Neurological:     Mental Status: She is alert and oriented to person, place, and time.     GCS: GCS eye subscore is 4. GCS verbal subscore is 5. GCS motor subscore is 6.  Psychiatric:        Mood and Affect: Mood normal.        Behavior: Behavior normal.     ED Results and Treatments Labs (all labs ordered are listed, but only abnormal results are displayed) Labs Reviewed  CBC WITH  DIFFERENTIAL/PLATELET - Abnormal; Notable for the following components:      Result Value   WBC 15.4 (*)    Hemoglobin 10.2 (*)    HCT 33.1 (*)    MCH 24.6 (*)    RDW 16.9 (*)    Platelets 439 (*)    Neutro Abs 12.1 (*)    All other components within normal limits  COMPREHENSIVE METABOLIC PANEL - Abnormal; Notable for the following components:   Potassium 3.3 (*)    Glucose, Bld 148 (*)    All other components within normal limits  LIPASE, BLOOD  HCG, SERUM, QUALITATIVE  URINALYSIS, ROUTINE W REFLEX MICROSCOPIC  POC OCCULT BLOOD, ED  Radiology CT ABDOMEN PELVIS W CONTRAST  Result Date: 07/12/2022 CLINICAL DATA:  Right lower quadrant pain, bright red blood per rectum EXAM: CT ABDOMEN AND PELVIS WITH CONTRAST TECHNIQUE: Multidetector CT imaging of the abdomen and pelvis was performed using the standard protocol following bolus administration of intravenous contrast. RADIATION DOSE REDUCTION: This exam was performed according to the departmental dose-optimization program which includes automated exposure control, adjustment of the mA and/or kV according to patient size and/or use of iterative reconstruction technique. CONTRAST:  75mL OMNIPAQUE IOHEXOL 350 MG/ML SOLN COMPARISON:  11/27/2019 FINDINGS: Lower chest: No acute pleural or parenchymal lung disease. Hepatobiliary: No focal liver abnormality is seen. Status post cholecystectomy. No biliary dilatation. Pancreas: Unremarkable. No pancreatic ductal dilatation or surrounding inflammatory changes. Spleen: Normal in size without focal abnormality. Adrenals/Urinary Tract: Adrenal glands are unremarkable. Kidneys are normal, without renal calculi, focal lesion, or hydronephrosis. Bladder is unremarkable. Stomach/Bowel: No bowel obstruction or ileus. Normal appendix right lower quadrant. No bowel wall thickening or inflammatory  change. Vascular/Lymphatic: Aortic atherosclerosis. No enlarged abdominal or pelvic lymph nodes. Reproductive: Cystic areas within the cervix likely reflect nabothian cyst. 3.5 cm subserosal fibroid off the ventral aspect of the uterus. No adnexal masses. Other: No free fluid or free intraperitoneal gas. No abdominal wall hernia. Prior umbilical hernia repair. Musculoskeletal: No acute or destructive bony abnormalities. Reconstructed images demonstrate no additional findings. IMPRESSION: 1. No acute intra-abdominal or intrapelvic process. Normal appendix. 2. Fibroid uterus. 3.  Aortic Atherosclerosis (ICD10-I70.0). Electronically Signed   By: Sharlet Salina M.D.   On: 07/12/2022 21:02    Pertinent labs & imaging results that were available during my care of the patient were reviewed by me and considered in my medical decision making (see MDM for details).  Medications Ordered in ED Medications  potassium chloride SA (KLOR-CON M) CR tablet 40 mEq (has no administration in time range)  sodium chloride 0.9 % bolus 1,000 mL (1,000 mLs Intravenous New Bag/Given 07/12/22 1954)  ondansetron (ZOFRAN) injection 4 mg (4 mg Intravenous Given 07/12/22 1955)  pantoprazole (PROTONIX) injection 40 mg (40 mg Intravenous Given 07/12/22 1955)  iohexol (OMNIPAQUE) 350 MG/ML injection 75 mL (75 mLs Intravenous Contrast Given 07/12/22 2048)  ondansetron (ZOFRAN) injection 4 mg (4 mg Intravenous Given 07/12/22 2117)  HYDROmorphone (DILAUDID) injection 1 mg (1 mg Intravenous Given 07/12/22 2117)                                                                                                                                     Procedures Procedures  (including critical care time)  Medical Decision Making / ED Course    Medical Decision Making:    [place] Tamalyn Melka is a 47 y.o. female  with past medical history as below, significant for colitis, obesity, THC abuse, type II DM, hypertension, GAD, panic disorder who presents  to the ED with complaint of abdominal pain, BRBPR, diarrhea.. The complaint involves an  extensive differential diagnosis and also carries with it a high risk of complications and morbidity.  Serious etiology was considered. Ddx includes but is not limited to: Differential diagnosis includes but is not exclusive to acute cholecystitis, intrathoracic causes for epigastric abdominal pain, gastritis, duodenitis, pancreatitis, small bowel or large bowel obstruction, abdominal aortic aneurysm, hernia, gastritis, etc. Differential diagnosis includes but is not exclusive to ectopic pregnancy, ovarian cyst, ovarian torsion, acute appendicitis, urinary tract infection, endometriosis, bowel obstruction, hernia, colitis, renal colic, gastroenteritis, volvulus etc.   Complete initial physical exam performed, notably the patient  was no acute distress, she is tachycardic, abdomen is not peritoneal.    Reviewed and confirmed nursing documentation for past medical history, family history, social history.  Vital signs reviewed.    Clinical Course as of 07/12/22 2126  Wynelle Link Jul 12, 2022  1942 No recent travel or sick contacts, no thinners, has some LLQ pain with defecation. Similar symptoms in the past that did resolve spontaneously. Nausea w/o vomiting, some epig cramping/burning pain as well. No urination changes. Does not follow w/ GI [SG]  2028 WBC(!): 15.4 Chronically elevated [SG]  2028 Hemoglobin(!): 10.2 Similar to baseline [SG]  2028 Potassium(!): 3.3 will replace orally [SG]  2044 Pt had bowel movement, diarrhea, bright red blood mixed with stool.  [SG]  2116 CTAP was stable, labs stable. Pain persisting, now having worsening nausea and vomiting. Will give dilaudid/zofran and re-check. Abd is not peritoneal.  [SG]  2122 Hemoccult was positive.  Internal and external hemorrhoid palpated on exam.  No frank bleeding appreciated.  Still having some abdominal pain.  Her labs are stable, she has leukocytosis that  she has had chronically, possibly reactive in setting of vomiting.  Afebrile, does not appear to be septic.  She does not follow with GI currently.  Will sign outpatient incoming EDP pending recheck after analgesics and antiemetic, she is hemodynamically stable would favor discharge with outpatient evaluation by GI [SG]    Clinical Course User Index [SG] Sloan Leiter, DO            Additional history obtained: -Additional history obtained from na -External records from outside source obtained and reviewed including: Chart review including previous notes, labs, imaging, consultation notes including prior primary care documentation, prior admission, prior surgery 2021 with ventral hernia repair Medications, prior labs and imaging   Lab Tests: -I ordered, reviewed, and interpreted labs.   The pertinent results include:   Labs Reviewed  CBC WITH DIFFERENTIAL/PLATELET - Abnormal; Notable for the following components:      Result Value   WBC 15.4 (*)    Hemoglobin 10.2 (*)    HCT 33.1 (*)    MCH 24.6 (*)    RDW 16.9 (*)    Platelets 439 (*)    Neutro Abs 12.1 (*)    All other components within normal limits  COMPREHENSIVE METABOLIC PANEL - Abnormal; Notable for the following components:   Potassium 3.3 (*)    Glucose, Bld 148 (*)    All other components within normal limits  LIPASE, BLOOD  HCG, SERUM, QUALITATIVE  URINALYSIS, ROUTINE W REFLEX MICROSCOPIC  POC OCCULT BLOOD, ED    Notable for as above, leukocytosis noted  EKG   EKG Interpretation  Date/Time:    Ventricular Rate:    PR Interval:    QRS Duration:   QT Interval:    QTC Calculation:   R Axis:     Text Interpretation:  Imaging Studies ordered: I ordered imaging studies including CT abdomen pelvis with IV contrast I independently visualized the following imaging with scope of interpretation limited to determining acute life threatening conditions related to emergency care; findings noted  above, significant for stable CT I independently visualized and interpreted imaging. I agree with the radiologist interpretation   Medicines ordered and prescription drug management: Meds ordered this encounter  Medications   sodium chloride 0.9 % bolus 1,000 mL   ondansetron (ZOFRAN) injection 4 mg   pantoprazole (PROTONIX) injection 40 mg   potassium chloride SA (KLOR-CON M) CR tablet 40 mEq   iohexol (OMNIPAQUE) 350 MG/ML injection 75 mL   ondansetron (ZOFRAN) injection 4 mg   HYDROmorphone (DILAUDID) injection 1 mg   pantoprazole (PROTONIX) 20 MG tablet    Sig: Take 1 tablet (20 mg total) by mouth daily for 14 days.    Dispense:  14 tablet    Refill:  0   ondansetron (ZOFRAN) 4 MG tablet    Sig: Take 1 tablet (4 mg total) by mouth every 4 (four) hours as needed for nausea or vomiting.    Dispense:  12 tablet    Refill:  0   dicyclomine (BENTYL) 20 MG tablet    Sig: Take 1 tablet (20 mg total) by mouth 2 (two) times daily for 7 days.    Dispense:  14 tablet    Refill:  0    -I have reviewed the patients home medicines and have made adjustments as needed   Consultations Obtained: na   Cardiac Monitoring: The patient was maintained on a cardiac monitor.  I personally viewed and interpreted the cardiac monitored which showed an underlying rhythm of: sinus tachy > NSR  Social Determinants of Health:  Diagnosis or treatment significantly limited by social determinants of health: obesity THC use   Reevaluation: After the interventions noted above, I reevaluated the patient and found that they have improved  Co morbidities that complicate the patient evaluation  Past Medical History:  Diagnosis Date   Chest pain    Colitis    Hypertension    Marijuana abuse    Morbid obesity (HCC)    Panic attacks    Type 2 diabetes mellitus with hyperlipidemia (HCC) 12/22/2016      Dispostion: Disposition decision including need for hospitalization was considered, and patient  disposition pending at time of sign out.    Final Clinical Impression(s) / ED Diagnoses Final diagnoses:  Rectal bleeding  Nausea vomiting and diarrhea  Hemorrhoids, unspecified hemorrhoid type     This chart was dictated using voice recognition software.  Despite best efforts to proofread,  errors can occur which can change the documentation meaning.    Sloan Leiter, DO 07/12/22 2126

## 2022-07-13 DIAGNOSIS — E1169 Type 2 diabetes mellitus with other specified complication: Secondary | ICD-10-CM

## 2022-07-13 DIAGNOSIS — E876 Hypokalemia: Secondary | ICD-10-CM | POA: Diagnosis not present

## 2022-07-13 DIAGNOSIS — I159 Secondary hypertension, unspecified: Secondary | ICD-10-CM

## 2022-07-13 DIAGNOSIS — R112 Nausea with vomiting, unspecified: Secondary | ICD-10-CM | POA: Diagnosis not present

## 2022-07-13 DIAGNOSIS — F411 Generalized anxiety disorder: Secondary | ICD-10-CM | POA: Diagnosis not present

## 2022-07-13 DIAGNOSIS — K922 Gastrointestinal hemorrhage, unspecified: Secondary | ICD-10-CM

## 2022-07-13 DIAGNOSIS — E785 Hyperlipidemia, unspecified: Secondary | ICD-10-CM

## 2022-07-13 DIAGNOSIS — F410 Panic disorder [episodic paroxysmal anxiety] without agoraphobia: Secondary | ICD-10-CM | POA: Diagnosis not present

## 2022-07-13 LAB — URINALYSIS, ROUTINE W REFLEX MICROSCOPIC
Bilirubin Urine: NEGATIVE
Glucose, UA: NEGATIVE mg/dL
Ketones, ur: NEGATIVE mg/dL
Leukocytes,Ua: NEGATIVE
Nitrite: NEGATIVE
Protein, ur: NEGATIVE mg/dL
Specific Gravity, Urine: >1.046 — ABNORMAL HIGH (ref 1.005–1.030)
pH: 5 (ref 5.0–8.0)

## 2022-07-13 LAB — CBG MONITORING, ED: Glucose-Capillary: 167 mg/dL — ABNORMAL HIGH (ref 70–99)

## 2022-07-13 LAB — OCCULT BLOOD, POC DEVICE: Fecal Occult Bld: POSITIVE — AB

## 2022-07-13 MED ORDER — INSULIN ASPART 100 UNIT/ML IJ SOLN: 0.0000 [IU] | Freq: Three times a day (TID) | INTRAMUSCULAR | Status: DC

## 2022-07-13 MED ORDER — ORAL CARE MOUTH RINSE: 15.0000 mL | OROMUCOSAL | Status: DC | PRN

## 2022-07-13 MED ORDER — INSULIN ASPART 100 UNIT/ML IJ SOLN: 0.0000 [IU] | Freq: Every day | INTRAMUSCULAR | Status: DC

## 2022-07-13 NOTE — Discharge Summary (Signed)
Physician Discharge Summary   Patient: Cassandra Greer MRN: [id number] DOB: [birth year]  Admit date:     07/12/2022  Discharge date: 07/13/22  Discharge Physician: Lavone Neri Yazleen Molock   PCP: Medicine, [facility]   Patient left AMA   Discharge Diagnoses: Principal Problem:   Intractable nausea and vomiting Active Problems:   HTN (hypertension)   Hypokalemia   Type 2 diabetes mellitus with hyperlipidemia (HCC)   Generalized anxiety disorder with panic attacks   Lower GI bleed   Hospital Course: Cassandra Greer is a 47 year old female with history of hypertension, hyperlipidemia, type 2 diabetes mellitus, and anxiety with panic attacks who presented to the emergency department with nausea, vomiting, and bloody diarrhea.   She was treated in the emergency department with IV fluids, Zofran, Dilaudid, potassium, Protonix, and droperidol.  She was unable to tolerate any oral intake and was admitted to the hospitalist service.  Shortly after arriving to the inpatient unit, patient indicated that she would be leaving the hospital AMA.  She did not have any specific complaint or concern, but was adamant about returning home.  She was alert, fully oriented, and understanding of the risks associated with her decision.     Follow-up Information     Call  Hilarie Fredrickson, MD.   Specialty: Gastroenterology Why: Follow up from ER visit Contact information: [address] [place] [address] [phone]         Call  Medicine, [facility].   Specialty: Family Medicine Why: Follow up from ER visit Contact information: [address] [place] [address] [phone]         Go to  [facility] Emergency Department at [facility].   Specialty: Emergency Medicine Why: As needed, If symptoms worsen Contact information: [address] [id number] [address]he[place] [address] [address] [phone]                Discharge Exam: Ceasar Mons Weights   07/13/22 0150  Weight: 115 kg    Condition at discharge:  Guarded.   The results of significant diagnostics from this hospitalization (including imaging, microbiology, ancillary and laboratory) are listed below for reference.   Imaging Studies: CT ABDOMEN PELVIS W CONTRAST  Result Date: 07/12/2022 CLINICAL DATA:  Right lower quadrant pain, bright red blood per rectum EXAM: CT ABDOMEN AND PELVIS WITH CONTRAST TECHNIQUE: Multidetector CT imaging of the abdomen and pelvis was performed using the standard protocol following bolus administration of intravenous contrast. RADIATION DOSE REDUCTION: This exam was performed according to the departmental dose-optimization program which includes automated exposure control, adjustment of the mA and/or kV according to patient size and/or use of iterative reconstruction technique. CONTRAST:  75mL OMNIPAQUE IOHEXOL 350 MG/ML SOLN COMPARISON:  11/27/2019 FINDINGS: Lower chest: No acute pleural or parenchymal lung disease. Hepatobiliary: No focal liver abnormality is seen. Status post cholecystectomy. No biliary dilatation. Pancreas: Unremarkable. No pancreatic ductal dilatation or surrounding inflammatory changes. Spleen: Normal in size without focal abnormality. Adrenals/Urinary Tract: Adrenal glands are unremarkable. Kidneys are normal, without renal calculi, focal lesion, or hydronephrosis. Bladder is unremarkable. Stomach/Bowel: No bowel obstruction or ileus. Normal appendix right lower quadrant. No bowel wall thickening or inflammatory change. Vascular/Lymphatic: Aortic atherosclerosis. No enlarged abdominal or pelvic lymph nodes. Reproductive: Cystic areas within the cervix likely reflect nabothian cyst. 3.5 cm subserosal fibroid off the ventral aspect of the uterus. No adnexal masses. Other: No free fluid or free intraperitoneal gas. No abdominal wall hernia. Prior umbilical hernia repair.  Musculoskeletal: No acute or  destructive bony abnormalities. Reconstructed images demonstrate no additional findings. IMPRESSION: 1. No acute intra-abdominal or intrapelvic process. Normal appendix. 2. Fibroid uterus. 3.  Aortic Atherosclerosis (ICD10-I70.0). Electronically Signed   By: Sharlet Salina M.D.   On: 07/12/2022 21:02    Microbiology: Results for orders placed or performed during the hospital encounter of 11/27/19  Respiratory Panel by RT PCR (Flu A&B, Covid) - Nasopharyngeal Swab     Status: None   Collection Time: 11/28/19  6:34 AM   Specimen: Nasopharyngeal Swab  Result Value Ref Range Status   SARS Coronavirus 2 by RT PCR NEGATIVE NEGATIVE Final    Comment: (NOTE) SARS-CoV-2 target nucleic acids are NOT DETECTED.  The SARS-CoV-2 RNA is generally detectable in upper respiratoy specimens during the acute phase of infection. The lowest concentration of SARS-CoV-2 viral copies this assay can detect is 131 copies/mL. A negative result does not preclude SARS-Cov-2 infection and should not be used as the sole basis for treatment or other patient management decisions. A negative result may occur with  improper specimen collection/handling, submission of specimen other than nasopharyngeal swab, presence of viral mutation(s) within the areas targeted by this assay, and inadequate number of viral copies (<131 copies/mL). A negative result must be combined with clinical observations, patient history, and epidemiological information. The expected result is Negative.  Fact Sheet for Patients:  https://www.moore.com/  Fact Sheet for Healthcare Providers:  https://www.young.biz/  This test is no t yet approved or cleared by the [place] FDA and  has been authorized for detection and/or diagnosis of SARS-CoV-2 by FDA under an Emergency Use Authorization (EUA). This EUA will remain  in effect (meaning this test can be used) for the duration of the COVID-19 declaration  under Section 564(b)(1) of the Act, 21 U.S.C. section 360bbb-3(b)(1), unless the authorization is terminated or revoked sooner.     Influenza A by PCR NEGATIVE NEGATIVE Final   Influenza B by PCR NEGATIVE NEGATIVE Final    Comment: (NOTE) The Xpert Xpress SARS-CoV-2/FLU/RSV assay is intended as an aid in  the diagnosis of influenza from Nasopharyngeal swab specimens and  should not be used as a sole basis for treatment. Nasal washings and  aspirates are unacceptable for Xpert Xpress SARS-CoV-2/FLU/RSV  testing.  Fact Sheet for Patients: https://www.moore.com/  Fact Sheet for Healthcare Providers: https://www.young.biz/  This test is not yet approved or cleared by the [place] FDA and  has been authorized for detection and/or diagnosis of SARS-CoV-2 by  FDA under an Emergency Use Authorization (EUA). This EUA will remain  in effect (meaning this test can be used) for the duration of the  Covid-19 declaration under Section 564(b)(1) of the Act, 21  U.S.C. section 360bbb-3(b)(1), unless the authorization is  terminated or revoked. Performed at [facility], [address] [place], [place] [address]     Labs: CBC: Recent Labs  Lab 07/12/22 1947  WBC 15.4*  NEUTROABS 12.1*  HGB 10.2*  HCT 33.1*  MCV 80.0  PLT 439*   Basic Metabolic Panel: Recent Labs  Lab 07/12/22 1947  NA 138  K 3.3*  CL 99  CO2 24  GLUCOSE 148*  BUN 6  CREATININE 0.83  CALCIUM 9.1   Liver Function Tests: Recent Labs  Lab 07/12/22 1947  AST 26  ALT 16  ALKPHOS 83  BILITOT 0.5  PROT 6.9  ALBUMIN 3.6   CBG: Recent Labs  Lab 07/13/22 0038  GLUCAP 167*    Discharge  time spent: less than 30 minutes.  Signed: Briscoe Deutscher, MD [facility] 07/13/2022

## 2022-07-13 NOTE — ED Notes (Signed)
ED TO INPATIENT HANDOFF REPORT  ED Nurse Name and Phone #: Darvin Neighbours [phone]  S Name/Age/Gender Cassandra Greer 47 y.o. female Room/Bed: 012C/012C  Code Status   Code Status: Full Code  Home/SNF/Other Home Patient oriented to: self, place, time, and situation Is this baseline? Yes   Triage Complete: Triage complete  Chief Complaint Intractable nausea and vomiting [R11.2]  Triage Note Pt came in via POV d/t abd cramping & diarrhea with blood clots in it. A/Ox4, rates pain 9/10, feels nauseous, denies lightheaded or dizziness. Does NOT take thinners.    Allergies Allergies  Allergen Reactions   Morphine And Codeine Itching    Level of Care/Admitting Diagnosis ED Disposition     ED Disposition  Admit   Condition  --   Comment  Hospital Area: [facility] [[id number]]  Level of Care: Telemetry Medical [104]  May place patient in observation at [facility] or [facility] if equivalent level of care is available:: Yes  Covid Evaluation: Asymptomatic - no recent exposure (last 10 days) testing not required  Diagnosis: Intractable nausea and vomiting [720114]  Admitting Physician: Briscoe Deutscher [[id number]]  Attending Physician: Briscoe Deutscher [[id number]]          B Medical/Surgery History Past Medical History:  Diagnosis Date   Chest pain    Colitis    Hypertension    Marijuana abuse    Morbid obesity (HCC)    Panic attacks    Type 2 diabetes mellitus with hyperlipidemia (HCC) 12/22/2016   Past Surgical History:  Procedure Laterality Date   CHOLECYSTECTOMY N/A 02/21/2018   Procedure: LAPAROSCOPIC CHOLECYSTECTOMY WITH INTRAOPERATIVE CHOLANGIOGRAM;  Surgeon: Manus Rudd, MD;  Location: [facility] OR;  Service: General;  Laterality: N/A;   INSERTION OF MESH N/A 11/28/2019   Procedure: INSERTION OF MESH;  Surgeon: Harriette Bouillon, MD;  Location: [facility] OR;  Service: General;  Laterality: N/A;   TUBAL LIGATION     VENTRAL HERNIA REPAIR N/A 11/28/2019    Procedure: OPEN VENTRAL HERNIA REPAIR;  Surgeon: Harriette Bouillon, MD;  Location: [facility] OR;  Service: General;  Laterality: N/A;     A IV Location/Drains/Wounds Patient Lines/Drains/Airways Status     Active Line/Drains/Airways     Name Placement date Placement time Site Days   Peripheral IV 07/12/22 20 G Right Antecubital 07/12/22  1953  Antecubital  1   Incision (Closed) 02/21/18 Abdomen Other (Comment) 02/21/18  0851  -- 1603   Incision (Closed) 11/28/19 Abdomen Other (Comment) 11/28/19  1500  -- 958   Incision - 4 Ports Abdomen 1: Umbilicus 2: Medial;Upper 3: Right;Medial 4: Right;Lateral 02/21/18  0800  -- 1603            Intake/Output Last 24 hours No intake or output data in the 24 hours ending 07/13/22 0034  Labs/Imaging Results for orders placed or performed during the hospital encounter of 07/12/22 (from the past 48 hour(s))  CBC with Differential     Status: Abnormal   Collection Time: 07/12/22  7:47 PM  Result Value Ref Range   WBC 15.4 (H) 4.0 - 10.5 K/uL   RBC 4.14 3.87 - 5.11 MIL/uL   Hemoglobin 10.2 (L) 12.0 - 15.0 g/dL   HCT 95.6 (L) 21.3 - 08.6 %   MCV 80.0 80.0 - 100.0 fL   MCH 24.6 (L) 26.0 - 34.0 pg   MCHC 30.8 30.0 - 36.0 g/dL   RDW 57.8 (H) 46.9 - 62.9 %   Platelets 439 (H) 150 -  400 K/uL   nRBC 0.0 0.0 - 0.2 %   Neutrophils Relative % 79 %   Neutro Abs 12.1 (H) 1.7 - 7.7 K/uL   Lymphocytes Relative 14 %   Lymphs Abs 2.2 0.7 - 4.0 K/uL   Monocytes Relative 5 %   Monocytes Absolute 0.8 0.1 - 1.0 K/uL   Eosinophils Relative 2 %   Eosinophils Absolute 0.2 0.0 - 0.5 K/uL   Basophils Relative 0 %   Basophils Absolute 0.1 0.0 - 0.1 K/uL   Immature Granulocytes 0 %   Abs Immature Granulocytes 0.06 0.00 - 0.07 K/uL    Comment: Performed at [facility], [address] [place], [place] [address]  Comprehensive metabolic panel     Status: Abnormal   Collection Time: 07/12/22  7:47 PM  Result Value Ref Range   Sodium 138 135 - 145 mmol/L    Potassium 3.3 (L) 3.5 - 5.1 mmol/L   Chloride 99 98 - 111 mmol/L   CO2 24 22 - 32 mmol/L   Glucose, Bld 148 (H) 70 - 99 mg/dL    Comment: Glucose reference range applies only to samples taken after fasting for at least 8 hours.   BUN 6 6 - 20 mg/dL   Creatinine, Ser 9.62 0.44 - 1.00 mg/dL   Calcium 9.1 8.9 - 95.2 mg/dL   Total Protein 6.9 6.5 - 8.1 g/dL   Albumin 3.6 3.5 - 5.0 g/dL   AST 26 15 - 41 U/L   ALT 16 0 - 44 U/L   Alkaline Phosphatase 83 38 - 126 U/L   Total Bilirubin 0.5 0.3 - 1.2 mg/dL   GFR, Estimated >84 >13 mL/min    Comment: (NOTE) Calculated using the CKD-EPI Creatinine Equation (2021)    Anion gap 15 5 - 15    Comment: Performed at [facility], [address] [place], [place] [address]  Lipase, blood     Status: None   Collection Time: 07/12/22  7:47 PM  Result Value Ref Range   Lipase 36 11 - 51 U/L    Comment: Performed at [facility], [address], [place], [place] [address]  hCG, serum, qualitative     Status: None   Collection Time: 07/12/22  7:47 PM  Result Value Ref Range   Preg, Serum NEGATIVE NEGATIVE    Comment:        THE SENSITIVITY OF THIS METHODOLOGY IS >10 mIU/mL. Performed at [facility], [address], [place], [place] [address]    CT ABDOMEN PELVIS W CONTRAST  Result Date: 07/12/2022 CLINICAL DATA:  Right lower quadrant pain, bright red blood per rectum EXAM: CT ABDOMEN AND PELVIS WITH CONTRAST TECHNIQUE: Multidetector CT imaging of the abdomen and pelvis was performed using the standard protocol following bolus administration of intravenous contrast. RADIATION DOSE REDUCTION: This exam was performed according to the departmental dose-optimization program which includes automated exposure control, adjustment of the mA and/or kV according to patient size and/or use of iterative reconstruction technique. CONTRAST:  75mL OMNIPAQUE IOHEXOL 350 MG/ML SOLN COMPARISON:  11/27/2019 FINDINGS: Lower chest: No acute pleural or  parenchymal lung disease. Hepatobiliary: No focal liver abnormality is seen. Status post cholecystectomy. No biliary dilatation. Pancreas: Unremarkable. No pancreatic ductal dilatation or surrounding inflammatory changes. Spleen: Normal in size without focal abnormality. Adrenals/Urinary Tract: Adrenal glands are unremarkable. Kidneys are normal, without renal calculi, focal lesion, or hydronephrosis. Bladder is unremarkable. Stomach/Bowel: No bowel obstruction or ileus. Normal appendix  right lower quadrant. No bowel wall thickening or inflammatory change. Vascular/Lymphatic: Aortic atherosclerosis. No enlarged abdominal or pelvic lymph nodes. Reproductive: Cystic areas within the cervix likely reflect nabothian cyst. 3.5 cm subserosal fibroid off the ventral aspect of the uterus. No adnexal masses. Other: No free fluid or free intraperitoneal gas. No abdominal wall hernia. Prior umbilical hernia repair. Musculoskeletal: No acute or destructive bony abnormalities. Reconstructed images demonstrate no additional findings. IMPRESSION: 1. No acute intra-abdominal or intrapelvic process. Normal appendix. 2. Fibroid uterus. 3.  Aortic Atherosclerosis (ICD10-I70.0). Electronically Signed   By: Sharlet Salina M.D.   On: 07/12/2022 21:02    Pending Labs Unresulted Labs (From admission, onward)     Start     Ordered   07/13/22 0500  HIV Antibody (routine testing w rflx)  (HIV Antibody (Routine testing w reflex) panel)  Tomorrow morning,   R        07/12/22 2320   07/13/22 0500  Basic metabolic panel  Daily,   R      07/12/22 2320   07/13/22 0500  Magnesium  Tomorrow morning,   R        07/12/22 2320   07/13/22 0500  Hemoglobin A1c  Tomorrow morning,   R       Comments: To assess prior glycemic control    07/13/22 0001   07/12/22 2321  Gastrointestinal Panel by PCR , Stool  (Gastrointestinal Panel by PCR, Stool                                                                                                                                                      **Does Not include CLOSTRIDIUM DIFFICILE testing. **If CDIFF testing is needed, place order from the "C Difficile Testing" order set.**)  Once,   R        07/12/22 2320   07/12/22 2319  CBC  Now then every 8 hours,   R (with TIMED occurrences)      07/12/22 2320   07/12/22 2318  Type and screen [facility]  (Blood Administration Adult)  Once,   R       Comments: [facility]    07/12/22 2320   07/12/22 1914  Urinalysis, Routine w reflex microscopic -Urine, Clean Catch  (ED Abdominal Pain)  Once,   URGENT       Question:  Specimen Source  Answer:  Urine, Clean Catch   07/12/22 1914            Vitals/Pain Today's Vitals   07/12/22 2242 07/12/22 2242 07/12/22 2245 07/12/22 2330  BP:  124/82 122/84 (!) 131/90  Pulse:  77 76 95  Resp:  16 15 17   Temp: (!) 97.2 F (36.2 C)     TempSrc: Oral     SpO2:  94% 94% 94%  PainSc:        Isolation Precautions Enteric precautions (UV disinfection)  Medications Medications  sodium chloride flush (NS) 0.9 % injection 3 mL (3 mLs Intravenous Not Given 07/12/22 2336)  lactated ringers infusion (has no administration in time range)  acetaminophen (TYLENOL) tablet 650 mg (has no administration in time range)    Or  acetaminophen (TYLENOL) suppository 650 mg (has no administration in time range)  HYDROmorphone (DILAUDID) injection 0.5 mg (has no administration in time range)  prochlorperazine (COMPAZINE) injection 5 mg (has no administration in time range)  ondansetron (ZOFRAN) injection 4 mg (has no administration in time range)  amLODipine (NORVASC) tablet 10 mg (has no administration in time range)  atorvastatin (LIPITOR) tablet 20 mg (has no administration in time range)  lisinopril (ZESTRIL) tablet 40 mg (has no administration in time range)  hydrOXYzine (ATARAX) tablet 25 mg (has no administration in time range)  QUEtiapine (SEROQUEL) tablet 50-100 mg (has no  administration in time range)  insulin aspart (novoLOG) injection 0-6 Units (has no administration in time range)  insulin aspart (novoLOG) injection 0-5 Units (has no administration in time range)  sodium chloride 0.9 % bolus 1,000 mL (0 mLs Intravenous Stopped 07/12/22 2149)  ondansetron (ZOFRAN) injection 4 mg (4 mg Intravenous Given 07/12/22 1955)  pantoprazole (PROTONIX) injection 40 mg (40 mg Intravenous Given 07/12/22 1955)  potassium chloride SA (KLOR-CON M) CR tablet 40 mEq (40 mEq Oral Given 07/12/22 2205)  iohexol (OMNIPAQUE) 350 MG/ML injection 75 mL (75 mLs Intravenous Contrast Given 07/12/22 2048)  ondansetron (ZOFRAN) injection 4 mg (4 mg Intravenous Given 07/12/22 2117)  HYDROmorphone (DILAUDID) injection 1 mg (1 mg Intravenous Given 07/12/22 2117)  droperidol (INAPSINE) 2.5 MG/ML injection 2.5 mg (2.5 mg Intravenous Given 07/12/22 2321)  potassium chloride 10 mEq in 100 mL IVPB (10 mEq Intravenous New Bag/Given 07/12/22 2322)    Mobility walks     Focused Assessments Pulmonary Assessment Handoff:  Lung sounds:   O2 Device: Room Air      R Recommendations: See Admitting Provider Note  Report given to:   Additional Notes:

## 2022-07-13 NOTE — Progress Notes (Signed)
Pt requested to leave AMA. She states that being here makes her agitated. Dr Antionette Char paged and he offered Ativan bur pt declined saying nothing can make her stay. She signed leaving AMA paper. IV dc'd and patient walked off the floor. She said she has car in ED and will drive herself home. I offered to have her husband come pick her but she said NO. Charge nurse Carollee Herter made aware as well.

## 2022-07-16 ENCOUNTER — Emergency Department (HOSPITAL_COMMUNITY)
Admission: EM | Admit: 2022-07-16 | Discharge: 2022-07-17 | Disposition: A | Discharge status: Home / Self Care | Payer: Medicaid Other | Attending: Emergency Medicine | Admitting: Emergency Medicine

## 2022-07-16 ENCOUNTER — Encounter (HOSPITAL_COMMUNITY): Payer: Self-pay

## 2022-07-16 DIAGNOSIS — K644 Residual hemorrhoidal skin tags: Secondary | ICD-10-CM | POA: Diagnosis not present

## 2022-07-16 DIAGNOSIS — F411 Generalized anxiety disorder: Secondary | ICD-10-CM | POA: Diagnosis not present

## 2022-07-16 DIAGNOSIS — K625 Hemorrhage of anus and rectum: Secondary | ICD-10-CM | POA: Diagnosis not present

## 2022-07-16 DIAGNOSIS — K649 Unspecified hemorrhoids: Secondary | ICD-10-CM | POA: Diagnosis not present

## 2022-07-16 DIAGNOSIS — R110 Nausea: Secondary | ICD-10-CM | POA: Diagnosis not present

## 2022-07-16 DIAGNOSIS — K2970 Gastritis, unspecified, without bleeding: Secondary | ICD-10-CM | POA: Diagnosis not present

## 2022-07-16 DIAGNOSIS — Z79899 Other long term (current) drug therapy: Secondary | ICD-10-CM | POA: Insufficient documentation

## 2022-07-16 DIAGNOSIS — I10 Essential (primary) hypertension: Secondary | ICD-10-CM | POA: Insufficient documentation

## 2022-07-16 DIAGNOSIS — R109 Unspecified abdominal pain: Secondary | ICD-10-CM | POA: Diagnosis not present

## 2022-07-16 LAB — URINALYSIS, ROUTINE W REFLEX MICROSCOPIC
Bilirubin Urine: NEGATIVE
Glucose, UA: NEGATIVE mg/dL
Ketones, ur: NEGATIVE mg/dL
Leukocytes,Ua: NEGATIVE
Nitrite: NEGATIVE
Protein, ur: NEGATIVE mg/dL
Specific Gravity, Urine: 1.004 — ABNORMAL LOW (ref 1.005–1.030)
pH: 8 (ref 5.0–8.0)

## 2022-07-16 LAB — I-STAT BETA HCG BLOOD, ED (MC, WL, AP ONLY): I-stat hCG, quantitative: 24.5 m[IU]/mL — ABNORMAL HIGH (ref <5)

## 2022-07-16 LAB — CBC
HCT: 37.7 % (ref 36.0–46.0)
Hemoglobin: 11.1 g/dL — ABNORMAL LOW (ref 12.0–15.0)
MCH: 23.3 pg — ABNORMAL LOW (ref 26.0–34.0)
MCHC: 29.4 g/dL — ABNORMAL LOW (ref 30.0–36.0)
MCV: 79 fL — ABNORMAL LOW (ref 80.0–100.0)
Platelets: 623 10*3/uL — ABNORMAL HIGH (ref 150–400)
RBC: 4.77 MIL/uL (ref 3.87–5.11)
RDW: 17.3 % — ABNORMAL HIGH (ref 11.5–15.5)
WBC: 16.8 10*3/uL — ABNORMAL HIGH (ref 4.0–10.5)
nRBC: 0 % (ref 0.0–0.2)

## 2022-07-16 LAB — COMPREHENSIVE METABOLIC PANEL
ALT: 18 U/L (ref 0–44)
AST: 17 U/L (ref 15–41)
Albumin: 3.9 g/dL (ref 3.5–5.0)
Alkaline Phosphatase: 73 U/L (ref 38–126)
Anion gap: 18 — ABNORMAL HIGH (ref 5–15)
BUN: 9 mg/dL (ref 6–20)
CO2: 23 mmol/L (ref 22–32)
Calcium: 10.3 mg/dL (ref 8.9–10.3)
Chloride: 97 mmol/L — ABNORMAL LOW (ref 98–111)
Creatinine, Ser: 0.83 mg/dL (ref 0.44–1.00)
GFR, Estimated: >60 mL/min (ref >60)
Glucose, Bld: 74 mg/dL (ref 70–99)
Potassium: 3.7 mmol/L (ref 3.5–5.1)
Sodium: 138 mmol/L (ref 135–145)
Total Bilirubin: 0.5 mg/dL (ref 0.3–1.2)
Total Protein: 7.7 g/dL (ref 6.5–8.1)

## 2022-07-16 LAB — LIPASE, BLOOD: Lipase: 30 U/L (ref 11–51)

## 2022-07-16 NOTE — ED Triage Notes (Signed)
Pt sent by PCP for evaluation of bp 145/107 and generalized abd pain; took BP meds this am; c/o abd pain and rectal pain since past Saturday, seen for same past Saturday, left AMA due to anxiety; LBM yesterday; endorses nausea and dry heaving; denies urinary issues; hx gallbladder removal

## 2022-07-16 NOTE — ED Provider Triage Note (Signed)
Emergency Medicine Provider Triage Evaluation Note  Cassandra Greer , a 47 y.o. female  was evaluated in triage.  Pt complains of rectal pain, HTN, seen here 07/13/22 admitted for intractable vomiting but had a panic attack and left AMA. Followed clear liquid diet, no longer vomiting. Followed up with PCP today who sent her back to the ER for elevated BP. States took her BP med this morning for the first time in the past few days, dry heaves but did keep it down. Stools watery which she relates to her liquid diet.  Review of Systems  Positive:  Negative:   Physical Exam  BP (!) 155/110 (BP Location: Right Arm)   Pulse (!) 112   Temp 98.6 F (37 C)   Resp 17   SpO2 97%  Gen:   Awake, no distress   Resp:  Normal effort  MSK:   Moves extremities without difficulty  Other:  Anxious appearing  Medical Decision Making  Medically screening exam initiated at 5:26 PM.  Appropriate orders placed.  Cassandra Greer was informed that the remainder of the evaluation will be completed by another provider, this initial triage assessment does not replace that evaluation, and the importance of remaining in the ED until their evaluation is complete.  Advised patient she can take her own hydroxyzine if needed.   Cassandra Fend, PA-C 07/16/22 1726

## 2022-07-17 ENCOUNTER — Emergency Department (HOSPITAL_COMMUNITY): Payer: Medicaid Other

## 2022-07-17 ENCOUNTER — Other Ambulatory Visit: Payer: Self-pay

## 2022-07-17 DIAGNOSIS — R110 Nausea: Secondary | ICD-10-CM | POA: Diagnosis not present

## 2022-07-17 DIAGNOSIS — R109 Unspecified abdominal pain: Secondary | ICD-10-CM | POA: Diagnosis not present

## 2022-07-17 LAB — HCG, QUANTITATIVE, PREGNANCY: hCG, Beta Chain, Quant, S: <1 m[IU]/mL (ref <5)

## 2022-07-17 MED ORDER — HYDROCORTISONE ACETATE 25 MG RE SUPP: 25.0000 mg | Freq: Two times a day (BID) | RECTAL | 0 refills | Status: DC

## 2022-07-17 MED ORDER — ACETAMINOPHEN 500 MG PO TABS
1000.0000 mg | ORAL_TABLET | Freq: Once | ORAL | Status: AC
  Administered 2022-07-17: 1000 mg via ORAL
  Filled 2022-07-17: qty 2

## 2022-07-17 MED ORDER — DICYCLOMINE HCL 10 MG PO CAPS
10.0000 mg | ORAL_CAPSULE | Freq: Once | ORAL | Status: AC
  Administered 2022-07-17: 10 mg via ORAL
  Filled 2022-07-17: qty 1

## 2022-07-17 MED ORDER — DICYCLOMINE HCL 20 MG PO TABS: 20.0000 mg | ORAL_TABLET | Freq: Two times a day (BID) | ORAL | 0 refills | Status: DC

## 2022-07-17 MED ORDER — PANTOPRAZOLE SODIUM 40 MG IV SOLR
40.0000 mg | Freq: Once | INTRAVENOUS | Status: AC
  Administered 2022-07-17: 40 mg via INTRAVENOUS
  Filled 2022-07-17: qty 10

## 2022-07-17 MED ORDER — PANTOPRAZOLE SODIUM 40 MG PO TBEC: 40.0000 mg | DELAYED_RELEASE_TABLET | Freq: Every day | ORAL | 0 refills | Status: AC

## 2022-07-17 MED ORDER — HYDROCORTISONE (PERIANAL) 2.5 % EX CREA: 1.0000 | TOPICAL_CREAM | Freq: Two times a day (BID) | CUTANEOUS | 0 refills | Status: DC

## 2022-07-17 MED ORDER — PROCHLORPERAZINE EDISYLATE 10 MG/2ML IJ SOLN
10.0000 mg | Freq: Once | INTRAMUSCULAR | Status: AC
  Administered 2022-07-17: 10 mg via INTRAVENOUS
  Filled 2022-07-17: qty 2

## 2022-07-17 MED ORDER — LACTATED RINGERS IV BOLUS
1000.0000 mL | Freq: Once | INTRAVENOUS | Status: AC
  Administered 2022-07-17: 1000 mL via INTRAVENOUS

## 2022-07-17 MED ORDER — SIMETHICONE 80 MG PO CHEW: 80.0000 mg | CHEWABLE_TABLET | Freq: Four times a day (QID) | ORAL | 0 refills | Status: DC | PRN

## 2022-07-17 MED ORDER — SIMETHICONE 80 MG PO CHEW
40.0000 mg | CHEWABLE_TABLET | Freq: Once | ORAL | Status: AC
  Administered 2022-07-17: 40 mg via ORAL
  Filled 2022-07-17: qty 1

## 2022-07-17 NOTE — ED Notes (Signed)
Patient noted to have dry heaving with small amount of emesis. Dr. Clayborne Dana notified for some nausea medicine.

## 2022-07-17 NOTE — ED Provider Notes (Signed)
[place] EMERGENCY DEPARTMENT AT [facility] Provider Note   CSN: [id number] Arrival date & time: 07/16/22  1654     History {Add pertinent medical, surgical, social history, OB history to HPI:1} Chief Complaint  Patient presents with   Abdominal Pain    Cassandra Greer is a 47 y.o. female.  Patient was here for a few days ago for abdominal pain, rectal pain and rectal bleeding.  She is found to have bleeding external hemorrhoids.  She was admitted to the hospital for persistent emesis.  Apparently she has some anxiety and left the hospital AGAINST MEDICAL ADVICE.  Persistent rectal pain.  Some abdominal cramping as well that comes and goes.  States that her vomiting is improved.  She went to her doctor today to follow-up for the rectal pain to get a referral to gastroenterology.  Reportedly her primary doctor checked her blood pressure and told her to come to the ER and did not actually evaluate her.  Patient has a history of hypertension and was in pain at the time of going to the doctor's office.  No fevers, lightheadedness or syncope.  No weakness or fatigue.  No other associated symptoms. Last BM yesterday and watery with some blood. Hasn't used anything for the hemorrhoids yet.    Abdominal Pain      Home Medications Prior to Admission medications   Medication Sig Start Date End Date Taking? Authorizing Provider  amLODipine (NORVASC) 5 MG tablet Take 10 mg by mouth daily.    [provider]  atorvastatin (LIPITOR) 20 MG tablet Take 20 mg by mouth daily.    [provider]  dicyclomine (BENTYL) 20 MG tablet Take 1 tablet (20 mg total) by mouth 2 (two) times daily for 7 days. 07/12/22 07/19/22  Sloan Leiter, DO  famotidine (PEPCID) 20 MG tablet Take 1 tablet (20 mg total) by mouth 2 (two) times daily. 10/02/18 11/28/19  Willy Eddy, MD  hydrochlorothiazide (HYDRODIURIL) 25 MG tablet Take 0.5 tablets (12.5 mg total) by mouth daily. Patient  taking differently: Take 6.25 mg by mouth daily.  03/17/18   Shon Hale, MD  hydrOXYzine (ATARAX/VISTARIL) 25 MG tablet Take 1 tablet (25 mg total) by mouth 3 (three) times daily as needed for anxiety. 03/17/18   Shon Hale, MD  ibuprofen (ADVIL) 800 MG tablet Take 1 tablet (800 mg total) by mouth every 8 (eight) hours as needed. 11/29/19   Cornett, Maisie Fus, MD  lisinopril (ZESTRIL) 40 MG tablet Take 40 mg by mouth daily.     [provider]  medroxyPROGESTERone (PROVERA) 5 MG tablet Take 1 tablet (5 mg total) by mouth daily. Alternate 14 days taking pill, then 14 days off. Do this monthly 11/16/19 11/15/20  Malachy Chamber, MD  ondansetron (ZOFRAN) 4 MG tablet Take 1 tablet (4 mg total) by mouth every 4 (four) hours as needed for nausea or vomiting. 07/12/22   Sloan Leiter, DO  pantoprazole (PROTONIX) 20 MG tablet Take 1 tablet (20 mg total) by mouth daily for 14 days. 07/12/22 07/26/22  Sloan Leiter, DO  polyethylene glycol (MIRALAX / GLYCOLAX) packet Take 17 g by mouth daily as needed for mild constipation or moderate constipation. 03/17/18   Shon Hale, MD  QUEtiapine (SEROQUEL) 50 MG tablet Take 50-100 mg by mouth See admin instructions. 50mg  in am and 100mg  at night    [provider]      Allergies    Morphine and codeine    Review  of Systems   Review of Systems  Gastrointestinal:  Positive for abdominal pain.    Physical Exam Updated Vital Signs BP (!) 162/107 (BP Location: Right Arm)   Pulse 95   Temp 98 F (36.7 C) (Oral)   Resp 16   Ht 5\' 6"  (1.676 m)   Wt 115 kg   SpO2 99%   BMI 40.92 kg/m  Physical Exam Vitals and nursing note reviewed.  Constitutional:      Appearance: She is well-developed.  HENT:     Head: Normocephalic and atraumatic.  Cardiovascular:     Rate and Rhythm: Normal rate and regular rhythm.  Pulmonary:     Effort: No respiratory distress.     Breath sounds: No stridor.  Abdominal:     General: There is no  distension.     Tenderness: There is generalized abdominal tenderness.  Genitourinary:    Comments: Deferred, known external hemorrhoids with exact same symptoms Musculoskeletal:     Cervical back: Normal range of motion.  Neurological:     Mental Status: She is alert.     ED Results / Procedures / Treatments   Labs (all labs ordered are listed, but only abnormal results are displayed) Labs Reviewed  COMPREHENSIVE METABOLIC PANEL - Abnormal; Notable for the following components:      Result Value   Chloride 97 (*)    Anion gap 18 (*)    All other components within normal limits  CBC - Abnormal; Notable for the following components:   WBC 16.8 (*)    Hemoglobin 11.1 (*)    MCV 79.0 (*)    MCH 23.3 (*)    MCHC 29.4 (*)    RDW 17.3 (*)    Platelets 623 (*)    All other components within normal limits  URINALYSIS, ROUTINE W REFLEX MICROSCOPIC - Abnormal; Notable for the following components:   Color, Urine COLORLESS (*)    Specific Gravity, Urine 1.004 (*)    Hgb urine dipstick SMALL (*)    Bacteria, UA FEW (*)    All other components within normal limits  I-STAT BETA HCG BLOOD, ED ([facility], [facility], [facility] ONLY) - Abnormal; Notable for the following components:   I-stat hCG, quantitative 24.5 (*)    All other components within normal limits  LIPASE, BLOOD  HCG, QUANTITATIVE, PREGNANCY    EKG None  Radiology No results found.  Procedures Procedures  {Document cardiac monitor, telemetry assessment procedure when appropriate:1}  Medications Ordered in ED Medications  dicyclomine (BENTYL) capsule 10 mg (has no administration in time range)  simethicone (MYLICON) chewable tablet 40 mg (has no administration in time range)    ED Course/ Medical Decision Making/ A&P   {   Click here for ABCD2, HEART and other calculatorsREFRESH Note before signing :1}                          Medical Decision Making Amount and/or Complexity of Data Reviewed Labs: ordered. Radiology:  ordered.  Risk OTC drugs. Prescription drug management.  Evaluate for sbo or other intraabdominal emergency. No indication for ct at this time, will get xr. Attempt to treat symptomatically. Her labs are improved. Nausea is improved. Will work on symptoms but likely ok for discharge with GI follow up, anusol/prep H.   Blood pressure elevated likely somewhat chronic but exacerbated by worry and acute pain. No e/o end organ damage related to same.  ***  {Document critical care time when  appropriate:1} {Document review of labs and clinical decision tools ie heart score, Chads2Vasc2 etc:1}  {Document your independent review of radiology images, and any outside records:1} {Document your discussion with family members, caretakers, and with consultants:1} {Document social determinants of health affecting pt's care:1} {Document your decision making why or why not admission, treatments were needed:1} Final Clinical Impression(s) / ED Diagnoses Final diagnoses:  None    Rx / DC Orders ED Discharge Orders     None

## 2022-09-30 DIAGNOSIS — F4001 Agoraphobia with panic disorder: Secondary | ICD-10-CM | POA: Diagnosis not present

## 2022-09-30 DIAGNOSIS — E785 Hyperlipidemia, unspecified: Secondary | ICD-10-CM | POA: Diagnosis not present

## 2022-09-30 DIAGNOSIS — E1169 Type 2 diabetes mellitus with other specified complication: Secondary | ICD-10-CM | POA: Diagnosis not present

## 2022-09-30 DIAGNOSIS — F411 Generalized anxiety disorder: Secondary | ICD-10-CM | POA: Diagnosis not present

## 2022-09-30 DIAGNOSIS — F319 Bipolar disorder, unspecified: Secondary | ICD-10-CM | POA: Diagnosis not present

## 2022-09-30 DIAGNOSIS — Z0283 Encounter for blood-alcohol and blood-drug test: Secondary | ICD-10-CM | POA: Diagnosis not present

## 2022-09-30 DIAGNOSIS — I10 Essential (primary) hypertension: Secondary | ICD-10-CM | POA: Diagnosis not present

## 2022-09-30 DIAGNOSIS — F909 Attention-deficit hyperactivity disorder, unspecified type: Secondary | ICD-10-CM | POA: Diagnosis not present

## 2022-10-19 DIAGNOSIS — F411 Generalized anxiety disorder: Secondary | ICD-10-CM | POA: Diagnosis not present

## 2022-10-28 ENCOUNTER — Other Ambulatory Visit: Payer: Self-pay | Admitting: Family

## 2022-10-28 ENCOUNTER — Other Ambulatory Visit: Payer: Self-pay

## 2022-10-28 DIAGNOSIS — F319 Bipolar disorder, unspecified: Secondary | ICD-10-CM | POA: Diagnosis not present

## 2022-10-28 DIAGNOSIS — Z1231 Encounter for screening mammogram for malignant neoplasm of breast: Secondary | ICD-10-CM

## 2022-10-28 DIAGNOSIS — Z23 Encounter for immunization: Secondary | ICD-10-CM | POA: Diagnosis not present

## 2022-10-28 DIAGNOSIS — F909 Attention-deficit hyperactivity disorder, unspecified type: Secondary | ICD-10-CM | POA: Diagnosis not present

## 2022-10-28 DIAGNOSIS — F4001 Agoraphobia with panic disorder: Secondary | ICD-10-CM | POA: Diagnosis not present

## 2022-10-28 DIAGNOSIS — I10 Essential (primary) hypertension: Secondary | ICD-10-CM | POA: Diagnosis not present

## 2022-10-28 DIAGNOSIS — Z0000 Encounter for general adult medical examination without abnormal findings: Secondary | ICD-10-CM | POA: Diagnosis not present

## 2022-10-28 DIAGNOSIS — Z1321 Encounter for screening for nutritional disorder: Secondary | ICD-10-CM | POA: Diagnosis not present

## 2022-10-28 DIAGNOSIS — F411 Generalized anxiety disorder: Secondary | ICD-10-CM | POA: Diagnosis not present

## 2022-10-28 DIAGNOSIS — E1169 Type 2 diabetes mellitus with other specified complication: Secondary | ICD-10-CM | POA: Diagnosis not present

## 2022-10-28 DIAGNOSIS — Z13220 Encounter for screening for lipoid disorders: Secondary | ICD-10-CM | POA: Diagnosis not present

## 2022-10-28 DIAGNOSIS — Z113 Encounter for screening for infections with a predominantly sexual mode of transmission: Secondary | ICD-10-CM | POA: Diagnosis not present

## 2022-10-28 DIAGNOSIS — Z1159 Encounter for screening for other viral diseases: Secondary | ICD-10-CM | POA: Diagnosis not present

## 2022-10-28 DIAGNOSIS — Z114 Encounter for screening for human immunodeficiency virus [HIV]: Secondary | ICD-10-CM | POA: Diagnosis not present

## 2022-10-30 ENCOUNTER — Ambulatory Visit
Admission: RE | Admit: 2022-10-30 | Discharge: 2022-10-30 | Disposition: A | Discharge status: Home / Self Care | Payer: Medicaid Other | Source: Ambulatory Visit | Attending: Family | Admitting: Family

## 2022-10-30 DIAGNOSIS — Z1231 Encounter for screening mammogram for malignant neoplasm of breast: Secondary | ICD-10-CM

## 2022-11-03 ENCOUNTER — Other Ambulatory Visit: Payer: Self-pay | Admitting: Family

## 2022-11-03 DIAGNOSIS — R234 Changes in skin texture: Secondary | ICD-10-CM

## 2022-11-09 DIAGNOSIS — H5213 Myopia, bilateral: Secondary | ICD-10-CM | POA: Diagnosis not present

## 2022-11-18 ENCOUNTER — Other Ambulatory Visit: Payer: Self-pay | Admitting: Family

## 2022-11-18 DIAGNOSIS — R234 Changes in skin texture: Secondary | ICD-10-CM

## 2022-11-18 DIAGNOSIS — F411 Generalized anxiety disorder: Secondary | ICD-10-CM | POA: Diagnosis not present

## 2022-11-26 DIAGNOSIS — Z6839 Body mass index (BMI) 39.0-39.9, adult: Secondary | ICD-10-CM | POA: Diagnosis not present

## 2022-11-26 DIAGNOSIS — N920 Excessive and frequent menstruation with regular cycle: Secondary | ICD-10-CM | POA: Diagnosis not present

## 2022-11-26 DIAGNOSIS — N946 Dysmenorrhea, unspecified: Secondary | ICD-10-CM | POA: Diagnosis not present

## 2022-11-26 DIAGNOSIS — I10 Essential (primary) hypertension: Secondary | ICD-10-CM | POA: Diagnosis not present

## 2022-11-26 DIAGNOSIS — E66812 Obesity, class 2: Secondary | ICD-10-CM | POA: Diagnosis not present

## 2022-11-26 DIAGNOSIS — F909 Attention-deficit hyperactivity disorder, unspecified type: Secondary | ICD-10-CM | POA: Diagnosis not present

## 2022-11-26 DIAGNOSIS — D509 Iron deficiency anemia, unspecified: Secondary | ICD-10-CM | POA: Diagnosis not present

## 2022-12-01 ENCOUNTER — Ambulatory Visit
Admission: RE | Admit: 2022-12-01 | Discharge: 2022-12-01 | Disposition: A | Discharge status: Home / Self Care | Payer: Medicaid Other | Source: Ambulatory Visit | Attending: Family | Admitting: Family

## 2022-12-01 ENCOUNTER — Ambulatory Visit: Payer: Medicaid Other

## 2022-12-01 DIAGNOSIS — F411 Generalized anxiety disorder: Secondary | ICD-10-CM | POA: Diagnosis not present

## 2022-12-01 DIAGNOSIS — R234 Changes in skin texture: Secondary | ICD-10-CM

## 2022-12-01 DIAGNOSIS — N6489 Other specified disorders of breast: Secondary | ICD-10-CM | POA: Diagnosis not present

## 2022-12-07 DIAGNOSIS — F411 Generalized anxiety disorder: Secondary | ICD-10-CM | POA: Diagnosis not present

## 2022-12-14 DIAGNOSIS — F411 Generalized anxiety disorder: Secondary | ICD-10-CM | POA: Diagnosis not present

## 2022-12-18 DIAGNOSIS — D509 Iron deficiency anemia, unspecified: Secondary | ICD-10-CM | POA: Diagnosis not present

## 2022-12-18 DIAGNOSIS — K625 Hemorrhage of anus and rectum: Secondary | ICD-10-CM | POA: Diagnosis not present

## 2022-12-23 DIAGNOSIS — F3112 Bipolar disorder, current episode manic without psychotic features, moderate: Secondary | ICD-10-CM | POA: Diagnosis not present

## 2022-12-23 DIAGNOSIS — F603 Borderline personality disorder: Secondary | ICD-10-CM | POA: Diagnosis not present

## 2022-12-25 DIAGNOSIS — L814 Other melanin hyperpigmentation: Secondary | ICD-10-CM | POA: Diagnosis not present

## 2022-12-25 DIAGNOSIS — L820 Inflamed seborrheic keratosis: Secondary | ICD-10-CM | POA: Diagnosis not present

## 2022-12-30 DIAGNOSIS — F319 Bipolar disorder, unspecified: Secondary | ICD-10-CM | POA: Diagnosis not present

## 2022-12-30 DIAGNOSIS — F909 Attention-deficit hyperactivity disorder, unspecified type: Secondary | ICD-10-CM | POA: Diagnosis not present

## 2022-12-30 DIAGNOSIS — F3112 Bipolar disorder, current episode manic without psychotic features, moderate: Secondary | ICD-10-CM | POA: Diagnosis not present

## 2022-12-30 DIAGNOSIS — F603 Borderline personality disorder: Secondary | ICD-10-CM | POA: Diagnosis not present

## 2022-12-30 DIAGNOSIS — F4001 Agoraphobia with panic disorder: Secondary | ICD-10-CM | POA: Diagnosis not present

## 2022-12-30 DIAGNOSIS — F411 Generalized anxiety disorder: Secondary | ICD-10-CM | POA: Diagnosis not present

## 2023-01-05 DIAGNOSIS — F603 Borderline personality disorder: Secondary | ICD-10-CM | POA: Diagnosis not present

## 2023-01-05 DIAGNOSIS — F3112 Bipolar disorder, current episode manic without psychotic features, moderate: Secondary | ICD-10-CM | POA: Diagnosis not present

## 2023-01-11 DIAGNOSIS — K640 First degree hemorrhoids: Secondary | ICD-10-CM | POA: Diagnosis not present

## 2023-01-11 DIAGNOSIS — K625 Hemorrhage of anus and rectum: Secondary | ICD-10-CM | POA: Diagnosis not present

## 2023-01-12 DIAGNOSIS — F603 Borderline personality disorder: Secondary | ICD-10-CM | POA: Diagnosis not present

## 2023-01-12 DIAGNOSIS — F3112 Bipolar disorder, current episode manic without psychotic features, moderate: Secondary | ICD-10-CM | POA: Diagnosis not present

## 2023-01-20 DIAGNOSIS — F3112 Bipolar disorder, current episode manic without psychotic features, moderate: Secondary | ICD-10-CM | POA: Diagnosis not present

## 2023-01-20 DIAGNOSIS — F603 Borderline personality disorder: Secondary | ICD-10-CM | POA: Diagnosis not present

## 2023-02-07 DIAGNOSIS — J069 Acute upper respiratory infection, unspecified: Secondary | ICD-10-CM | POA: Diagnosis not present

## 2023-02-07 DIAGNOSIS — R070 Pain in throat: Secondary | ICD-10-CM | POA: Diagnosis not present

## 2023-02-07 DIAGNOSIS — R0981 Nasal congestion: Secondary | ICD-10-CM | POA: Diagnosis not present

## 2023-02-07 DIAGNOSIS — R051 Acute cough: Secondary | ICD-10-CM | POA: Diagnosis not present

## 2023-02-08 DIAGNOSIS — F4312 Post-traumatic stress disorder, chronic: Secondary | ICD-10-CM | POA: Diagnosis not present

## 2023-02-08 DIAGNOSIS — F411 Generalized anxiety disorder: Secondary | ICD-10-CM | POA: Diagnosis not present

## 2023-02-08 DIAGNOSIS — F314 Bipolar disorder, current episode depressed, severe, without psychotic features: Secondary | ICD-10-CM | POA: Diagnosis not present

## 2023-02-16 DIAGNOSIS — R059 Cough, unspecified: Secondary | ICD-10-CM | POA: Diagnosis not present

## 2023-02-16 DIAGNOSIS — R0981 Nasal congestion: Secondary | ICD-10-CM | POA: Diagnosis not present

## 2023-02-18 DIAGNOSIS — F3112 Bipolar disorder, current episode manic without psychotic features, moderate: Secondary | ICD-10-CM | POA: Diagnosis not present

## 2023-02-18 DIAGNOSIS — F603 Borderline personality disorder: Secondary | ICD-10-CM | POA: Diagnosis not present

## 2023-03-04 DIAGNOSIS — F3112 Bipolar disorder, current episode manic without psychotic features, moderate: Secondary | ICD-10-CM | POA: Diagnosis not present

## 2023-03-04 DIAGNOSIS — F603 Borderline personality disorder: Secondary | ICD-10-CM | POA: Diagnosis not present

## 2023-03-11 DIAGNOSIS — F3112 Bipolar disorder, current episode manic without psychotic features, moderate: Secondary | ICD-10-CM | POA: Diagnosis not present

## 2023-03-11 DIAGNOSIS — F603 Borderline personality disorder: Secondary | ICD-10-CM | POA: Diagnosis not present

## 2023-03-12 DIAGNOSIS — F314 Bipolar disorder, current episode depressed, severe, without psychotic features: Secondary | ICD-10-CM | POA: Diagnosis not present

## 2023-03-12 DIAGNOSIS — F411 Generalized anxiety disorder: Secondary | ICD-10-CM | POA: Diagnosis not present

## 2023-03-12 DIAGNOSIS — F909 Attention-deficit hyperactivity disorder, unspecified type: Secondary | ICD-10-CM | POA: Diagnosis not present

## 2023-03-12 DIAGNOSIS — F4312 Post-traumatic stress disorder, chronic: Secondary | ICD-10-CM | POA: Diagnosis not present

## 2023-03-25 DIAGNOSIS — F3112 Bipolar disorder, current episode manic without psychotic features, moderate: Secondary | ICD-10-CM | POA: Diagnosis not present

## 2023-03-25 DIAGNOSIS — F603 Borderline personality disorder: Secondary | ICD-10-CM | POA: Diagnosis not present

## 2023-04-01 DIAGNOSIS — F3112 Bipolar disorder, current episode manic without psychotic features, moderate: Secondary | ICD-10-CM | POA: Diagnosis not present

## 2023-04-01 DIAGNOSIS — F603 Borderline personality disorder: Secondary | ICD-10-CM | POA: Diagnosis not present

## 2023-04-04 DIAGNOSIS — M65271 Calcific tendinitis, right ankle and foot: Secondary | ICD-10-CM | POA: Diagnosis not present

## 2023-04-04 DIAGNOSIS — M79671 Pain in right foot: Secondary | ICD-10-CM | POA: Diagnosis not present

## 2023-04-09 DIAGNOSIS — F909 Attention-deficit hyperactivity disorder, unspecified type: Secondary | ICD-10-CM | POA: Diagnosis not present

## 2023-04-09 DIAGNOSIS — F411 Generalized anxiety disorder: Secondary | ICD-10-CM | POA: Diagnosis not present

## 2023-04-09 DIAGNOSIS — F4312 Post-traumatic stress disorder, chronic: Secondary | ICD-10-CM | POA: Diagnosis not present

## 2023-04-09 DIAGNOSIS — F314 Bipolar disorder, current episode depressed, severe, without psychotic features: Secondary | ICD-10-CM | POA: Diagnosis not present

## 2023-04-19 NOTE — Progress Notes (Unsigned)
 New Patient Office Visit  Subjective   Patient ID: Cassandra Greer, female    DOB: 12/01/1975  Age: 48 y.o. MRN: [id number]  CC: No chief complaint on file.   HPI Cassandra Greer presents to establish care [employer] [facility]  Meds: seroquel 100/200, lipitor 20mg  adderall, glipizide, klonopin, amlodipine, hydrochlorothiazide, lisionpril, omepreazole  PMH: htn, obesity, anemia., panic disorder, anxiety, bipolar, adhd  PSH: ***  FH: ***  Tobacco use: *** Alcohol use: *** Drug use: *** Marital status: *** Employment: *** Sexual hx: ***  Screenings:  Colon Cancer: *** Lung Cancer: *** Breast Cancer: *** Diabetes: *** HLD: ***   Outpatient Encounter Medications as of 04/20/2023  Medication Sig   amLODipine (NORVASC) 5 MG tablet Take 10 mg by mouth daily.   atorvastatin (LIPITOR) 20 MG tablet Take 20 mg by mouth daily.   dicyclomine (BENTYL) 20 MG tablet Take 1 tablet (20 mg total) by mouth 2 (two) times daily.   famotidine (PEPCID) 20 MG tablet Take 1 tablet (20 mg total) by mouth 2 (two) times daily.   hydrochlorothiazide (HYDRODIURIL) 25 MG tablet Take 0.5 tablets (12.5 mg total) by mouth daily. (Patient taking differently: Take 6.25 mg by mouth daily. )   hydrocortisone (ANUSOL-HC) 2.5 % rectal cream Place 1 Application rectally 2 (two) times daily.   hydrocortisone (ANUSOL-HC) 25 MG suppository Place 1 suppository (25 mg total) rectally 2 (two) times daily.   hydrOXYzine (ATARAX/VISTARIL) 25 MG tablet Take 1 tablet (25 mg total) by mouth 3 (three) times daily as needed for anxiety.   ibuprofen (ADVIL) 800 MG tablet Take 1 tablet (800 mg total) by mouth every 8 (eight) hours as needed.   lisinopril (ZESTRIL) 40 MG tablet Take 40 mg by mouth daily.    medroxyPROGESTERone (PROVERA) 5 MG tablet Take 1 tablet (5 mg total) by mouth daily. Alternate 14 days taking pill, then 14 days off. Do this monthly   ondansetron (ZOFRAN) 4 MG tablet  Take 1 tablet (4 mg total) by mouth every 4 (four) hours as needed for nausea or vomiting.   pantoprazole (PROTONIX) 40 MG tablet Take 1 tablet (40 mg total) by mouth daily.   polyethylene glycol (MIRALAX / GLYCOLAX) packet Take 17 g by mouth daily as needed for mild constipation or moderate constipation.   QUEtiapine (SEROQUEL) 50 MG tablet Take 50-100 mg by mouth See admin instructions. 50mg  in am and 100mg  at night   simethicone (GAS-X) 80 MG chewable tablet Chew 1 tablet (80 mg total) by mouth every 6 (six) hours as needed for flatulence.   No facility-administered encounter medications on file as of 04/20/2023.    Past Medical History:  Diagnosis Date   Chest pain    Colitis    Hypertension    Marijuana abuse    Morbid obesity (HCC)    Panic attacks    Type 2 diabetes mellitus with hyperlipidemia (HCC) 12/22/2016    Past Surgical History:  Procedure Laterality Date   CHOLECYSTECTOMY N/A 02/21/2018   Procedure: LAPAROSCOPIC CHOLECYSTECTOMY WITH INTRAOPERATIVE CHOLANGIOGRAM;  Surgeon: Manus Rudd, MD;  Location: [facility] OR;  Service: General;  Laterality: N/A;   INSERTION OF MESH N/A 11/28/2019   Procedure: INSERTION OF MESH;  Surgeon: Harriette Bouillon, MD;  Location: [facility] OR;  Service: General;  Laterality: N/A;   TUBAL LIGATION     VENTRAL HERNIA REPAIR N/A 11/28/2019   Procedure: OPEN VENTRAL HERNIA REPAIR;  Surgeon: Harriette Bouillon, MD;  Location: [facility] OR;  Service:  General;  Laterality: N/A;    Family History  Problem Relation Age of Onset   Hypertension Other    Diabetes Mellitus II Other    Bipolar disorder Mother    Other Father        unaware of his PMH    Social History   Socioeconomic History   Marital status: Married    Spouse name: Not on file   Number of children: Not on file   Years of education: Not on file   Highest education level: Not on file  Occupational History   Not on file  Tobacco Use   Smoking status: Never   Smokeless tobacco: Never  Vaping  Use   Vaping status: Never Used  Substance and Sexual Activity   Alcohol use: No   Drug use: Yes    Types: Marijuana    Comment: every day   Sexual activity: Yes  Other Topics Concern   Not on file  Social History Narrative   Lives in [place] with husband and 2 children.  Has older children that have moved out.  Does not routinely exercise.   Social Drivers of Corporate investment banker Strain: Not on File (05/29/2021)   Received from [employer] [place] [employer] Strain    Financial Resource Strain: 0  Food Insecurity: Not at Risk (10/28/2022)   Received from [employer]    Food: 1  Transportation Needs: Not at Risk (10/28/2022)   Received from [employer] Needs    Transportation: 1  Physical Activity: Not on File (05/29/2021)   Received from [place], [place]   Physical Activity    Physical Activity: 0  Stress: Not on File (05/29/2021)   Received from [facility], [place]   Stress    Stress: 0  Social Connections: Not on File (10/28/2022)   Received from [facility]   Social Connections    Connectedness: 0  Intimate Partner Violence: Not At Risk (07/13/2022)   Humiliation, Afraid, Rape, and Kick questionnaire    Fear of Current or Ex-Partner: No    Emotionally Abused: No    Physically Abused: No    Sexually Abused: No    ROS     Objective   There were no vitals taken for this visit.  Physical Exam     Assessment & Plan:   There are no diagnoses linked to this encounter.  No follow-ups on file.   Sandre Kitty, MD

## 2023-04-20 ENCOUNTER — Encounter: Payer: Self-pay | Admitting: Family Medicine

## 2023-04-20 ENCOUNTER — Ambulatory Visit: Payer: Medicaid Other | Admitting: Family Medicine

## 2023-04-20 VITALS — BP 117/77 | HR 81 | Ht 66.0 in | Wt 218.8 lb

## 2023-04-20 DIAGNOSIS — F410 Panic disorder [episodic paroxysmal anxiety] without agoraphobia: Secondary | ICD-10-CM | POA: Diagnosis not present

## 2023-04-20 DIAGNOSIS — E785 Hyperlipidemia, unspecified: Secondary | ICD-10-CM | POA: Diagnosis not present

## 2023-04-20 DIAGNOSIS — E611 Iron deficiency: Secondary | ICD-10-CM

## 2023-04-20 DIAGNOSIS — R7303 Prediabetes: Secondary | ICD-10-CM | POA: Insufficient documentation

## 2023-04-20 DIAGNOSIS — K760 Fatty (change of) liver, not elsewhere classified: Secondary | ICD-10-CM | POA: Diagnosis not present

## 2023-04-20 DIAGNOSIS — Z7689 Persons encountering health services in other specified circumstances: Secondary | ICD-10-CM | POA: Diagnosis not present

## 2023-04-20 DIAGNOSIS — I159 Secondary hypertension, unspecified: Secondary | ICD-10-CM | POA: Diagnosis not present

## 2023-04-20 NOTE — Patient Instructions (Signed)
 It was nice to see you today,  We addressed the following topics today: -I am going to order some blood test for you.  I will wait on the results when I get them - You can send your refills for blood pressure and cholesterol medicine to [place] from now on - Pending your A1c I will likely ask you to stop taking the glipizide.  This can cause low blood sugars - We will have you follow-up in 6 months  Have a great day,  Frederic Jericho, MD

## 2023-04-20 NOTE — Assessment & Plan Note (Signed)
 Continue with amlodipine hydrochlorothiazide and lisinopril.  Blood pressure at goal today.

## 2023-04-20 NOTE — Assessment & Plan Note (Signed)
Get CMP today

## 2023-04-20 NOTE — Assessment & Plan Note (Signed)
 Rechecking cholesterol level today.  Continue atorvastatin.

## 2023-04-20 NOTE — Assessment & Plan Note (Signed)
 Patient has a diagnosis of diabetes in her chart but I do not see any A1c's in the diabetic range.  She is only taking glipizide.  Does endorse some hypoglycemic symptoms on a daily basis.  Advised her we will get an A1c and likely have her stop her glipizide.  Will need to work on getting her diabetes diagnosis removed from her chart if it is true she does not actually have diabetes.

## 2023-04-20 NOTE — Assessment & Plan Note (Signed)
 Currently taking lorazepam instead of clonazepam now.  Sees [place] [facility].  Goes to therapy as well.

## 2023-04-21 LAB — LIPID PANEL
Chol/HDL Ratio: 4.4 ratio (ref 0.0–4.4)
Cholesterol, Total: 202 mg/dL — ABNORMAL HIGH (ref 100–199)
HDL: 46 mg/dL (ref >39)
LDL Chol Calc (NIH): 141 mg/dL — ABNORMAL HIGH (ref 0–99)
Triglycerides: 82 mg/dL (ref 0–149)
VLDL Cholesterol Cal: 15 mg/dL (ref 5–40)

## 2023-04-21 LAB — CBC WITH DIFFERENTIAL/PLATELET
Basophils Absolute: 0.1 10*3/uL (ref 0.0–0.2)
Basos: 0 %
EOS (ABSOLUTE): 0.3 10*3/uL (ref 0.0–0.4)
Eos: 2 %
Hematocrit: 38.8 % (ref 34.0–46.6)
Hemoglobin: 12.5 g/dL (ref 11.1–15.9)
Immature Grans (Abs): 0 10*3/uL (ref 0.0–0.1)
Immature Granulocytes: 0 %
Lymphocytes Absolute: 2.3 10*3/uL (ref 0.7–3.1)
Lymphs: 20 %
MCH: 28.9 pg (ref 26.6–33.0)
MCHC: 32.2 g/dL (ref 31.5–35.7)
MCV: 90 fL (ref 79–97)
Monocytes Absolute: 0.7 10*3/uL (ref 0.1–0.9)
Monocytes: 6 %
Neutrophils Absolute: 7.8 10*3/uL — ABNORMAL HIGH (ref 1.4–7.0)
Neutrophils: 72 %
Platelets: 418 10*3/uL (ref 150–450)
RBC: 4.33 x10E6/uL (ref 3.77–5.28)
RDW: 14.1 % (ref 11.7–15.4)
WBC: 11.2 10*3/uL — ABNORMAL HIGH (ref 3.4–10.8)

## 2023-04-21 LAB — COMPREHENSIVE METABOLIC PANEL
ALT: 14 IU/L (ref 0–32)
AST: 14 IU/L (ref 0–40)
Albumin: 4.3 g/dL (ref 3.9–4.9)
Alkaline Phosphatase: 104 IU/L (ref 44–121)
BUN/Creatinine Ratio: 15 (ref 9–23)
BUN: 11 mg/dL (ref 6–24)
Bilirubin Total: 0.2 mg/dL (ref 0.0–1.2)
CO2: 26 mmol/L (ref 20–29)
Calcium: 9.1 mg/dL (ref 8.7–10.2)
Chloride: 102 mmol/L (ref 96–106)
Creatinine, Ser: 0.71 mg/dL (ref 0.57–1.00)
Globulin, Total: 2.5 g/dL (ref 1.5–4.5)
Glucose: 68 mg/dL — ABNORMAL LOW (ref 70–99)
Potassium: 4 mmol/L (ref 3.5–5.2)
Sodium: 141 mmol/L (ref 134–144)
Total Protein: 6.8 g/dL (ref 6.0–8.5)
eGFR: 106 mL/min/{1.73_m2} (ref >59)

## 2023-04-21 LAB — IRON,TIBC AND FERRITIN PANEL
Ferritin: 23 ng/mL (ref 15–150)
Iron Saturation: 10 % — ABNORMAL LOW (ref 15–55)
Iron: 30 ug/dL (ref 27–159)
Total Iron Binding Capacity: 307 ug/dL (ref 250–450)
UIBC: 277 ug/dL (ref 131–425)

## 2023-04-21 LAB — HEMOGLOBIN A1C
Est. average glucose Bld gHb Est-mCnc: 111 mg/dL
Hgb A1c MFr Bld: 5.5 % (ref 4.8–5.6)

## 2023-04-22 ENCOUNTER — Other Ambulatory Visit: Payer: Self-pay | Admitting: Family Medicine

## 2023-04-22 ENCOUNTER — Encounter: Payer: Self-pay | Admitting: Family Medicine

## 2023-04-22 MED ORDER — ATORVASTATIN CALCIUM 40 MG PO TABS: 40.0000 mg | ORAL_TABLET | Freq: Every day | ORAL | 1 refills | Status: DC

## 2023-04-28 DIAGNOSIS — F3112 Bipolar disorder, current episode manic without psychotic features, moderate: Secondary | ICD-10-CM | POA: Diagnosis not present

## 2023-04-28 DIAGNOSIS — F603 Borderline personality disorder: Secondary | ICD-10-CM | POA: Diagnosis not present

## 2023-05-05 DIAGNOSIS — F603 Borderline personality disorder: Secondary | ICD-10-CM | POA: Diagnosis not present

## 2023-05-05 DIAGNOSIS — F3112 Bipolar disorder, current episode manic without psychotic features, moderate: Secondary | ICD-10-CM | POA: Diagnosis not present

## 2023-05-14 DIAGNOSIS — F314 Bipolar disorder, current episode depressed, severe, without psychotic features: Secondary | ICD-10-CM | POA: Diagnosis not present

## 2023-05-14 DIAGNOSIS — F411 Generalized anxiety disorder: Secondary | ICD-10-CM | POA: Diagnosis not present

## 2023-05-14 DIAGNOSIS — F4312 Post-traumatic stress disorder, chronic: Secondary | ICD-10-CM | POA: Diagnosis not present

## 2023-05-14 DIAGNOSIS — F909 Attention-deficit hyperactivity disorder, unspecified type: Secondary | ICD-10-CM | POA: Diagnosis not present

## 2023-05-25 DIAGNOSIS — F603 Borderline personality disorder: Secondary | ICD-10-CM | POA: Diagnosis not present

## 2023-05-25 DIAGNOSIS — F3112 Bipolar disorder, current episode manic without psychotic features, moderate: Secondary | ICD-10-CM | POA: Diagnosis not present

## 2023-06-02 DIAGNOSIS — F603 Borderline personality disorder: Secondary | ICD-10-CM | POA: Diagnosis not present

## 2023-06-02 DIAGNOSIS — F3112 Bipolar disorder, current episode manic without psychotic features, moderate: Secondary | ICD-10-CM | POA: Diagnosis not present

## 2023-06-09 DIAGNOSIS — F3112 Bipolar disorder, current episode manic without psychotic features, moderate: Secondary | ICD-10-CM | POA: Diagnosis not present

## 2023-06-09 DIAGNOSIS — F603 Borderline personality disorder: Secondary | ICD-10-CM | POA: Diagnosis not present

## 2023-06-16 DIAGNOSIS — F603 Borderline personality disorder: Secondary | ICD-10-CM | POA: Diagnosis not present

## 2023-06-16 DIAGNOSIS — F3112 Bipolar disorder, current episode manic without psychotic features, moderate: Secondary | ICD-10-CM | POA: Diagnosis not present

## 2023-06-30 DIAGNOSIS — F603 Borderline personality disorder: Secondary | ICD-10-CM | POA: Diagnosis not present

## 2023-06-30 DIAGNOSIS — F3112 Bipolar disorder, current episode manic without psychotic features, moderate: Secondary | ICD-10-CM | POA: Diagnosis not present

## 2023-07-13 DIAGNOSIS — F603 Borderline personality disorder: Secondary | ICD-10-CM | POA: Diagnosis not present

## 2023-07-13 DIAGNOSIS — F3112 Bipolar disorder, current episode manic without psychotic features, moderate: Secondary | ICD-10-CM | POA: Diagnosis not present

## 2023-07-16 DIAGNOSIS — F909 Attention-deficit hyperactivity disorder, unspecified type: Secondary | ICD-10-CM | POA: Diagnosis not present

## 2023-07-16 DIAGNOSIS — F4312 Post-traumatic stress disorder, chronic: Secondary | ICD-10-CM | POA: Diagnosis not present

## 2023-07-16 DIAGNOSIS — F411 Generalized anxiety disorder: Secondary | ICD-10-CM | POA: Diagnosis not present

## 2023-07-16 DIAGNOSIS — F314 Bipolar disorder, current episode depressed, severe, without psychotic features: Secondary | ICD-10-CM | POA: Diagnosis not present

## 2023-08-04 DIAGNOSIS — F3112 Bipolar disorder, current episode manic without psychotic features, moderate: Secondary | ICD-10-CM | POA: Diagnosis not present

## 2023-08-04 DIAGNOSIS — F603 Borderline personality disorder: Secondary | ICD-10-CM | POA: Diagnosis not present

## 2023-08-18 DIAGNOSIS — F3112 Bipolar disorder, current episode manic without psychotic features, moderate: Secondary | ICD-10-CM | POA: Diagnosis not present

## 2023-08-18 DIAGNOSIS — F603 Borderline personality disorder: Secondary | ICD-10-CM | POA: Diagnosis not present

## 2023-09-08 DIAGNOSIS — F3112 Bipolar disorder, current episode manic without psychotic features, moderate: Secondary | ICD-10-CM | POA: Diagnosis not present

## 2023-09-08 DIAGNOSIS — F603 Borderline personality disorder: Secondary | ICD-10-CM | POA: Diagnosis not present

## 2023-09-22 DIAGNOSIS — F603 Borderline personality disorder: Secondary | ICD-10-CM | POA: Diagnosis not present

## 2023-09-22 DIAGNOSIS — F3112 Bipolar disorder, current episode manic without psychotic features, moderate: Secondary | ICD-10-CM | POA: Diagnosis not present

## 2023-10-06 ENCOUNTER — Encounter: Payer: Self-pay | Admitting: Family Medicine

## 2023-10-06 ENCOUNTER — Ambulatory Visit: Admitting: Family Medicine

## 2023-10-06 VITALS — BP 116/78 | HR 77 | Ht 66.0 in | Wt 198.1 lb

## 2023-10-06 DIAGNOSIS — L918 Other hypertrophic disorders of the skin: Secondary | ICD-10-CM | POA: Insufficient documentation

## 2023-10-06 DIAGNOSIS — I159 Secondary hypertension, unspecified: Secondary | ICD-10-CM

## 2023-10-06 DIAGNOSIS — E1169 Type 2 diabetes mellitus with other specified complication: Secondary | ICD-10-CM

## 2023-10-06 DIAGNOSIS — E785 Hyperlipidemia, unspecified: Secondary | ICD-10-CM | POA: Diagnosis not present

## 2023-10-06 NOTE — Assessment & Plan Note (Signed)
 Continue half tab glipizide 5mg .  Pt having fewer low blood glucose episodes.  Recheck A1c today

## 2023-10-06 NOTE — Progress Notes (Signed)
 Skin Tag Removal Procedure Note Diagnosis: painful and large skin tags Location: neck Informed Consent: Discussed risks (permanent scarring, infection, pain, bleeding, bruising, redness, and recurrence of the lesion) and benefits of the procedure, as well as the alternatives. She is aware that skin tags are benign lesions, and their removal is often not considered medically necessary. Informed consent was obtained. Preparation: The area was prepared in a standard fashion. Anesthesia: not required Procedure Details: dermablade was used to perform sharp removal. Direct pressure was applied for hemostasis. Ointment and bandage were applied where needed. The patient tolerated the procedure well. Total number of lesions treated: 1 Plan: The patient was instructed on post-op care. Recommend OTC analgesia as needed for pain.

## 2023-10-06 NOTE — Assessment & Plan Note (Signed)
-   Reports a skin tag that is frequently irritated, causing bleeding. Examination confirms a benign-appearing skin tag. - Shave removal of the skin tag performed in the office today. - Counseled that the lesion is a benign overgrowth of skin and does not require pathology. - Advised that the skin tag may recur in the future.

## 2023-10-06 NOTE — Progress Notes (Signed)
   Established Patient Office Visit  Subjective   Patient ID: Cassandra Greer, female    DOB: [birth year]  Age: 48 y.o. MRN: [id number]  Chief Complaint  Patient presents with   Medical Management of Chronic Issues    HPI  Subjective - Presents for evaluation of a skin lesion. It is described as a skin tag that frequently gets caught on the seatbelt and hair, causing it to rip and bleed significantly. A second lesion, a mole, is noted on the other side but is asymptomatic and will be left alone.  - Follow-up for chronic conditions. Reports no issues or side effects from current medications for hypertension. Reports no episodes of hypoglycemia unless a meal is delayed. Reports muscle aches and cramping since the increase in atorvastatin .  Medications Amlodipine , hydrochlorothiazide , and lisinopril  for hypertension. Atorvastatin  40 mg for high cholesterol. Glipizide, half a tablet, for blood sugar.  PMH, PSH, FH, Social Hx PMHx: Hypertension, hypercholesterolemia, type 2 diabetes mellitus.  ROS Integumentary: Reports bleeding skin tag. Denies issues with other moles. Musculoskeletal: Reports muscle aches and cramping. Endocrine: Denies symptoms of hypoglycemia except when meals are delayed. Cardiovascular: Denies side effects from blood pressure medications.    The 10-year ASCVD risk score (Arnett DK, et al., 2019) is: 2.7%  Health Maintenance Due  Topic Date Due   FOOT EXAM  Never done   OPHTHALMOLOGY EXAM  Never done   Diabetic kidney evaluation - Urine ACR  Never done   Hepatitis C Screening  Never done   DTaP/Tdap/Td (1 - Tdap) Never done   Hepatitis B Vaccines 19-59 Average Risk (1 of 3 - 19+ 3-dose series) Never done   COVID-19 Vaccine (5 - 2024-25 season) 10/11/2022   INFLUENZA VACCINE  09/10/2023      Objective:     BP 116/78   Pulse 77   Ht 5' 6 (1.676 m)   Wt 198 lb 1.9 oz (89.9 kg)   LMP 09/05/2023   SpO2 96%   BMI 31.98 kg/m    Physical  Exam Gen: alert, oriented Pulm: no resp distress SKIN: Pedunculated skin tag noted. No signs of infection or malignancy. A separate nevus is noted on the contralateral side, appearing benign.    No results found for any visits on 10/06/23.      Assessment & Plan:   Secondary hypertension Assessment & Plan: Bp well controlled on amloipine, lisinopril , hydrochlorothiazide .  No changes.   Orders: -     Comprehensive metabolic panel with GFR  Hyperlipidemia, unspecified hyperlipidemia type Assessment & Plan: Continue atorvastatin  40mg .  No side effects noted.  Recheck lipid panel today.   Orders: -     Lipid panel -     Comprehensive metabolic panel with GFR  Type 2 diabetes mellitus with hyperlipidemia (HCC) Assessment & Plan: Continue half tab glipizide 5mg .  Pt having fewer low blood glucose episodes.  Recheck A1c today  Orders: -     Hemoglobin A1c  Skin tag Assessment & Plan: - Reports a skin tag that is frequently irritated, causing bleeding. Examination confirms a benign-appearing skin tag. - Shave removal of the skin tag performed in the office today. - Counseled that the lesion is a benign overgrowth of skin and does not require pathology. - Advised that the skin tag may recur in the future.      Return in about 6 months (around 04/07/2024) for DM.    Toribio MARLA Slain, MD

## 2023-10-06 NOTE — Patient Instructions (Signed)
 It was nice to see you today,  We addressed the following topics today: -I will check your cholesterol and A1c.  If anything needs to be adjusted with your medications I will let you know - You can leave that bandage on for the next few hours and then when you take it off just wash it with soap and water regularly.  If you have any issues with pain or swelling or redness let us  know and I can reevaluate it.  Have a great day,  Rolan Slain, MD

## 2023-10-06 NOTE — Assessment & Plan Note (Signed)
 Continue atorvastatin  40mg .  No side effects noted.  Recheck lipid panel today.

## 2023-10-06 NOTE — Assessment & Plan Note (Signed)
 Bp well controlled on amloipine, lisinopril , hydrochlorothiazide .  No changes.

## 2023-10-07 ENCOUNTER — Ambulatory Visit: Payer: Self-pay | Admitting: Family Medicine

## 2023-10-07 ENCOUNTER — Telehealth: Payer: Self-pay | Admitting: *Deleted

## 2023-10-07 DIAGNOSIS — E1169 Type 2 diabetes mellitus with other specified complication: Secondary | ICD-10-CM

## 2023-10-07 LAB — COMPREHENSIVE METABOLIC PANEL WITH GFR
ALT: 12 IU/L (ref 0–32)
AST: 12 IU/L (ref 0–40)
Albumin: 4.1 g/dL (ref 3.9–4.9)
Alkaline Phosphatase: 97 IU/L (ref 44–121)
BUN/Creatinine Ratio: 12 (ref 9–23)
BUN: 10 mg/dL (ref 6–24)
Bilirubin Total: 0.3 mg/dL (ref 0.0–1.2)
CO2: 22 mmol/L (ref 20–29)
Calcium: 9.2 mg/dL (ref 8.7–10.2)
Chloride: 103 mmol/L (ref 96–106)
Creatinine, Ser: 0.85 mg/dL (ref 0.57–1.00)
Globulin, Total: 2.4 g/dL (ref 1.5–4.5)
Glucose: 72 mg/dL (ref 70–99)
Potassium: 4.2 mmol/L (ref 3.5–5.2)
Sodium: 140 mmol/L (ref 134–144)
Total Protein: 6.5 g/dL (ref 6.0–8.5)
eGFR: 85 mL/min/1.73 (ref >59)

## 2023-10-07 LAB — LIPID PANEL
Chol/HDL Ratio: 4.4 ratio (ref 0.0–4.4)
Cholesterol, Total: 149 mg/dL (ref 100–199)
HDL: 34 mg/dL — ABNORMAL LOW (ref >39)
LDL Chol Calc (NIH): 97 mg/dL (ref 0–99)
Triglycerides: 93 mg/dL (ref 0–149)
VLDL Cholesterol Cal: 18 mg/dL (ref 5–40)

## 2023-10-07 LAB — HEMOGLOBIN A1C
Est. average glucose Bld gHb Est-mCnc: 105 mg/dL
Hgb A1c MFr Bld: 5.3 % (ref 4.8–5.6)

## 2023-10-07 NOTE — Progress Notes (Signed)
 Complex Care Management Note  Care Guide Note 10/07/2023 Name: Cassandra Greer MRN: [id number] DOB: 25-May-1975  Cassandra Greer is a 48 y.o. year old female who sees Chandra Toribio POUR, MD for primary care. I reached out to [employer] by phone today to offer complex care management services.  Cassandra Greer was given information about Complex Care Management services today including:   The Complex Care Management services include support from the care team which includes your Nurse Care Manager, Clinical Social Worker, or Pharmacist.  The Complex Care Management team is here to help remove barriers to the health concerns and goals most important to you. Complex Care Management services are voluntary, and the patient may decline or stop services at any time by request to their care team member.   Complex Care Management Consent Status: Patient did not agree to participate in complex care management services at this time.  Follow up plan:  None   Encounter Outcome:  Patient Refused  Cassandra Greer  [facility]  [facility], [facility] Guide  Direct Dial: [phone]  Fax [phone]

## 2023-10-14 DIAGNOSIS — F3112 Bipolar disorder, current episode manic without psychotic features, moderate: Secondary | ICD-10-CM | POA: Diagnosis not present

## 2023-10-14 DIAGNOSIS — F603 Borderline personality disorder: Secondary | ICD-10-CM | POA: Diagnosis not present

## 2023-10-15 DIAGNOSIS — F411 Generalized anxiety disorder: Secondary | ICD-10-CM | POA: Diagnosis not present

## 2023-10-15 DIAGNOSIS — F909 Attention-deficit hyperactivity disorder, unspecified type: Secondary | ICD-10-CM | POA: Diagnosis not present

## 2023-10-15 DIAGNOSIS — F4312 Post-traumatic stress disorder, chronic: Secondary | ICD-10-CM | POA: Diagnosis not present

## 2023-10-15 DIAGNOSIS — F314 Bipolar disorder, current episode depressed, severe, without psychotic features: Secondary | ICD-10-CM | POA: Diagnosis not present

## 2023-10-21 ENCOUNTER — Ambulatory Visit: Admitting: Family Medicine

## 2023-11-11 DIAGNOSIS — F314 Bipolar disorder, current episode depressed, severe, without psychotic features: Secondary | ICD-10-CM | POA: Diagnosis not present

## 2023-11-11 DIAGNOSIS — F909 Attention-deficit hyperactivity disorder, unspecified type: Secondary | ICD-10-CM | POA: Diagnosis not present

## 2023-11-11 DIAGNOSIS — F4312 Post-traumatic stress disorder, chronic: Secondary | ICD-10-CM | POA: Diagnosis not present

## 2023-11-11 DIAGNOSIS — Z5181 Encounter for therapeutic drug level monitoring: Secondary | ICD-10-CM | POA: Diagnosis not present

## 2023-11-11 DIAGNOSIS — F411 Generalized anxiety disorder: Secondary | ICD-10-CM | POA: Diagnosis not present

## 2023-11-16 ENCOUNTER — Encounter: Payer: Self-pay | Admitting: Family

## 2023-11-16 ENCOUNTER — Other Ambulatory Visit: Payer: Self-pay | Admitting: Family Medicine

## 2023-11-16 DIAGNOSIS — Z1231 Encounter for screening mammogram for malignant neoplasm of breast: Secondary | ICD-10-CM

## 2023-11-18 DIAGNOSIS — F3112 Bipolar disorder, current episode manic without psychotic features, moderate: Secondary | ICD-10-CM | POA: Diagnosis not present

## 2023-11-18 DIAGNOSIS — F603 Borderline personality disorder: Secondary | ICD-10-CM | POA: Diagnosis not present

## 2023-12-03 ENCOUNTER — Ambulatory Visit
Admission: RE | Admit: 2023-12-03 | Discharge: 2023-12-03 | Disposition: A | Discharge status: Home / Self Care | Source: Ambulatory Visit | Attending: Family Medicine | Admitting: Family Medicine

## 2023-12-03 DIAGNOSIS — Z1231 Encounter for screening mammogram for malignant neoplasm of breast: Secondary | ICD-10-CM

## 2023-12-09 DIAGNOSIS — F3112 Bipolar disorder, current episode manic without psychotic features, moderate: Secondary | ICD-10-CM | POA: Diagnosis not present

## 2023-12-09 DIAGNOSIS — F4312 Post-traumatic stress disorder, chronic: Secondary | ICD-10-CM | POA: Diagnosis not present

## 2023-12-09 DIAGNOSIS — F314 Bipolar disorder, current episode depressed, severe, without psychotic features: Secondary | ICD-10-CM | POA: Diagnosis not present

## 2023-12-09 DIAGNOSIS — F603 Borderline personality disorder: Secondary | ICD-10-CM | POA: Diagnosis not present

## 2023-12-21 ENCOUNTER — Other Ambulatory Visit: Payer: Self-pay | Admitting: Family Medicine

## 2023-12-21 MED ORDER — AMLODIPINE BESYLATE 10 MG PO TABS: 10.0000 mg | ORAL_TABLET | Freq: Every day | ORAL | 1 refills | Status: DC

## 2023-12-21 MED ORDER — LISINOPRIL 40 MG PO TABS: 40.0000 mg | ORAL_TABLET | Freq: Every day | ORAL | 1 refills | Status: DC

## 2023-12-21 MED ORDER — GLIPIZIDE 5 MG PO TABS: 2.5000 mg | ORAL_TABLET | Freq: Every day | ORAL | 1 refills | Status: DC

## 2023-12-21 MED ORDER — HYDROCHLOROTHIAZIDE 25 MG PO TABS: 12.5000 mg | ORAL_TABLET | Freq: Every day | ORAL | 1 refills | Status: DC

## 2023-12-21 MED ORDER — ATORVASTATIN CALCIUM 40 MG PO TABS: 40.0000 mg | ORAL_TABLET | Freq: Every day | ORAL | 1 refills | Status: DC

## 2023-12-21 NOTE — Telephone Encounter (Signed)
 Copied from CRM [id number]. Topic: Clinical - Medication Refill >> Dec 21, 2023  8:19 AM Berwyn MATSU wrote: Medication: lisinopril  (ZESTRIL ) 40 MG tablet amLODipine  (NORVASC ) 5 MG tablet hydrochlorothiazide  (HYDRODIURIL ) 25 MG tablet atorvastatin  (LIPITOR) 40 MG tablet Glipizide 5mg  , half a tablet, for blood sugar.  Has the patient contacted their pharmacy? Yes (Agent: If no, request that the patient contact the pharmacy for the refill. If patient does not wish to contact the pharmacy document the reason why and proceed with request.) (Agent: If yes, when and what did the pharmacy advise?)  This is the patient's preferred pharmacy:  [facility], [place] - [address] [place] [address] Phone: [phone] Fax: [phone]  Is this the correct pharmacy for this prescription? Yes If no, delete pharmacy and type the correct one.   Has the prescription been filled recently? Yes  Is the patient out of the medication? Yes  Has the patient been seen for an appointment in the last year OR does the patient have an upcoming appointment? Yes  Can we respond through MyChart? Yes  Agent: Please be advised that Rx refills may take up to 3 business days. We ask that you follow-up with your pharmacy.

## 2023-12-29 DIAGNOSIS — F3112 Bipolar disorder, current episode manic without psychotic features, moderate: Secondary | ICD-10-CM | POA: Diagnosis not present

## 2023-12-29 DIAGNOSIS — F603 Borderline personality disorder: Secondary | ICD-10-CM | POA: Diagnosis not present

## 2024-01-13 DIAGNOSIS — F314 Bipolar disorder, current episode depressed, severe, without psychotic features: Secondary | ICD-10-CM | POA: Diagnosis not present

## 2024-01-13 DIAGNOSIS — F4312 Post-traumatic stress disorder, chronic: Secondary | ICD-10-CM | POA: Diagnosis not present

## 2024-01-13 DIAGNOSIS — F909 Attention-deficit hyperactivity disorder, unspecified type: Secondary | ICD-10-CM | POA: Diagnosis not present

## 2024-01-13 DIAGNOSIS — F3112 Bipolar disorder, current episode manic without psychotic features, moderate: Secondary | ICD-10-CM | POA: Diagnosis not present

## 2024-01-13 DIAGNOSIS — F603 Borderline personality disorder: Secondary | ICD-10-CM | POA: Diagnosis not present

## 2024-01-13 DIAGNOSIS — F411 Generalized anxiety disorder: Secondary | ICD-10-CM | POA: Diagnosis not present

## 2024-01-19 DIAGNOSIS — F3112 Bipolar disorder, current episode manic without psychotic features, moderate: Secondary | ICD-10-CM | POA: Diagnosis not present

## 2024-01-19 DIAGNOSIS — F603 Borderline personality disorder: Secondary | ICD-10-CM | POA: Diagnosis not present

## 2024-01-20 ENCOUNTER — Ambulatory Visit: Admitting: Family Medicine

## 2024-01-20 ENCOUNTER — Encounter: Payer: Self-pay | Admitting: Family Medicine

## 2024-01-20 VITALS — BP 126/86 | HR 87 | Ht 66.0 in | Wt 199.1 lb

## 2024-01-20 DIAGNOSIS — K5904 Chronic idiopathic constipation: Secondary | ICD-10-CM

## 2024-01-20 DIAGNOSIS — E1169 Type 2 diabetes mellitus with other specified complication: Secondary | ICD-10-CM | POA: Diagnosis not present

## 2024-01-20 DIAGNOSIS — Z23 Encounter for immunization: Secondary | ICD-10-CM | POA: Diagnosis not present

## 2024-01-20 DIAGNOSIS — E785 Hyperlipidemia, unspecified: Secondary | ICD-10-CM | POA: Diagnosis not present

## 2024-01-20 DIAGNOSIS — K5900 Constipation, unspecified: Secondary | ICD-10-CM | POA: Insufficient documentation

## 2024-01-20 MED ORDER — ATORVASTATIN CALCIUM 40 MG PO TABS: 40.0000 mg | ORAL_TABLET | Freq: Every day | ORAL | 3 refills | Status: AC

## 2024-01-20 MED ORDER — GLIPIZIDE 5 MG PO TABS: 2.5000 mg | ORAL_TABLET | Freq: Every day | ORAL | 3 refills | Status: AC

## 2024-01-20 MED ORDER — LISINOPRIL 40 MG PO TABS: 40.0000 mg | ORAL_TABLET | Freq: Every day | ORAL | 3 refills | Status: AC

## 2024-01-20 MED ORDER — HYDROCHLOROTHIAZIDE 25 MG PO TABS: 12.5000 mg | ORAL_TABLET | Freq: Every day | ORAL | 3 refills | Status: AC

## 2024-01-20 MED ORDER — AMLODIPINE BESYLATE 10 MG PO TABS: 10.0000 mg | ORAL_TABLET | Freq: Every day | ORAL | 3 refills | Status: AC

## 2024-01-20 NOTE — Patient Instructions (Signed)
 It was nice to see you today,  We addressed the following topics today: - You can start taking Miralax  (purple cap) now. Take two capfuls in 8 ounces of water. If you do not have a bowel movement in two days, increase the dose to three capfuls, and continue increasing by one capful each day until you have a bowel movement. - Once you are having regular bowel movements, you can adjust the dose down to find what works for you daily. Try not to go more than two days without a bowel movement. - You can stop taking the Metamucil for now. You can restart it for daily fiber once your current constipation has resolved. - For immediate relief, you can use Senna or a glycerin suppository instead of Colace. - Please keep your follow-up appointment with me in February.  Have a great day,  Rolan Slain, MD

## 2024-01-20 NOTE — Assessment & Plan Note (Addendum)
 Patient is due for annual diabetes monitoring. - Urine sample collected for microalbumin. - Foot exam performed today, no neuropathy. - Needs diabetic eye exam. Last exam was last year. Patient sees Dr. Jama at the [facility] on [address] [facility] Will attempt to obtain records. - Follow up in February as previously scheduled.

## 2024-01-20 NOTE — Assessment & Plan Note (Signed)
 Reports infrequent and very difficult, painful bowel movements, occurring 2-3 times per month. Has failed trials of Metamucil, Dulcolax, and Miralax  at a dose of every other day. Last bowel movement was 5 days ago. Exam is normal. - Discontinue Metamucil for now. - Start Miralax , titrating dose up. Begin with two capfuls in 8oz of water daily. - If no bowel movement after 2 days, increase by one capful daily until effective. - Once regular BMs are achieved, can titrate dose down to find maintenance dose. - Advised to not go more than 2 days without a bowel movement. - Counseled that Colace is not very effective. - For acute relief, can use Senna (Sennakot) or a glycerin suppository. - Once acute constipation is resolved, can restart Metamucil for daily fiber to soften stools. - If OTC measures are ineffective or Miralax  becomes too expensive, will consider prescription options such as lactulose or Linzess. Will review formulary for covered options.

## 2024-01-20 NOTE — Progress Notes (Signed)
 Established Patient Office Visit  Subjective   Patient ID: Cassandra Greer, female    DOB: 11-18-1975  Age: 48 y.o. MRN: [id number]  Chief Complaint  Patient presents with   Medical Management of Chronic Issues    HPI   Subjective - Here for medication refills. Was told by the pharmacy an appointment was required to get refills. Also reports new problem of severe constipation.  - Constipation: Reports bowel movements 2-3 times per month, for at least a week. Stool is very difficult to pass, causing significant pain and feeling of needing to pass out. Recently caused nausea. Describes sensation of stool being at the rectum but unable to be expelled. Last bowel movement was approximately 5 days ago after taking 3 Ex-Lax, which caused severe stomach cramps. Has tried Miralax  every other day and Dulcolax weekly without effect. Currently taking Metamucil (orange jar) without relief.  Medications Medications include amlodipine , glipizide , hydrochlorothiazide , enalapril, and atorvastatin . Does not need a refill for amlodipine . Glipizide  and hydrochlorothiazide  were filled on 12/21/2023. Enalapril and atorvastatin  need refills. Also takes psychiatric medications, but these are managed by [facility] and are not needed at this time.  PMH, PSH, FH, Social Hx PMHx: Hypertension, diabetes, constipation. PSHx: Colonoscopy 1-2 years ago, reported as normal. Social Hx: Drinks only water.  ROS Constitutional: No concerns. GI: Positive for constipation, pain with defecation, nausea. Neuro: Denies burning or tingling in feet.   The 10-year ASCVD risk score (Arnett DK, et al., 2019) is: 3.1%  Health Maintenance Due  Topic Date Due   OPHTHALMOLOGY EXAM  Never done   Diabetic kidney evaluation - Urine ACR  Never done   Hepatitis C Screening  Never done   Hepatitis B Vaccines 19-59 Average Risk (1 of 3 - 19+ 3-dose series) Never done   COVID-19 Vaccine (5 - 2025-26 season) 10/11/2023       Objective:     BP 126/86   Pulse 87   Ht 5' 6 (1.676 m)   Wt 199 lb 1.9 oz (90.3 kg)   LMP 12/22/2023   SpO2 95%   BMI 32.14 kg/m    Physical Exam General: No acute distress. EXTREMITIES: Foot exam performed. Sensation intact to monofilament testing bilaterally.   No results found for any visits on 01/20/24.      Assessment & Plan:   Type 2 diabetes mellitus with hyperlipidemia (HCC) Assessment & Plan: Patient is due for annual diabetes monitoring. - Urine sample collected for microalbumin. - Foot exam performed today, no neuropathy. - Needs diabetic eye exam. Last exam was last year. Patient sees Dr. Jama at the [facility] on [address] [facility] Will attempt to obtain records. - Follow up in February as previously scheduled.  Orders: -     Microalbumin / creatinine urine ratio  Flu vaccine need -     Flu vaccine trivalent PF, 6mos and older(Flulaval,Afluria,Fluarix,Fluzone)  Chronic idiopathic constipation Assessment & Plan: Reports infrequent and very difficult, painful bowel movements, occurring 2-3 times per month. Has failed trials of Metamucil, Dulcolax, and Miralax  at a dose of every other day. Last bowel movement was 5 days ago. Exam is normal. - Discontinue Metamucil for now. - Start Miralax , titrating dose up. Begin with two capfuls in 8oz of water daily. - If no bowel movement after 2 days, increase by one capful daily until effective. - Once regular BMs are achieved, can titrate dose down to find maintenance dose. - Advised to not go more than 2 days without a bowel  movement. - Counseled that Colace is not very effective. - For acute relief, can use Senna (Sennakot) or a glycerin suppository. - Once acute constipation is resolved, can restart Metamucil for daily fiber to soften stools. - If OTC measures are ineffective or Miralax  becomes too expensive, will consider prescription options such as lactulose or Linzess. Will review formulary for  covered options.   Other orders -     amLODIPine  Besylate; Take 1 tablet (10 mg total) by mouth daily.  Dispense: 90 tablet; Refill: 3 -     Atorvastatin  Calcium ; Take 1 tablet (40 mg total) by mouth daily.  Dispense: 90 tablet; Refill: 3 -     glipiZIDE ; Take 0.5 tablets (2.5 mg total) by mouth daily before breakfast.  Dispense: 45 tablet; Refill: 3 -     hydroCHLOROthiazide ; Take 0.5 tablets (12.5 mg total) by mouth daily.  Dispense: 45 tablet; Refill: 3 -     Lisinopril ; Take 1 tablet (40 mg total) by mouth daily.  Dispense: 90 tablet; Refill: 3     Return for on file already.    Toribio MARLA Slain, MD

## 2024-01-21 LAB — MICROALBUMIN / CREATININE URINE RATIO
Creatinine, Urine: 41.7 mg/dL
Microalb/Creat Ratio: <7 mg/g{creat} (ref 0–29)
Microalbumin, Urine: <3 ug/mL

## 2024-01-26 DIAGNOSIS — F603 Borderline personality disorder: Secondary | ICD-10-CM | POA: Diagnosis not present

## 2024-01-26 DIAGNOSIS — F3112 Bipolar disorder, current episode manic without psychotic features, moderate: Secondary | ICD-10-CM | POA: Diagnosis not present

## 2024-01-31 DIAGNOSIS — F603 Borderline personality disorder: Secondary | ICD-10-CM | POA: Diagnosis not present

## 2024-01-31 DIAGNOSIS — F3112 Bipolar disorder, current episode manic without psychotic features, moderate: Secondary | ICD-10-CM | POA: Diagnosis not present

## 2024-03-08 ENCOUNTER — Telehealth: Payer: Self-pay

## 2024-03-08 DIAGNOSIS — F3110 Bipolar disorder, current episode manic without psychotic features, unspecified: Secondary | ICD-10-CM

## 2024-03-08 NOTE — Progress Notes (Unsigned)
 Complex Care Management Note Care Guide Note  03/08/2024 Name: Cassandra Greer MRN: [id number] DOB: April 05, 1975   Complex Care Management Outreach Attempts: An unsuccessful telephone outreach was attempted today to offer the patient information about available complex care management services.  Follow Up Plan:  Additional outreach attempts will be made to offer the patient complex care management information and services.   Encounter Outcome:  No Answer  Dreama [facility]  [facility], [facility] VBCI Assistant Direct Dial: [phone]  Fax: [phone]

## 2024-03-09 NOTE — Progress Notes (Signed)
 Complex Care Management Note  Care Guide Note 03/09/2024 Name: Rani Idler MRN: [id number] DOB: Mar 24, 1975  Chikita Ziyonna Christner is a 49 y.o. year old female who sees Chandra Toribio POUR, MD for primary care. I reached out to [employer] by phone today to offer complex care management services.  Ms. Howland was given information about Complex Care Management services today including:   The Complex Care Management services include support from the care team which includes your Nurse Care Manager, Clinical Social Worker, or Pharmacist.  The Complex Care Management team is here to help remove barriers to the health concerns and goals most important to you. Complex Care Management services are voluntary, and the patient may decline or stop services at any time by request to their care team member.   Complex Care Management Consent Status: Patient did not agree to participate in complex care management services at this time.  Follow up plan:  Patient will follow up with PCP.  Encounter Outcome:  Patient Refused  Dreama Agent [facility], [facility] VBCI Assistant Direct Dial: [phone]  Fax: [phone]

## 2024-03-31 ENCOUNTER — Other Ambulatory Visit

## 2024-04-07 ENCOUNTER — Ambulatory Visit: Admitting: Family Medicine
